# Patient Record
Sex: Female | Born: 1970 | Race: White | Hispanic: No | Marital: Married | Smoking: Never smoker
Health system: Southern US, Community
[De-identification: ages and names within clinical notes are randomized; demographics above are authoritative.]

## PROBLEM LIST (undated history)

## (undated) DIAGNOSIS — J189 Pneumonia, unspecified organism: Secondary | ICD-10-CM

## (undated) DIAGNOSIS — Z9889 Other specified postprocedural states: Secondary | ICD-10-CM

## (undated) DIAGNOSIS — H539 Unspecified visual disturbance: Secondary | ICD-10-CM

## (undated) DIAGNOSIS — R112 Nausea with vomiting, unspecified: Secondary | ICD-10-CM

## (undated) HISTORY — DX: Unspecified visual disturbance: H53.9

## (undated) HISTORY — PX: HIP ARTHROSCOPY: SUR88

## (undated) HISTORY — PX: BREAST CYST ASPIRATION: SHX578

## (undated) HISTORY — PX: APPENDECTOMY: SHX54

---

## 2016-02-04 ENCOUNTER — Telehealth: Payer: Self-pay | Admitting: Neurology

## 2016-02-04 ENCOUNTER — Encounter: Payer: Self-pay | Admitting: Neurology

## 2016-02-04 ENCOUNTER — Ambulatory Visit (INDEPENDENT_AMBULATORY_CARE_PROVIDER_SITE_OTHER): Payer: Federal, State, Local not specified - PPO | Admitting: Neurology

## 2016-02-04 DIAGNOSIS — R4189 Other symptoms and signs involving cognitive functions and awareness: Secondary | ICD-10-CM | POA: Diagnosis not present

## 2016-02-04 DIAGNOSIS — R5383 Other fatigue: Secondary | ICD-10-CM | POA: Insufficient documentation

## 2016-02-04 DIAGNOSIS — R200 Anesthesia of skin: Secondary | ICD-10-CM | POA: Insufficient documentation

## 2016-02-04 NOTE — Telephone Encounter (Signed)
[facility]

## 2016-02-04 NOTE — Telephone Encounter (Signed)
Carrie/  [facility] called needs order faxed to them . Fax [phone], phone [phone]

## 2016-02-04 NOTE — Progress Notes (Signed)
[facility]  PATIENT: Sheena Jarvis DOB: 03-02-1971  REFERRING DOCTOR OR PCP:  Dr. Hebert Soho (Fax [phone]) SOURCE: Patient, husband, notes from Dr. Ty Hilts.   MRI images will be personally reviewed after they are performed later today.  _________________________________   HISTORICAL  CHIEF COMPLAINT:  Chief Complaint  Patient presents with  . Cognitive Disturbance    Sts. 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  Sts. she has right sided numbness, difficulty with balance. Husband sts. he has noticed she tends to bob her head. Looking back, she can pinpoint one other episode, in Jan 2017.  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  Sts. they were hiking in the heat at the time.  Weakness, fatigue resolved after a couple of days./fim    HISTORY OF PRESENT ILLNESS:  I had the pleasure seeing you patient, Sheena Jarvis, at [facility] for neurologic consultation regarding her cognitive disturbances and other neurologic concerns.  In January, when in [place], she had a few days of right leg weakness and fatigue.   She fell she completely recovered and was fine until more recently.   She had shingles over the summer.    She has reported a lot of fatigue since August.   She did a lot of travelling in [place], [place] and [place] and [place] in 2 trips the past few months.   After her second trip, she had much worse fatigue and was found to have anemia.   Most of the travel was in cities not in rural areas.   Most of her eating was in restaurants though she did have some street food in [place] and [place] in [place].   She had some abdominal discomfort and diarrhea for a day in [place].   She had no fevers.   She returned home and continued to have more fatigue than usual.  Since 01/15/16, she has felt worse and has had  reduced balance and right sided symptoms.    She recalls that morning she couldn't focus and felt confused having trouble operating the toaster.  The mental fog has persisted.  Then last week, she began to have numbness in her right side and her fatigue was even worse, prompting her to stay in bed. Currently,  numbness is in the right thigh, right shoulder region and right face.   She feels slightly weaker but in a nonspecific manner.   She denies any change in bladder.  Gait seems slower and she feels she is more guarded.   No actual falls.     She has had a resting tremor in her head.     Also, her vision worsened in August and she needed other people to read things off in the distance.     She has taken some supplements for anemia.    She has been seeing a Functional Medicine doctor lately and was placed on supplements but no chelation or other therapies.    She has seen her eye doctor who made minimal correction changes but felt her eyes were otherwise unchanged.    She has high normal intraocular pressures but is not on glaucoma drops.     She had a deer tick in August and had a small rash at the bite but no rash elsewhere.    Lyme titer testing was negative in July.  She has not had a brain MRI yet.   I reviewed labs form her PCP.  EBV showed history of infection in the past, Lyme was negative. HSV was negative..   She is being evaluated for gallbladder issues.    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      REVIEW OF SYSTEMS: Constitutional: No fevers, chills, sweats, or change in appetite Eyes: No visual changes, double vision, eye pain.   She has borderline glaucoma Ear, nose and throat: No hearing loss, ear pain, nasal congestion, sore throat Cardiovascular: No chest pain, palpitations.   She had SVT in the past.   Respiratory: No shortness of breath at rest or with exertion.   No wheezes GastrointestinaI: No nausea, vomiting, diarrhea,  abdominal pain, fecal incontinence Genitourinary: No dysuria, urinary retention or frequency.  No nocturia. Musculoskeletal: No neck pain, back pain Integumentary: No rash, pruritus, skin lesions Neurological: as above Psychiatric: No depression at this time.  No anxiety Endocrine: No palpitations, diaphoresis, change in appetite, change in weigh or increased thirst Hematologic/Lymphatic: No anemia, purpura, petechiae. Allergic/Immunologic: No itchy/runny eyes, nasal congestion, recent allergic reactions, rashes  ALLERGIES: Not on File  HOME MEDICATIONS:  Current Outpatient Prescriptions:  .  Ascorbic Acid (VITAMIN C) POWD, Take 2,569 mg by mouth., Disp: , Rfl:  .  DIGESTIVE ENZYMES PO, Take by mouth., Disp: , Rfl:  .  FERROUS BISGLYCINATE CHELATE PO, Take by mouth., Disp: , Rfl:  .  MAGNESIUM MALATE PO, Take by mouth., Disp: , Rfl:  .  medium chain triglycerides oil (MCT OIL) OIL, Take by mouth., Disp: , Rfl:  .  Milk Thistle 175 MG CAPS, Take by mouth., Disp: , Rfl:  .  Multiple Vitamin (MULTI-VITAMINS) TABS, Take by mouth., Disp: , Rfl:  .  OVER THE COUNTER MEDICATION, Take by mouth., Disp: , Rfl:  .  Resveratrol 250 MG CAPS, Take by mouth., Disp: , Rfl:  .  TURMERIC PO, Take by mouth., Disp: , Rfl:   PAST MEDICAL HISTORY: No past medical history on file.  PAST SURGICAL HISTORY: No past surgical history on file.  FAMILY HISTORY: No family history on file.  SOCIAL HISTORY:  Social History   Social History  . Marital status: Married    Spouse name: N/A  . Number of children: N/A  . Years of education: N/A   Occupational History  . Not on file.   Social History Main Topics  . Smoking status: Not on file  . Smokeless tobacco: Not on file  . Alcohol use Not on file  . Drug use: Unknown  . Sexual activity: Not on file   Other Topics Concern  . Not on file   Social History Narrative  . No narrative on file     PHYSICAL EXAM  Vitals:   02/04/16 1400    BP: 110/64  Pulse: 64  Resp: 14  Weight: 121 lb 8 oz (55.1 kg)  Height: 5\' 6"  (1.676 m)    Body mass index is 19.61 kg/m.   General: The patient is well-developed and well-nourished and in no acute distress  Eyes:  Funduscopic exam shows normal optic discs and retinal vessels.  Neck: The neck is supple, no carotid bruits are noted.  The neck is nontender.  Cardiovascular: The heart has a regular rate and rhythm with a normal S1 and S2. There were no murmurs, gallops or rubs. Lungs are clear to auscultation.  Skin: Extremities are  without significant edema.  Musculoskeletal:  Back is nontender  Neurologic Exam  Mental status: The patient is alert and oriented x 3 at the time of the examination. The patient has apparent normal recent and remote memory, with an apparently normal attention span and concentration ability.   Speech is normal.  Cranial nerves: Extraocular movements are full. Pupils are equal, round, and reactive to light and accomodation.  Visual fields are full.  Facial symmetry is present. There is good facial sensation to soft touch bilaterally.Facial strength is normal.  Trapezius and sternocleidomastoid strength is normal. No dysarthria is noted.  The tongue is midline, and the patient has symmetric elevation of the soft palate. No obvious hearing deficits are noted.  Motor:  Head bob tremor but not in hands.   Muscle bulk is normal.   Tone is normal. Strength is  5 / 5 in all 4 extremities.   Sensory: Sensory testing is intact to vibration sensation in all 4 extremities.   She has decreased touch sensation in the anterolateral right thigh and also in the right shoulder region.  Coordination: Cerebellar testing reveals good finger-nose-finger and heel-to-shin bilaterally.  Gait and station: Station is normal.   Gait is normal. Tandem gait is mildly wide. Romberg is negative.   Reflexes: Deep tendon reflexes are 3+ and symmetric bilaterally.   Plantar responses  are flexor.    DIAGNOSTIC DATA (LABS, IMAGING, TESTING) - I reviewed patient records, labs, notes, testing and imaging myself where available.      ASSESSMENT AND PLAN  Numbness - Plan: Lyme disease, western blot, Heavy Metals Profile, Urine, CBC with Differential/Platelet, MR Brain W Wo Contrast  Cognitive changes - Plan: Lyme disease, western blot, Heavy Metals Profile, Urine, CBC with Differential/Platelet, MR Brain W Wo Contrast  Other fatigue - Plan: Lyme disease, western blot, Heavy Metals Profile, Urine, CBC with Differential/Platelet   In summary, Sheena Jarvis is a 45 year old woman who had the recent onset of right-sided numbness who has also noted an increased "mental fog" over the last 2 weeks. Given her very strong family history of MS, and her symptoms, we need to check an MRI of the brain with and without contrast to make sure that there is not evidence of demyelination. Additionally, with her traveling this will help to rule out some ID causes of neurologic symptoms.    Additionally because of the cognitive changes, fatigue and anemia we will check 24-hour urine for heavy metals. She had a tic bite several months ago and I will check a Lyme titer western blot.     We will try to set up the MRI to be done in the next day or 2 and further evaluation (such as lumbar puncture) may be necessary based on results. Her tremor is mild and probably does not have to be treated at this point. However, if the evaluation is negative and she wishes to try a medication we can initiate low-dose beta blocker.     Follow-up will be better based on the results of the studies.  Thank you for asking me to see Sheena Jarvis for a neurologic consultation. Please let me know if I can be of further assistance with her or other patients in the future.   Bettymae Yott A. Epimenio Foot, MD, PhD 02/04/2016, 2:08 PM Certified in Neurology, Clinical Neurophysiology, Sleep Medicine, Pain Medicine and  Neuroimaging  [facility] [address] [place], [place] [address] [phone]

## 2016-02-05 ENCOUNTER — Telehealth: Payer: Self-pay | Admitting: *Deleted

## 2016-02-05 ENCOUNTER — Telehealth: Payer: Self-pay | Admitting: Neurology

## 2016-02-05 MED ORDER — AZITHROMYCIN 250 MG PO TABS: ORAL_TABLET | ORAL | 0 refills | Status: DC

## 2016-02-05 NOTE — Telephone Encounter (Signed)
I have spoken with Sheena Jarvis this morning, and per RAS, advised that MRI brain done yesterday shows no signs of MS.  The only noted abnormality is some sinusitis.  She verbalized understanding of same.  Per RAS, I offered a Z-pack to treat sinusitis if she is symptomatic.  [facility] [place]  Rx. has been escribed/fim

## 2016-02-05 NOTE — Progress Notes (Signed)
NOTES FROM TODAY'S OV HAVE BEEN FAXED TO DR. BIANCA ROSSO FAX# [phone], WITH FAX CONFIRMATION RECEIVED/fim

## 2016-02-05 NOTE — Telephone Encounter (Signed)
I have spoken with Terri at [facility].  She sts. pt's MRI was normal; sts. images should be available in PACS for RAS to view/fim

## 2016-02-05 NOTE — Telephone Encounter (Signed)
Pt's husband called said he is wanting to know if the urine sample could be dropped [place]. Faith was skyped and relayed [facility]. He also had questions about pt's MRI and Faith was able to take the call

## 2016-02-05 NOTE — Telephone Encounter (Signed)
I have spoken with pt's husband and advised 24 hr. urine should be dropped off in our office/fim

## 2016-02-06 LAB — LYME DISEASE, WESTERN BLOT
IGG P30 AB.: ABSENT
IGG P45 AB.: ABSENT
IGG P58 AB.: ABSENT
IGG P66 AB.: ABSENT
IGG P93 AB.: ABSENT
IGM P23 AB.: ABSENT
IgG P18 Ab.: ABSENT
IgG P23 Ab.: ABSENT
IgG P28 Ab.: ABSENT
IgG P39 Ab.: ABSENT
IgG P41 Ab.: ABSENT
IgM P39 Ab.: ABSENT
IgM P41 Ab.: ABSENT
Lyme IgG Wb: NEGATIVE
Lyme IgM Wb: NEGATIVE

## 2016-02-06 LAB — CBC WITH DIFFERENTIAL/PLATELET
BASOS: 0 %
Basophils Absolute: 0 10*3/uL (ref 0.0–0.2)
EOS (ABSOLUTE): 0.4 10*3/uL (ref 0.0–0.4)
EOS: 5 %
HEMATOCRIT: 38.9 % (ref 34.0–46.6)
HEMOGLOBIN: 11.9 g/dL (ref 11.1–15.9)
IMMATURE GRANULOCYTES: 0 %
Immature Grans (Abs): 0 10*3/uL (ref 0.0–0.1)
Lymphocytes Absolute: 1.3 10*3/uL (ref 0.7–3.1)
Lymphs: 18 %
MCH: 25.5 pg — ABNORMAL LOW (ref 26.6–33.0)
MCHC: 30.6 g/dL — AB (ref 31.5–35.7)
MCV: 84 fL (ref 79–97)
MONOCYTES: 5 %
MONOS ABS: 0.4 10*3/uL (ref 0.1–0.9)
NEUTROS PCT: 72 %
Neutrophils Absolute: 5.5 10*3/uL (ref 1.4–7.0)
Platelets: 204 10*3/uL (ref 150–379)
RBC: 4.66 x10E6/uL (ref 3.77–5.28)
RDW: 22.2 % — ABNORMAL HIGH (ref 12.3–15.4)
WBC: 7.6 10*3/uL (ref 3.4–10.8)

## 2016-02-07 ENCOUNTER — Encounter: Payer: Self-pay | Admitting: Neurology

## 2016-02-08 ENCOUNTER — Other Ambulatory Visit: Payer: Self-pay | Admitting: Neurology

## 2016-02-08 ENCOUNTER — Telehealth: Payer: Self-pay | Admitting: *Deleted

## 2016-02-08 LAB — HEAVY METALS PROFILE, URINE
ARSENIC UR: NOT DETECTED ug/L (ref 0–50)
ARSENIC(INORGANIC), U: NOT DETECTED ug/L (ref 0–19)
Creatinine(Crt),U: 0.56 g/L (ref 0.30–3.00)
LEAD RANDOM URINE: NOT DETECTED ug/L (ref 0–49)
Mercury, Ur: NOT DETECTED ug/L (ref 0–19)

## 2016-02-08 MED ORDER — METOPROLOL SUCCINATE ER 25 MG PO TB24: 25.0000 mg | ORAL_TABLET | Freq: Every day | ORAL | 3 refills | Status: DC

## 2016-02-08 NOTE — Telephone Encounter (Signed)
Results emailed to pt. via mychart/fim

## 2016-02-08 NOTE — Telephone Encounter (Signed)
-----   Message from Asa Lenteichard A Sater, MD sent at 02/08/2016  8:24 AM EDT ----- We got her results. The Lyme titer was negative. The heavy metal profile was negative.      We can mail her or fax to her the results so she can share with her other doctors.  If she wants to treat the tremor, we can try metoprolol 25 mg daily

## 2018-01-14 ENCOUNTER — Other Ambulatory Visit: Payer: Self-pay | Admitting: Obstetrics

## 2018-01-14 DIAGNOSIS — N644 Mastodynia: Secondary | ICD-10-CM

## 2018-01-25 ENCOUNTER — Other Ambulatory Visit: Payer: Self-pay

## 2018-02-02 ENCOUNTER — Ambulatory Visit
Admission: RE | Admit: 2018-02-02 | Discharge: 2018-02-02 | Disposition: A | Discharge status: Home / Self Care | Payer: Federal, State, Local not specified - PPO | Source: Ambulatory Visit | Attending: Obstetrics | Admitting: Obstetrics

## 2018-02-02 DIAGNOSIS — N644 Mastodynia: Secondary | ICD-10-CM

## 2018-05-10 ENCOUNTER — Encounter (HOSPITAL_COMMUNITY): Payer: Self-pay | Admitting: Emergency Medicine

## 2018-05-10 ENCOUNTER — Ambulatory Visit (HOSPITAL_COMMUNITY)
Admission: EM | Admit: 2018-05-10 | Discharge: 2018-05-10 | Disposition: A | Discharge status: Home / Self Care | Payer: Federal, State, Local not specified - PPO | Attending: Internal Medicine | Admitting: Internal Medicine

## 2018-05-10 DIAGNOSIS — J019 Acute sinusitis, unspecified: Secondary | ICD-10-CM

## 2018-05-10 MED ORDER — DOXYCYCLINE HYCLATE 100 MG PO CAPS: 100.0000 mg | ORAL_CAPSULE | Freq: Two times a day (BID) | ORAL | 0 refills | Status: AC

## 2018-05-10 MED ORDER — BENZONATATE 200 MG PO CAPS: 200.0000 mg | ORAL_CAPSULE | Freq: Three times a day (TID) | ORAL | 0 refills | Status: AC | PRN

## 2018-05-10 MED ORDER — PREDNISONE 50 MG PO TABS: 50.0000 mg | ORAL_TABLET | Freq: Every day | ORAL | 0 refills | Status: AC

## 2018-05-10 NOTE — ED Provider Notes (Signed)
[facility]    CSN: [id number] Arrival date & time: 05/10/18  1306     History   Chief Complaint Chief Complaint  Patient presents with  . URI    appt 1315    HPI Sheena Ladell HeadsRachelle Brocious is a 48 y.o. female no contributing past medical history presenting today for evaluation of URI symptoms.  Patient states that she has had nasal congestion and cough.  Symptoms have been going on for approximately 1 month.  She has been using over-the-counter remedies without relief which have included Nettie pot, hot tea, Mucinex, NSAIDs, Zyrtec.  She has developed laryngitis in has lost her voice.  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  She denies any fevers.  Her son is sick with pneumonia at this time.  HPI  Past Medical History:  Diagnosis Date  . Vision abnormalities     Patient Active Problem List   Diagnosis Date Noted  . Numbness 02/04/2016  . Cognitive changes 02/04/2016  . Other fatigue 02/04/2016    Past Surgical History:  Procedure Laterality Date  . APPENDECTOMY    . BREAST CYST ASPIRATION Left   . HIP ARTHROSCOPY      OB History   No obstetric history on file.      Home Medications    Prior to Admission medications   Medication Sig Start Date End Date Taking? Authorizing Provider  Ascorbic Acid (VITAMIN C) POWD Take 2,569 mg by mouth.    [provider]  azithromycin (ZITHROMAX Z-PAK) 250 MG tablet Take 2 tablets on day 1, then 1 tablet daily until gone. Patient not taking: Reported on 05/10/2018 02/05/16   Sater, Pearletha Furlichard A, MD  benzonatate (TESSALON) 200 MG capsule Take 1 capsule (200 mg total) by mouth 3 (three) times daily as needed for up to 7 days for cough. 05/10/18 05/17/18  ,  C, PA-C  DIGESTIVE ENZYMES PO Take by mouth.    [provider]  doxycycline (VIBRAMYCIN) 100 MG capsule Take 1 capsule (100 mg total) by mouth 2 (two) times daily for 10 days. 05/10/18 05/20/18   ,  C, PA-C  FERROUS BISGLYCINATE CHELATE PO Take by mouth.    [provider]  MAGNESIUM MALATE PO Take by mouth.    [provider]  medium chain triglycerides oil (MCT OIL) OIL Take by mouth.    [provider]  metoprolol succinate (TOPROL XL) 25 MG 24 hr tablet Take 1 tablet (25 mg total) by mouth daily. 02/08/16   Sater, Pearletha Furlichard A, MD  Milk Thistle 175 MG CAPS Take by mouth.    [provider]  Multiple Vitamin (MULTI-VITAMINS) TABS Take by mouth.    [provider]  OVER THE COUNTER MEDICATION Take by mouth.    [provider]  predniSONE (DELTASONE) 50 MG tablet Take 1 tablet (50 mg total) by mouth daily for 5 days. 05/10/18 05/15/18  ,  C, PA-C  Resveratrol 250 MG CAPS Take by mouth.    [provider]  TURMERIC PO Take by mouth.    [provider]    Family History Family History  Problem Relation Age of Onset  . Healthy Mother   . Heart disease Father   . Multiple sclerosis Brother   . Multiple sclerosis Maternal Aunt   . Breast cancer Maternal Grandmother     Social History Social History   Tobacco Use  . Smoking status: Never  Smoker  . Smokeless tobacco: Never Used  Substance Use Topics  . Alcohol use: Yes    Alcohol/week: 3.0 - 4.0 standard drinks    Types: 3 - 4 Glasses of wine per week  . Drug use: No     Allergies   Penicillins; Latex; and Sulfa antibiotics   Review of Systems Review of Systems  Constitutional: Positive for fatigue. Negative for activity change, appetite change, chills and fever.  HENT: Positive for congestion, rhinorrhea, sinus pressure and voice change. Negative for ear pain, sore throat and trouble swallowing.   Eyes: Negative for discharge and redness.  Respiratory: Positive for cough and chest tightness. Negative for shortness of breath.   Cardiovascular: Negative for chest pain.  Gastrointestinal: Negative for abdominal pain, diarrhea,  nausea and vomiting.  Musculoskeletal: Negative for myalgias.  Skin: Negative for rash.  Neurological: Negative for dizziness, light-headedness and headaches.     Physical Exam Triage Vital Signs ED Triage Vitals  Enc Vitals Group     BP 05/10/18 1331 117/72     Pulse Rate 05/10/18 1331 74     Resp 05/10/18 1331 18     Temp 05/10/18 1331 98.4 F (36.9 C)     Temp Source 05/10/18 1331 Oral     SpO2 05/10/18 1331 100 %     Weight --      Height --      Head Circumference --      Peak Flow --      Pain Score 05/10/18 1402 5     Pain Loc --      Pain Edu? --      Excl. in GC? --    No data found.  Updated Vital Signs BP 117/72 (BP Location: Right Arm)   Pulse 74   Temp 98.4 F (36.9 C) (Oral)   Resp 18   SpO2 100%   Visual Acuity Right Eye Distance:   Left Eye Distance:   Bilateral Distance:    Right Eye Near:   Left Eye Near:    Bilateral Near:     Physical Exam Vitals signs and nursing note reviewed.  Constitutional:      General: She is not in acute distress.    Appearance: She is well-developed.  HENT:     Head: Normocephalic and atraumatic.     Ears:     Comments: Bilateral ears without tenderness to palpation of external auricle, tragus and mastoid, EAC's without erythema or swelling, TM's with good bony landmarks and cone of light. Non erythematous.    Nose:     Comments: Nasal mucosa erythematous    Mouth/Throat:     Comments: Oral mucosa pink and moist, no tonsillar enlargement or exudate. Posterior pharynx patent and nonerythematous, no uvula deviation or swelling.  Hoarse voice Eyes:     Conjunctiva/sclera: Conjunctivae normal.  Neck:     Musculoskeletal: Neck supple.  Cardiovascular:     Rate and Rhythm: Normal rate and regular rhythm.     Heart sounds: No murmur.  Pulmonary:     Effort: Pulmonary effort is normal. No respiratory distress.     Breath sounds: Normal breath sounds.     Comments: Breathing comfortably at rest, CTABL, no  wheezing, rales or other adventitious sounds auscultated Abdominal:     Palpations: Abdomen is soft.     Tenderness: There is no abdominal tenderness.  Skin:    General: Skin is warm and dry.  Neurological:     Mental Status: She is  alert.      UC Treatments / Results  Labs (all labs ordered are listed, but only abnormal results are displayed) Labs Reviewed - No data to display  EKG None  Radiology No results found.  Procedures Procedures (including critical care time)  Medications Ordered in UC Medications - No data to display  Initial Impression / Assessment and Plan / UC Course  I have reviewed the triage vital signs and the nursing notes.  Pertinent labs & imaging results that were available during my care of the patient were reviewed by me and considered in my medical decision making (see chart for details).     URI symptoms x1 month, will treat for sinusitis.  Penicillin allergy, will provide doxycycline.  Continue Mucinex, Zyrtec.  Add in [place] as needed for cough.  5-day course of prednisone to help with inflammation in sinuses.  Push fluids, rest.Discussed strict return precautions. Patient verbalized understanding and is agreeable with plan.  Final Clinical Impressions(s) / UC Diagnoses   Final diagnoses:  Acute sinusitis with symptoms > 10 days     Discharge Instructions     Please begin doxycycline twice daily for the next 10 days Please use Tessalon as needed every 8 hours for cough Prednisone daily for the next 5 days.with inflammation in sinuses contributing to symptoms You may continue other over-the-counter medicines to further help with your congestion and headache   ED Prescriptions    Medication Sig Dispense Auth. Provider   doxycycline (VIBRAMYCIN) 100 MG capsule Take 1 capsule (100 mg total) by mouth 2 (two) times daily for 10 days. 20 capsule ,  C, PA-C   predniSONE (DELTASONE) 50 MG tablet Take 1 tablet (50 mg total) by  mouth daily for 5 days. 5 tablet ,  C, PA-C   benzonatate (TESSALON) 200 MG capsule Take 1 capsule (200 mg total) by mouth 3 (three) times daily as needed for up to 7 days for cough. 28 capsule ,  C, PA-C     Controlled Substance Prescriptions [place] Controlled Substance Registry consulted? Not Applicable   Lew Dawes, [place] 05/10/18 1523

## 2018-05-10 NOTE — Discharge Instructions (Signed)
Please begin doxycycline twice daily for the next 10 days Please use Tessalon as needed every 8 hours for cough Prednisone daily for the next 5 days.with inflammation in sinuses contributing to symptoms You may continue other over-the-counter medicines to further help with your congestion and headache

## 2018-05-10 NOTE — ED Triage Notes (Signed)
Pt here for URI sx x 1 month

## 2018-05-12 ENCOUNTER — Encounter (HOSPITAL_COMMUNITY): Payer: Self-pay

## 2018-05-12 ENCOUNTER — Ambulatory Visit (HOSPITAL_COMMUNITY)
Admission: EM | Admit: 2018-05-12 | Discharge: 2018-05-12 | Disposition: A | Discharge status: Home / Self Care | Payer: Federal, State, Local not specified - PPO | Attending: Family Medicine | Admitting: Family Medicine

## 2018-05-12 DIAGNOSIS — R05 Cough: Secondary | ICD-10-CM | POA: Insufficient documentation

## 2018-05-12 DIAGNOSIS — J0140 Acute pansinusitis, unspecified: Secondary | ICD-10-CM | POA: Diagnosis not present

## 2018-05-12 DIAGNOSIS — R059 Cough, unspecified: Secondary | ICD-10-CM

## 2018-05-12 MED ORDER — ALBUTEROL SULFATE HFA 108 (90 BASE) MCG/ACT IN AERS: 1.0000 | INHALATION_SPRAY | Freq: Four times a day (QID) | RESPIRATORY_TRACT | 0 refills | Status: DC | PRN

## 2018-05-12 MED ORDER — HYDROCODONE-HOMATROPINE 5-1.5 MG/5ML PO SYRP: 5.0000 mL | ORAL_SOLUTION | Freq: Four times a day (QID) | ORAL | 0 refills | Status: DC | PRN

## 2018-05-12 MED ORDER — FLUTICASONE PROPIONATE 50 MCG/ACT NA SUSP: 2.0000 | Freq: Every day | NASAL | 0 refills | Status: DC

## 2018-05-12 MED ORDER — IPRATROPIUM BROMIDE 0.06 % NA SOLN: 2.0000 | Freq: Four times a day (QID) | NASAL | 0 refills | Status: DC

## 2018-05-12 NOTE — ED Provider Notes (Signed)
[facility]    CSN: [id number] Arrival date & time: 05/12/18  1002     History   Chief Complaint Chief Complaint  Patient presents with  . Cough    HPI Sheena Jarvis is a 48 y.o. female.   48 year old female comes in with increased cough and shortness of breath after being seen 2 days ago for sinusitis.  States she has started doxycycline and prednisone with good relief.  However, for the past few nights, woke up feeling like she cannot breathe.  States she tried to use her son's inhaler, but they realize it is expired.  She has not noticed any wheezing, and does not feel short of breath during the day.  She denies fever, chills, night sweats.  Still has rhinorrhea, nasal congestion.  Has never needed albuterol inhalers.  Never smoker.     Past Medical History:  Diagnosis Date  . Vision abnormalities     Patient Active Problem List   Diagnosis Date Noted  . Numbness 02/04/2016  . Cognitive changes 02/04/2016  . Other fatigue 02/04/2016    Past Surgical History:  Procedure Laterality Date  . APPENDECTOMY    . BREAST CYST ASPIRATION Left   . HIP ARTHROSCOPY      OB History   No obstetric history on file.      Home Medications    Prior to Admission medications   Medication Sig Start Date End Date Taking? Authorizing Provider  albuterol (PROVENTIL HFA;VENTOLIN HFA) 108 (90 Base) MCG/ACT inhaler Inhale 1-2 puffs into the lungs every 6 (six) hours as needed for wheezing or shortness of breath. 05/12/18   Belinda Fisher, PA-C  Ascorbic Acid (VITAMIN C) POWD Take 2,569 mg by mouth.    [provider]  azithromycin (ZITHROMAX Z-PAK) 250 MG tablet Take 2 tablets on day 1, then 1 tablet daily until gone. Patient not taking: Reported on 05/10/2018 02/05/16   Sater, Pearletha Furl, MD  benzonatate (TESSALON) 200 MG capsule Take 1 capsule (200 mg total) by mouth 3 (three) times daily as needed for up to 7 days for cough. 05/10/18 05/17/18  Wieters, Hallie C, PA-C    DIGESTIVE ENZYMES PO Take by mouth.    [provider]  doxycycline (VIBRAMYCIN) 100 MG capsule Take 1 capsule (100 mg total) by mouth 2 (two) times daily for 10 days. 05/10/18 05/20/18  Wieters, Hallie C, PA-C  FERROUS BISGLYCINATE CHELATE PO Take by mouth.    [provider]  fluticasone (FLONASE) 50 MCG/ACT nasal spray Place 2 sprays into both nostrils daily. 05/12/18   Cathie Hoops, Shaquill Iseman V, PA-C  HYDROcodone-homatropine (HYCODAN) 5-1.5 MG/5ML syrup Take 5 mLs by mouth every 6 (six) hours as needed for cough. 05/12/18   Cathie Hoops, Ilyaas Musto V, PA-C  ipratropium (ATROVENT) 0.06 % nasal spray Place 2 sprays into both nostrils 4 (four) times daily. 05/12/18   Tyreke Kaeser V, PA-C  MAGNESIUM MALATE PO Take by mouth.    [provider]  medium chain triglycerides oil (MCT OIL) OIL Take by mouth.    [provider]  Milk Thistle 175 MG CAPS Take by mouth.    [provider]  Multiple Vitamin (MULTI-VITAMINS) TABS Take by mouth.    [provider]  OVER THE COUNTER MEDICATION Take by mouth.    [provider]  predniSONE (DELTASONE) 50 MG tablet Take 1 tablet (50 mg total) by mouth daily for 5 days. 05/10/18 05/15/18  Wieters, Hallie C, PA-C  Resveratrol 250 MG CAPS  Take by mouth.    [provider]  TURMERIC PO Take by mouth.    [provider]    Family History Family History  Problem Relation Age of Onset  . Healthy Mother   . Heart disease Father   . Multiple sclerosis Brother   . Multiple sclerosis Maternal Aunt   . Breast cancer Maternal Grandmother     Social History Social History   Tobacco Use  . Smoking status: Never Smoker  . Smokeless tobacco: Never Used  Substance Use Topics  . Alcohol use: Yes    Alcohol/week: 3.0 - 4.0 standard drinks    Types: 3 - 4 Glasses of wine per week  . Drug use: No     Allergies   Penicillins; Latex; and Sulfa antibiotics   Review of Systems Review of Systems  Reason unable to perform ROS: See  HPI as above.     Physical Exam Triage Vital Signs ED Triage Vitals  Enc Vitals Group     BP 05/12/18 1018 114/78     Pulse Rate 05/12/18 1018 82     Resp 05/12/18 1018 16     Temp 05/12/18 1018 98.2 F (36.8 C)     Temp src --      SpO2 05/12/18 1018 100 %     Weight --      Height --      Head Circumference --      Peak Flow --      Pain Score 05/12/18 1016 0     Pain Loc --      Pain Edu? --      Excl. in GC? --    No data found.  Updated Vital Signs BP 114/78   Pulse 82   Temp 98.2 F (36.8 C)   Resp 16   LMP  (LMP Unknown)   SpO2 100%   Physical Exam Constitutional:      General: She is not in acute distress.    Appearance: She is well-developed. She is not ill-appearing, toxic-appearing or diaphoretic.  HENT:     Head: Normocephalic and atraumatic.     Right Ear: Tympanic membrane, ear canal and external ear normal. Tympanic membrane is not erythematous or bulging.     Left Ear: Tympanic membrane, ear canal and external ear normal. Tympanic membrane is not erythematous or bulging.     Nose:     Right Sinus: Maxillary sinus tenderness and frontal sinus tenderness present.     Left Sinus: Maxillary sinus tenderness and frontal sinus tenderness present.     Mouth/Throat:     Pharynx: Uvula midline.  Eyes:     Conjunctiva/sclera: Conjunctivae normal.     Pupils: Pupils are equal, round, and reactive to light.  Neck:     Musculoskeletal: Normal range of motion and neck supple.  Cardiovascular:     Rate and Rhythm: Normal rate and regular rhythm.     Heart sounds: Normal heart sounds. No murmur. No friction rub. No gallop.   Pulmonary:     Effort: Pulmonary effort is normal. No respiratory distress.     Breath sounds: Normal breath sounds. No stridor. No decreased breath sounds, wheezing, rhonchi or rales.  Lymphadenopathy:     Cervical: No cervical adenopathy.  Skin:    General: Skin is warm and dry.  Neurological:     Mental Status: She is alert and  oriented to person, place, and time.  Psychiatric:  Behavior: Behavior normal.        Judgment: Judgment normal.      UC Treatments / Results  Labs (all labs ordered are listed, but only abnormal results are displayed) Labs Reviewed - No data to display  EKG None  Radiology No results found.  Procedures Procedures (including critical care time)  Medications Ordered in UC Medications - No data to display  Initial Impression / Assessment and Plan / UC Course  I have reviewed the triage vital signs and the nursing notes.  Pertinent labs & imaging results that were available during my care of the patient were reviewed by me and considered in my medical decision making (see chart for details).    Patient's exam reassuring.  Lungs clear to auscultation bilaterally without adventitious lung sounds.  Patient is speaking in full sentences without difficulty.  Afebrile without tachycardia, tachypnea, hypoxia.  Will have patient continue doxycycline and prednisone.  Will provide albuterol inhaler as needed.  Other symptomatic treatment discussed.  Push fluids.  Return precautions given.  Patient expresses understanding and agrees to plan.  Final Clinical Impressions(s) / UC Diagnoses   Final diagnoses:  Acute non-recurrent pansinusitis  Cough    ED Prescriptions    Medication Sig Dispense Auth. Provider   albuterol (PROVENTIL HFA;VENTOLIN HFA) 108 (90 Base) MCG/ACT inhaler Inhale 1-2 puffs into the lungs every 6 (six) hours as needed for wheezing or shortness of breath. 1 Inhaler Rameen Quinney V, PA-C   ipratropium (ATROVENT) 0.06 % nasal spray Place 2 sprays into both nostrils 4 (four) times daily. 15 mL Kyiah Canepa V, PA-C   fluticasone (FLONASE) 50 MCG/ACT nasal spray Place 2 sprays into both nostrils daily. 1 g Roizy Harold V, PA-C   HYDROcodone-homatropine (HYCODAN) 5-1.5 MG/5ML syrup Take 5 mLs by mouth every 6 (six) hours as needed for cough. 120 mL Linward Headland V, PA-C     Controlled  Substance Prescriptions [place] Controlled Substance Registry consulted? Yes, I have consulted the [place] Controlled Substances Registry for this patient, and feel the risk/benefit ratio today is favorable for proceeding with this prescription for a controlled substance.   Belinda Fisher, PA-C 05/12/18 1320

## 2018-05-12 NOTE — ED Triage Notes (Signed)
Pt presents with complaints of sinus infection that she is being treated for x 2 days with antibiotics. Pt reports that she had an episode of coughing so much that she couldn't get her breath and had to use her sons inhaler. Pt is in no distress at this time. Breathing is unlabored.

## 2018-05-12 NOTE — Discharge Instructions (Signed)
Continue doxycycline and prednisone as directed. Albuterol as needed for wheezing/shortness of breath. Hycodan for cough, this may make you drowsy, do not drive or make big decisions until you know your reaction to medication. Start flonase, atrovent nasal spray for nasal congestion/drainage. You can use over the counter nasal saline rinse such as neti pot for nasal congestion. Keep hydrated, your urine should be clear to pale yellow in color. Tylenol/motrin for fever and pain. Monitor for any worsening of symptoms, chest pain, shortness of breath, wheezing, swelling of the throat, follow up for reevaluation.   For sore throat/cough try using a honey-based tea. Use 3 teaspoons of honey with juice squeezed from half lemon. Place shaved pieces of Lenia into 1/2-1 cup of water and warm over stove top. Then mix the ingredients and repeat every 4 hours as needed.

## 2018-07-08 DIAGNOSIS — K080 Exfoliation of teeth due to systemic causes: Secondary | ICD-10-CM | POA: Diagnosis not present

## 2018-11-01 DIAGNOSIS — H04123 Dry eye syndrome of bilateral lacrimal glands: Secondary | ICD-10-CM | POA: Diagnosis not present

## 2018-12-09 ENCOUNTER — Other Ambulatory Visit: Payer: Self-pay | Admitting: Obstetrics

## 2018-12-09 DIAGNOSIS — N6320 Unspecified lump in the left breast, unspecified quadrant: Secondary | ICD-10-CM

## 2018-12-09 DIAGNOSIS — Z681 Body mass index (BMI) 19 or less, adult: Secondary | ICD-10-CM | POA: Diagnosis not present

## 2018-12-20 ENCOUNTER — Ambulatory Visit
Admission: RE | Admit: 2018-12-20 | Discharge: 2018-12-20 | Disposition: A | Discharge status: Home / Self Care | Payer: Federal, State, Local not specified - PPO | Source: Ambulatory Visit | Attending: Obstetrics | Admitting: Obstetrics

## 2018-12-20 ENCOUNTER — Other Ambulatory Visit: Payer: Self-pay | Admitting: Obstetrics

## 2018-12-20 ENCOUNTER — Other Ambulatory Visit: Payer: Self-pay

## 2018-12-20 DIAGNOSIS — N6320 Unspecified lump in the left breast, unspecified quadrant: Secondary | ICD-10-CM

## 2018-12-20 DIAGNOSIS — R922 Inconclusive mammogram: Secondary | ICD-10-CM | POA: Diagnosis not present

## 2018-12-21 ENCOUNTER — Ambulatory Visit
Admission: RE | Admit: 2018-12-21 | Discharge: 2018-12-21 | Disposition: A | Discharge status: Home / Self Care | Payer: Federal, State, Local not specified - PPO | Source: Ambulatory Visit | Attending: Obstetrics | Admitting: Obstetrics

## 2018-12-21 DIAGNOSIS — N6332 Unspecified lump in axillary tail of the left breast: Secondary | ICD-10-CM | POA: Diagnosis not present

## 2018-12-21 DIAGNOSIS — N6320 Unspecified lump in the left breast, unspecified quadrant: Secondary | ICD-10-CM

## 2018-12-21 HISTORY — PX: BREAST BIOPSY: SHX20

## 2018-12-27 ENCOUNTER — Other Ambulatory Visit: Payer: Self-pay | Admitting: General Surgery

## 2018-12-27 DIAGNOSIS — N6320 Unspecified lump in the left breast, unspecified quadrant: Secondary | ICD-10-CM

## 2018-12-27 DIAGNOSIS — Z803 Family history of malignant neoplasm of breast: Secondary | ICD-10-CM | POA: Diagnosis not present

## 2018-12-27 DIAGNOSIS — R922 Inconclusive mammogram: Secondary | ICD-10-CM | POA: Diagnosis not present

## 2018-12-29 ENCOUNTER — Ambulatory Visit
Admission: RE | Admit: 2018-12-29 | Discharge: 2018-12-29 | Disposition: A | Discharge status: Home / Self Care | Payer: Federal, State, Local not specified - PPO | Source: Ambulatory Visit | Attending: General Surgery | Admitting: General Surgery

## 2018-12-29 ENCOUNTER — Telehealth: Payer: Self-pay | Admitting: General Surgery

## 2018-12-29 ENCOUNTER — Other Ambulatory Visit: Payer: Self-pay

## 2018-12-29 DIAGNOSIS — N644 Mastodynia: Secondary | ICD-10-CM | POA: Diagnosis not present

## 2018-12-29 DIAGNOSIS — N6320 Unspecified lump in the left breast, unspecified quadrant: Secondary | ICD-10-CM

## 2018-12-29 DIAGNOSIS — Z803 Family history of malignant neoplasm of breast: Secondary | ICD-10-CM | POA: Diagnosis not present

## 2018-12-29 MED ORDER — GADOBUTROL 1 MMOL/ML IV SOLN
5.0000 mL | Freq: Once | INTRAVENOUS | Status: AC | PRN
  Administered 2018-12-29: 5 mL via INTRAVENOUS

## 2018-12-29 NOTE — Telephone Encounter (Signed)
Left message about additional biopsy on the left, and I asked her to give me a call back.

## 2018-12-30 ENCOUNTER — Other Ambulatory Visit: Payer: Self-pay | Admitting: General Surgery

## 2018-12-30 DIAGNOSIS — R9389 Abnormal findings on diagnostic imaging of other specified body structures: Secondary | ICD-10-CM

## 2018-12-31 ENCOUNTER — Other Ambulatory Visit: Payer: Federal, State, Local not specified - PPO

## 2019-01-12 ENCOUNTER — Other Ambulatory Visit: Payer: Self-pay

## 2019-01-12 ENCOUNTER — Ambulatory Visit
Admission: RE | Admit: 2019-01-12 | Discharge: 2019-01-12 | Disposition: A | Discharge status: Home / Self Care | Payer: Federal, State, Local not specified - PPO | Source: Ambulatory Visit | Attending: General Surgery | Admitting: General Surgery

## 2019-01-12 DIAGNOSIS — N6322 Unspecified lump in the left breast, upper inner quadrant: Secondary | ICD-10-CM | POA: Diagnosis not present

## 2019-01-12 DIAGNOSIS — R928 Other abnormal and inconclusive findings on diagnostic imaging of breast: Secondary | ICD-10-CM | POA: Diagnosis not present

## 2019-01-12 DIAGNOSIS — R9389 Abnormal findings on diagnostic imaging of other specified body structures: Secondary | ICD-10-CM

## 2019-01-12 DIAGNOSIS — N6012 Diffuse cystic mastopathy of left breast: Secondary | ICD-10-CM | POA: Diagnosis not present

## 2019-01-12 HISTORY — PX: BREAST BIOPSY: SHX20

## 2019-01-12 MED ORDER — GADOBUTROL 1 MMOL/ML IV SOLN
5.0000 mL | Freq: Once | INTRAVENOUS | Status: AC | PRN
  Administered 2019-01-12: 5 mL via INTRAVENOUS

## 2019-01-14 ENCOUNTER — Other Ambulatory Visit: Payer: Self-pay | Admitting: General Surgery

## 2019-01-14 DIAGNOSIS — Z713 Dietary counseling and surveillance: Secondary | ICD-10-CM | POA: Diagnosis not present

## 2019-01-18 DIAGNOSIS — Z681 Body mass index (BMI) 19 or less, adult: Secondary | ICD-10-CM | POA: Diagnosis not present

## 2019-01-18 DIAGNOSIS — Z01419 Encounter for gynecological examination (general) (routine) without abnormal findings: Secondary | ICD-10-CM | POA: Diagnosis not present

## 2019-01-18 DIAGNOSIS — N926 Irregular menstruation, unspecified: Secondary | ICD-10-CM | POA: Diagnosis not present

## 2019-01-18 DIAGNOSIS — Z124 Encounter for screening for malignant neoplasm of cervix: Secondary | ICD-10-CM | POA: Diagnosis not present

## 2019-01-18 DIAGNOSIS — D509 Iron deficiency anemia, unspecified: Secondary | ICD-10-CM | POA: Diagnosis not present

## 2019-02-03 ENCOUNTER — Encounter (HOSPITAL_BASED_OUTPATIENT_CLINIC_OR_DEPARTMENT_OTHER): Payer: Self-pay | Admitting: *Deleted

## 2019-02-07 ENCOUNTER — Other Ambulatory Visit (HOSPITAL_COMMUNITY)
Admission: RE | Admit: 2019-02-07 | Discharge: 2019-02-07 | Disposition: A | Discharge status: Home / Self Care | Payer: Federal, State, Local not specified - PPO | Source: Ambulatory Visit | Attending: General Surgery | Admitting: General Surgery

## 2019-02-07 DIAGNOSIS — Z01818 Encounter for other preprocedural examination: Secondary | ICD-10-CM | POA: Insufficient documentation

## 2019-02-07 DIAGNOSIS — Z20828 Contact with and (suspected) exposure to other viral communicable diseases: Secondary | ICD-10-CM | POA: Diagnosis not present

## 2019-02-07 NOTE — H&P (Signed)
Sheena Jarvis Location: [facility] Patient #: [id number] DOB: Mar 27, 1971 Married / Language: English / Race: White Female   History of Present Illness The patient is a 48 year old female who presents with a complaint of Breast problems. Patient is a 48 year old female who is referred for consultation by Dr. Lyman Bishop for a left breast mass. Patient has felt prominent area of breast tissue in the axillary tail of her left breast for around 20 years. Mammograms have always been normal other than showing very dense breast tissue, so she did not think much of it. However over the last 6 months she has noted significant increase in size of this area. It has become much more mass-like and extends under the pec. She also has noted that it is become much more tender. She has gone from a C /D cup to a double D cup on that side. Her bra is poorly fitting at this point. She does not drink any caffeine. She eats minimal chocolate and does not drink black tea. She is not taking hormone replacement. She does have multiple members on her maternal grandmother side had breast cancer including her maternal grandmother. She is not sure how old they were but knows that they were not extremely young.   dx left mammogram/us 12/20/2018 CLINICAL DATA: 48 year old female presenting for evaluation of a palpable lump in the left axilla. She has had this lump for a couple of years, but states that the palpable area is larger, now standing into her axilla, and the breast itself has gone from a C cup to a DD, and is affecting her quality of life. She has family history of breast cancer in a maternal grandmother.  EXAM: DIGITAL DIAGNOSTIC LEFT MAMMOGRAM WITH CAD AND TOMO  ULTRASOUND LEFT BREAST  COMPARISON: Previous exam(s).  ACR Breast Density Category d: The breast tissue is extremely dense, which lowers the sensitivity of mammography.  FINDINGS: A BB has been placed at the palpable site  of concern over the left axilla. There is dense fibroglandular tissue deep to the palpable marker, as well as a few small benign-appearing lymph nodes, however no discrete masses are identified.  Mammographic images were processed with CAD.  On physical exam, there is a firm rubbery mobile palpable mass in the left axillary tail extending into the left axilla.  Targeted ultrasound is performed, showing dense fibroglandular tissue at the palpable site in the left axilla. No masses or suspicious areas of shadowing are identified. Normal lymph nodes are identified in the left axilla.  IMPRESSION: 1. There are no suspicious mammographic or targeted sonographic abnormalities at the palpable site of concern in the left axilla. However, the area is easily palpable, and given that the patient is confident this is enlarging, further evaluation is suggested.  RECOMMENDATION: 1. The patient is interested in surgical consultation given the discomfort an increase in size of the left breast. Therefore, ultrasound-guided biopsy is recommended for the palpable site in the left axillary tail/axilla prior to scheduling surgical consultation. The procedure has been scheduled for 12/21/2018 at 2:45 p.m.  I have discussed the findings and recommendations with the patient. Results were also provided in writing at the conclusion of the visit. If applicable, a reminder letter will be sent to the patient regarding the next appointment.  BI-RADS CATEGORY 4: Suspicious.  pathology 12/22/2018 Diagnosis Breast, left, needle core biopsy, axillary tail - FIBROVASCULAR AND ADIPOSE TISSUE WITH HEMORRHAGE.   Past Surgical History Appendectomy  Gallbladder Surgery - Laparoscopic  Hip Surgery  Right.  Diagnostic Studies History Colonoscopy  never Mammogram  within last year Pap Smear  1-5 years ago  Allergies Penicillins  Sulfa Drugs  Latex  Allergies Reconciled   Medication  History No Current Medications Medications Reconciled  Social History Alcohol use  Occasional alcohol use. No caffeine use  No drug use  Tobacco use  Never smoker.  Family History Arthritis  Father, Mother. Bleeding disorder  Family Members In General. Diabetes Mellitus  Father. Heart Disease  Father, Mother. Heart disease in female family member before age 47  Heart disease in female family member before age 47  Hypertension  Father, Mother. Melanoma  Father. Prostate Cancer  Father.  Pregnancy / Birth History Age at menarche  17 years. Gravida  3 Irregular periods  Length (months) of breastfeeding  12-24 Maternal age  47-35 Para  3  Other Problems Lump In Breast     Review of Systems All other systems negative  Vitals  Weight: 122.8 lb Height: 67in Body Surface Area: 1.64 m Body Mass Index: 19.23 kg/m  Temp.: 26F (Temporal)  Pulse: 99 (Regular)  BP: 108/62(Sitting, Left Arm, Standard)       Physical Exam General Mental Status-Alert. General Appearance-Consistent with stated age. Hydration-Well hydrated. Voice-Normal. Note: thin, healthy.   Head and Neck Head-normocephalic, atraumatic with no lesions or palpable masses. Trachea-midline. Thyroid Gland Characteristics - normal size and consistency.  Eye Eyeball - Bilateral-Extraocular movements intact. Sclera/Conjunctiva - Bilateral-No scleral icterus.  Chest and Lung Exam Chest and lung exam reveals -quiet, even and easy respiratory effort with no use of accessory muscles and on auscultation, normal breath sounds, no adventitious sounds and normal vocal resonance. Inspection Chest Wall - Normal. Back - normal.  Breast Note: dense bilaterally. left larger than right. minimal ptosis. bruising UOQ left breast in site of biopsy. oblong mass palpable 3.5x1.5 cm just lateral to pec border going into armpit. No nipple retraction or nipple  discharge. No LAD.   Cardiovascular Cardiovascular examination reveals -normal heart sounds, regular rate and rhythm with no murmurs and normal pedal pulses bilaterally.  Abdomen Inspection Inspection of the abdomen reveals - No Hernias. Palpation/Percussion Palpation and Percussion of the abdomen reveal - Soft, Non Tender, No Rebound tenderness, No Rigidity (guarding) and No hepatosplenomegaly. Auscultation Auscultation of the abdomen reveals - Bowel sounds normal.  Neurologic Neurologic evaluation reveals -alert and oriented x 3 with no impairment of recent or remote memory. Mental Status-Normal.  Musculoskeletal Global Assessment -Note: no gross deformities.  Normal Exam - Left-Upper Extremity Strength Normal and Lower Extremity Strength Normal. Normal Exam - Right-Upper Extremity Strength Normal and Lower Extremity Strength Normal.  Lymphatic Head & Neck  General Head & Neck Lymphatics: Bilateral - Description - Normal. Axillary  General Axillary Region: Bilateral - Description - Normal. Tenderness - Non Tender. Femoral & Inguinal  Generalized Femoral & Inguinal Lymphatics: Bilateral - Description - No Generalized lymphadenopathy.    Assessment & Plan  LEFT BREAST MASS (N63.20) Impression: This mass could certainly be a denser island of left breast tissue, but it is enlarging and painful. Will get MR to evaluate it better and to look at both breasts. She has density D and multiple family members with breast cancer.  If MRI shows other issues, will get additional biopsies. If nothing else is seen, will plan excision of this mass. I would plan to use axillary incision to minimize scarring. Should cancer be found, we will then treat as indicated.  The surgical procedure was  described to the patient. I discussed the incision type and location and that we would need radiology involved on with a wire or seed marker and/or sentinel node.  The risks and benefits  of the procedure were described to the patient and she wishes to proceed.  We discussed the risks bleeding, infection, damage to other structures, need for further procedures/surgeries. We discussed the risk of seroma. The patient was advised if the area in the breast in cancer, we may need to go back to surgery for additional tissue to obtain negative margins or for a lymph node biopsy. The patient was advised that these are the most common complications, but that others can occur as well. They were advised against taking aspirin or other anti-inflammatory agents/blood thinners the week before surgery.  Current Plans MR BREAST BILAT W CON (06269) (Evaluate left breast mass, dense breasts, left breast size change, family history of multiple individuals with breast cancer) Pt Education - [facility] Breast Biopsy HCI: discussed with patient and provided information.  DENSE BREASTS (R92.2) Impression: see above   FAMILY HISTORY OF BREAST CANCER (Z80.3) Impression: see above.

## 2019-02-08 LAB — NOVEL CORONAVIRUS, NAA (HOSP ORDER, SEND-OUT TO REF LAB; TAT 18-24 HRS): SARS-CoV-2, NAA: NOT DETECTED

## 2019-02-10 ENCOUNTER — Ambulatory Visit (HOSPITAL_BASED_OUTPATIENT_CLINIC_OR_DEPARTMENT_OTHER): Payer: Federal, State, Local not specified - PPO | Admitting: Anesthesiology

## 2019-02-10 ENCOUNTER — Other Ambulatory Visit: Payer: Self-pay

## 2019-02-10 ENCOUNTER — Encounter (HOSPITAL_BASED_OUTPATIENT_CLINIC_OR_DEPARTMENT_OTHER): Payer: Self-pay | Admitting: Emergency Medicine

## 2019-02-10 ENCOUNTER — Ambulatory Visit (HOSPITAL_BASED_OUTPATIENT_CLINIC_OR_DEPARTMENT_OTHER)
Admission: RE | Admit: 2019-02-10 | Discharge: 2019-02-10 | Disposition: A | Discharge status: Home / Self Care | Payer: Federal, State, Local not specified - PPO | Attending: General Surgery | Admitting: General Surgery

## 2019-02-10 ENCOUNTER — Encounter (HOSPITAL_BASED_OUTPATIENT_CLINIC_OR_DEPARTMENT_OTHER)
Admission: RE | Disposition: A | Discharge status: Home / Self Care | Payer: Self-pay | Source: Home / Self Care | Attending: General Surgery

## 2019-02-10 DIAGNOSIS — N6012 Diffuse cystic mastopathy of left breast: Secondary | ICD-10-CM | POA: Diagnosis not present

## 2019-02-10 DIAGNOSIS — N6489 Other specified disorders of breast: Secondary | ICD-10-CM | POA: Insufficient documentation

## 2019-02-10 DIAGNOSIS — N6320 Unspecified lump in the left breast, unspecified quadrant: Secondary | ICD-10-CM | POA: Diagnosis not present

## 2019-02-10 DIAGNOSIS — Z882 Allergy status to sulfonamides status: Secondary | ICD-10-CM | POA: Diagnosis not present

## 2019-02-10 DIAGNOSIS — Z880 Allergy status to penicillin: Secondary | ICD-10-CM | POA: Insufficient documentation

## 2019-02-10 DIAGNOSIS — D242 Benign neoplasm of left breast: Secondary | ICD-10-CM | POA: Diagnosis not present

## 2019-02-10 DIAGNOSIS — Z803 Family history of malignant neoplasm of breast: Secondary | ICD-10-CM | POA: Diagnosis not present

## 2019-02-10 DIAGNOSIS — N63 Unspecified lump in unspecified breast: Secondary | ICD-10-CM | POA: Diagnosis not present

## 2019-02-10 DIAGNOSIS — N62 Hypertrophy of breast: Secondary | ICD-10-CM | POA: Diagnosis not present

## 2019-02-10 HISTORY — PX: BREAST EXCISIONAL BIOPSY: SUR124

## 2019-02-10 HISTORY — DX: Pneumonia, unspecified organism: J18.9

## 2019-02-10 HISTORY — PX: MASS EXCISION: SHX2000

## 2019-02-10 SURGERY — EXCISION MASS
Anesthesia: General | Site: Breast | Laterality: Left

## 2019-02-10 MED ORDER — MIDAZOLAM HCL 2 MG/2ML IJ SOLN
1.0000 mg | INTRAMUSCULAR | Status: DC | PRN
  Administered 2019-02-10: 2 mg via INTRAVENOUS

## 2019-02-10 MED ORDER — FENTANYL CITRATE (PF) 100 MCG/2ML IJ SOLN
50.0000 ug | INTRAMUSCULAR | Status: AC | PRN
  Administered 2019-02-10 (×3): 50 ug via INTRAVENOUS

## 2019-02-10 MED ORDER — OXYCODONE HCL 5 MG PO TABS: 5.0000 mg | ORAL_TABLET | Freq: Once | ORAL | Status: DC | PRN

## 2019-02-10 MED ORDER — CIPROFLOXACIN IN D5W 400 MG/200ML IV SOLN
400.0000 mg | INTRAVENOUS | Status: AC
  Administered 2019-02-10 (×2): 400 mg via INTRAVENOUS

## 2019-02-10 MED ORDER — GABAPENTIN 300 MG PO CAPS
300.0000 mg | ORAL_CAPSULE | ORAL | Status: AC
  Administered 2019-02-10: 300 mg via ORAL

## 2019-02-10 MED ORDER — OXYCODONE HCL 5 MG PO TABS: 5.0000 mg | ORAL_TABLET | Freq: Four times a day (QID) | ORAL | 0 refills | Status: DC | PRN

## 2019-02-10 MED ORDER — GABAPENTIN 300 MG PO CAPS
ORAL_CAPSULE | ORAL | Status: AC
  Filled 2019-02-10: qty 1

## 2019-02-10 MED ORDER — ONDANSETRON HCL 4 MG/2ML IJ SOLN
INTRAMUSCULAR | Status: DC | PRN
  Administered 2019-02-10: 4 mg via INTRAVENOUS

## 2019-02-10 MED ORDER — LIDOCAINE-EPINEPHRINE (PF) 1 %-1:200000 IJ SOLN
INTRAMUSCULAR | Status: DC | PRN
  Administered 2019-02-10: 20 mL

## 2019-02-10 MED ORDER — FENTANYL CITRATE (PF) 100 MCG/2ML IJ SOLN
INTRAMUSCULAR | Status: AC
  Filled 2019-02-10: qty 2

## 2019-02-10 MED ORDER — OXYCODONE HCL 5 MG/5ML PO SOLN: 5.0000 mg | Freq: Once | ORAL | Status: DC | PRN

## 2019-02-10 MED ORDER — ACETAMINOPHEN 500 MG PO TABS
1000.0000 mg | ORAL_TABLET | ORAL | Status: AC
  Administered 2019-02-10: 1000 mg via ORAL

## 2019-02-10 MED ORDER — HYDROMORPHONE HCL 1 MG/ML IJ SOLN: 0.2500 mg | INTRAMUSCULAR | Status: DC | PRN

## 2019-02-10 MED ORDER — MIDAZOLAM HCL 2 MG/2ML IJ SOLN
INTRAMUSCULAR | Status: AC
  Filled 2019-02-10: qty 2

## 2019-02-10 MED ORDER — CHLORHEXIDINE GLUCONATE CLOTH 2 % EX PADS: 6.0000 | MEDICATED_PAD | Freq: Once | CUTANEOUS | Status: DC

## 2019-02-10 MED ORDER — EPHEDRINE SULFATE 50 MG/ML IJ SOLN
INTRAMUSCULAR | Status: DC | PRN
  Administered 2019-02-10: 10 mg via INTRAVENOUS

## 2019-02-10 MED ORDER — BUPIVACAINE-EPINEPHRINE (PF) 0.25% -1:200000 IJ SOLN
INTRAMUSCULAR | Status: DC | PRN
  Administered 2019-02-10: 20 mL

## 2019-02-10 MED ORDER — ACETAMINOPHEN 500 MG PO TABS
ORAL_TABLET | ORAL | Status: AC
  Filled 2019-02-10: qty 2

## 2019-02-10 MED ORDER — PROPOFOL 10 MG/ML IV BOLUS
INTRAVENOUS | Status: DC | PRN
  Administered 2019-02-10: 150 mg via INTRAVENOUS

## 2019-02-10 MED ORDER — PROMETHAZINE HCL 25 MG/ML IJ SOLN: 6.2500 mg | INTRAMUSCULAR | Status: DC | PRN

## 2019-02-10 MED ORDER — MEPERIDINE HCL 25 MG/ML IJ SOLN: 6.2500 mg | INTRAMUSCULAR | Status: DC | PRN

## 2019-02-10 MED ORDER — LACTATED RINGERS IV SOLN
INTRAVENOUS | Status: DC
  Administered 2019-02-10 (×2): via INTRAVENOUS

## 2019-02-10 MED ORDER — CIPROFLOXACIN IN D5W 400 MG/200ML IV SOLN
INTRAVENOUS | Status: AC
  Filled 2019-02-10: qty 200

## 2019-02-10 MED ORDER — DEXAMETHASONE SODIUM PHOSPHATE 4 MG/ML IJ SOLN
INTRAMUSCULAR | Status: DC | PRN
  Administered 2019-02-10: 10 mg via INTRAVENOUS

## 2019-02-10 MED ORDER — LIDOCAINE HCL (CARDIAC) PF 100 MG/5ML IV SOSY
PREFILLED_SYRINGE | INTRAVENOUS | Status: DC | PRN
  Administered 2019-02-10: 50 mg via INTRAVENOUS

## 2019-02-10 SURGICAL SUPPLY — 51 items
BLADE HEX COATED 2.75 (ELECTRODE) ×3 IMPLANT
BLADE SURG 10 STRL SS (BLADE) ×3 IMPLANT
BLADE SURG 15 STRL LF DISP TIS (BLADE) ×1 IMPLANT
BLADE SURG 15 STRL SS (BLADE) ×2
CANISTER SUCT 1200ML W/VALVE (MISCELLANEOUS) IMPLANT
CHLORAPREP W/TINT 26 (MISCELLANEOUS) ×3 IMPLANT
CLIP VESOCCLUDE MED 6/CT (CLIP) ×4 IMPLANT
CLOSURE WOUND 1/2 X4 (GAUZE/BANDAGES/DRESSINGS) ×1
COVER BACK TABLE REUSABLE LG (DRAPES) IMPLANT
COVER MAYO STAND REUSABLE (DRAPES) IMPLANT
COVER WAND RF STERILE (DRAPES) IMPLANT
DECANTER SPIKE VIAL GLASS SM (MISCELLANEOUS) IMPLANT
DERMABOND ADVANCED (GAUZE/BANDAGES/DRESSINGS) ×2
DERMABOND ADVANCED .7 DNX12 (GAUZE/BANDAGES/DRESSINGS) ×1 IMPLANT
DRAPE LAPAROSCOPIC ABDOMINAL (DRAPES) IMPLANT
DRAPE LAPAROTOMY 100X72 PEDS (DRAPES) IMPLANT
DRAPE UTILITY XL STRL (DRAPES) ×3 IMPLANT
DRSG PAD ABDOMINAL 8X10 ST (GAUZE/BANDAGES/DRESSINGS) IMPLANT
ELECT REM PT RETURN 9FT ADLT (ELECTROSURGICAL) ×3
ELECTRODE REM PT RTRN 9FT ADLT (ELECTROSURGICAL) ×1 IMPLANT
GAUZE SPONGE 4X4 12PLY STRL LF (GAUZE/BANDAGES/DRESSINGS) ×3 IMPLANT
GLOVE BIO SURGEON STRL SZ 6 (GLOVE) ×3 IMPLANT
GLOVE BIOGEL PI IND STRL 6.5 (GLOVE) ×1 IMPLANT
GLOVE BIOGEL PI INDICATOR 6.5 (GLOVE) ×4
GLOVE SURG SS PI 6.0 STRL IVOR (GLOVE) ×4 IMPLANT
GOWN STRL REUS W/ TWL LRG LVL3 (GOWN DISPOSABLE) ×1 IMPLANT
GOWN STRL REUS W/TWL 2XL LVL3 (GOWN DISPOSABLE) ×3 IMPLANT
GOWN STRL REUS W/TWL LRG LVL3 (GOWN DISPOSABLE) ×2
NDL HYPO 25X1 1.5 SAFETY (NEEDLE) ×1 IMPLANT
NEEDLE HYPO 25X1 1.5 SAFETY (NEEDLE) ×3 IMPLANT
NS IRRIG 1000ML POUR BTL (IV SOLUTION) IMPLANT
PACK BASIN DAY SURGERY FS (CUSTOM PROCEDURE TRAY) ×3 IMPLANT
PACK UNIVERSAL I (CUSTOM PROCEDURE TRAY) IMPLANT
PENCIL BUTTON HOLSTER BLD 10FT (ELECTRODE) ×3 IMPLANT
SLEEVE SCD COMPRESS KNEE MED (MISCELLANEOUS) ×2 IMPLANT
SPONGE LAP 18X18 RF (DISPOSABLE) ×5 IMPLANT
STAPLER VISISTAT 35W (STAPLE) IMPLANT
STRIP CLOSURE SKIN 1/2X4 (GAUZE/BANDAGES/DRESSINGS) ×2 IMPLANT
SUT MNCRL AB 4-0 PS2 18 (SUTURE) ×3 IMPLANT
SUT SILK 3 0 TIES 17X18 (SUTURE)
SUT SILK 3-0 18XBRD TIE BLK (SUTURE) IMPLANT
SUT VIC AB 2-0 SH 27 (SUTURE)
SUT VIC AB 2-0 SH 27XBRD (SUTURE) IMPLANT
SUT VIC AB 3-0 SH 27 (SUTURE) ×2
SUT VIC AB 3-0 SH 27X BRD (SUTURE) ×1 IMPLANT
SYR BULB 3OZ (MISCELLANEOUS) ×3 IMPLANT
SYR CONTROL 10ML LL (SYRINGE) ×3 IMPLANT
TOWEL GREEN STERILE FF (TOWEL DISPOSABLE) ×3 IMPLANT
TUBE CONNECTING 20'X1/4 (TUBING) ×1
TUBE CONNECTING 20X1/4 (TUBING) ×1 IMPLANT
YANKAUER SUCT BULB TIP NO VENT (SUCTIONS) ×2 IMPLANT

## 2019-02-10 NOTE — Anesthesia Preprocedure Evaluation (Signed)
Anesthesia Evaluation  Patient identified by MRN, date of birth, ID band Patient awake    Reviewed: Allergy & Precautions, NPO status , Patient's Chart, lab work & pertinent test results  Airway Mallampati: II  TM Distance: >3 FB Neck ROM: Full    Dental no notable dental hx.    Pulmonary neg pulmonary ROS,    Pulmonary exam normal breath sounds clear to auscultation       Cardiovascular negative cardio ROS Normal cardiovascular exam Rhythm:Regular Rate:Normal     Neuro/Psych negative neurological ROS  negative psych ROS   GI/Hepatic negative GI ROS, Neg liver ROS,   Endo/Other  negative endocrine ROS  Renal/GU negative Renal ROS  negative genitourinary   Musculoskeletal negative musculoskeletal ROS (+)   Abdominal   Peds negative pediatric ROS (+)  Hematology negative hematology ROS (+)   Anesthesia Other Findings Breast Mass  Reproductive/Obstetrics negative OB ROS                             Anesthesia Physical Anesthesia Plan  ASA: II  Anesthesia Plan: General   Post-op Pain Management:    Induction: Intravenous  PONV Risk Score and Plan: 3 and Ondansetron, Dexamethasone, Midazolam and Treatment may vary due to age or medical condition  Airway Management Planned: LMA  Additional Equipment:   Intra-op Plan:   Post-operative Plan: Extubation in OR  Informed Consent: I have reviewed the patients History and Physical, chart, labs and discussed the procedure including the risks, benefits and alternatives for the proposed anesthesia with the patient or authorized representative who has indicated his/her understanding and acceptance.     Dental advisory given  Plan Discussed with: CRNA  Anesthesia Plan Comments:         Anesthesia Quick Evaluation

## 2019-02-10 NOTE — Discharge Instructions (Addendum)
[facility] [employer] [facility] Office Phone Number [phone]  BREAST BIOPSY/ PARTIAL MASTECTOMY: POST OP INSTRUCTIONS  Always review your discharge instruction sheet given to you by the facility where your surgery was performed.  IF YOU HAVE DISABILITY OR FAMILY LEAVE FORMS, YOU MUST BRING THEM TO THE OFFICE FOR PROCESSING.  DO NOT GIVE THEM TO YOUR DOCTOR.  1. A prescription for pain medication may be given to you upon discharge.  Take your pain medication as prescribed, if needed.  If narcotic pain medicine is not needed, then you may take acetaminophen (Tylenol) or ibuprofen (Advil) as needed. 2. Take your usually prescribed medications unless otherwise directed 3. If you need a refill on your pain medication, please contact your pharmacy.  They will contact our office to request authorization.  Prescriptions will not be filled after 5pm or on week-ends. 4. You should eat very light the first 24 hours after surgery, such as soup, crackers, pudding, etc.  Resume your normal diet the day after surgery. 5. Most patients will experience some swelling and bruising in the breast.  Ice packs and a good support bra will help.  Swelling and bruising can take several days to resolve.  6. It is common to experience some constipation if taking pain medication after surgery.  Increasing fluid intake and taking a stool softener will usually help or prevent this problem from occurring.  A mild laxative (Milk of Magnesia or Miralax) should be taken according to package directions if there are no bowel movements after 48 hours. 7. Unless discharge instructions indicate otherwise, you may remove your bandages 48 hours after surgery, and you may shower at that time.  You may have steri-strips (small skin tapes) in place directly over the incision.  These strips should be left on the skin for 7-10 days.   Any sutures or staples will be removed at the office during your follow-up visit. 8. ACTIVITIES:  You may resume  regular daily activities (gradually increasing) beginning the next day.  Wearing a good support bra or sports bra (or the breast binder) minimizes pain and swelling.  You may have sexual intercourse when it is comfortable. a. You may drive when you no longer are taking prescription pain medication, you can comfortably wear a seatbelt, and you can safely maneuver your car and apply brakes. b. RETURN TO WORK:  __________1 week_______________ 9. You should see your doctor in the office for a follow-up appointment approximately two weeks after your surgery.  Your doctors nurse will typically make your follow-up appointment when she calls you with your pathology report.  Expect your pathology report 2-3 business days after your surgery.  You may call to check if you do not hear from [place] after three days.   WHEN TO CALL YOUR DOCTOR: 1. Fever over 101.0 2. Nausea and/or vomiting. 3. Extreme swelling or bruising. 4. Continued bleeding from incision. 5. Increased pain, redness, or drainage from the incision.  The clinic staff is available to answer your questions during regular business hours.  Please dont hesitate to call and ask to speak to one of the nurses for clinical concerns.  If you have a medical emergency, go to the nearest emergency room or call 911.  A surgeon from [facility] is always on call at the hospital.  For further questions, please visit [email or web address]     May take next dose of Tylenol at 1:45 PM if needed.    Post Anesthesia Home Care Instructions  Activity: Get plenty of rest  for the remainder of the day. A responsible individual must stay with you for 24 hours following the procedure.  For the next 24 hours, DO NOT: -Drive a car -Paediatric nurse -Drink alcoholic beverages -Take any medication unless instructed by your physician -Make any legal decisions or sign important papers.  Meals: Start with liquid foods such as gelatin or soup.  Progress to regular foods as tolerated. Avoid greasy, spicy, heavy foods. If nausea and/or vomiting occur, drink only clear liquids until the nausea and/or vomiting subsides. Call your physician if vomiting continues.  Special Instructions/Symptoms: Your throat may feel dry or sore from the anesthesia or the breathing tube placed in your throat during surgery. If this causes discomfort, gargle with warm salt water. The discomfort should disappear within 24 hours.  If you had a scopolamine patch placed behind your ear for the management of post- operative nausea and/or vomiting:  1. The medication in the patch is effective for 72 hours, after which it should be removed.  Wrap patch in a tissue and discard in the trash. Wash hands thoroughly with soap and water. 2. You may remove the patch earlier than 72 hours if you experience unpleasant side effects which may include dry mouth, dizziness or visual disturbances. 3. Avoid touching the patch. Wash your hands with soap and water after contact with the patch.

## 2019-02-10 NOTE — Interval H&P Note (Signed)
History and Physical Interval Note:  02/10/2019 8:32 AM  Sheena Jarvis  has presented today for surgery, with the diagnosis of LEFT BREAST MASS.  The various methods of treatment have been discussed with the patient and family. After consideration of risks, benefits and other options for treatment, the patient has consented to  Procedure(s): EXCISION OF LEFT BREAST MASS (Left) as a surgical intervention.  The patient's history has been reviewed, patient examined, no change in status, stable for surgery.  I have reviewed the patient's chart and labs.  Questions were answered to the patient's satisfaction.     Stark Klein

## 2019-02-10 NOTE — Op Note (Signed)
Excision of left breast mass  Indications: This patient presents with history of left breast mass, clinically axillary island of breast tissue  Pre-operative Diagnosis: left breast mass  Post-operative Diagnosis: left breast mass  Surgeon: Stark Klein   Anesthesia: General LMA anesthesia and Local anesthesia 1% plain lidocaine, 0.25.% bupivacaine, with epinephrine  ASA Class: 2  Procedure Details  The patient was seen in the Holding Room. The risks, benefits, complications, treatment options, and expected outcomes were discussed with the patient. The possibilities of reaction to medication, pulmonary aspiration, bleeding, infection, the need for additional procedures, failure to diagnose a condition, and creating a complication requiring transfusion or operation were discussed with the patient. The patient concurred with the proposed plan, giving informed consent.  The site of surgery properly noted/marked. The patient was taken to Operating Room # 8, identified, and the procedure verified as excision of left breast mass.   A Time Out was held and the above information confirmed.  After induction of anesthesia, the left breast, arm and chest were prepped and draped in standard fashion. An oblique incision was made in the axilla.  Weitlaner retractor was used to assist with visualization.   Dissection was carried down around the mass.  The area was very vascular and adherent to the latissimus and the pectoralis.  Clips were used for the vascular pedicles entering the mass.   Hemostasis was achieved with cautery.  Local   The wound was irrigated and closed with a 3-0 Vicryl interrupted deep dermal stitch and a 4-0 Monocryl subcuticular closure in layers.    Sterile dressings were applied. At the end of the operation, all sponge, instrument, and needle counts were correct.  Findings: grossly clear surgical margins  Estimated Blood Loss:  less than 50 mL            Specimens: left breast  tissue         Complications:  None; patient tolerated the procedure well.         Disposition: PACU - hemodynamically stable.         Condition: stable

## 2019-02-10 NOTE — Anesthesia Procedure Notes (Signed)
Procedure Name: LMA Insertion Date/Time: 02/10/2019 8:55 AM Performed by: Willa Frater, CRNA Pre-anesthesia Checklist: Patient identified, Emergency Drugs available, Suction available and Patient being monitored Patient Re-evaluated:Patient Re-evaluated prior to induction Oxygen Delivery Method: Circle system utilized Preoxygenation: Pre-oxygenation with 100% oxygen Induction Type: IV induction Ventilation: Mask ventilation without difficulty LMA: LMA inserted LMA Size: 3.0 Number of attempts: 1 Airway Equipment and Method: Bite block Placement Confirmation: positive ETCO2 Tube secured with: Tape Dental Injury: Teeth and Oropharynx as per pre-operative assessment

## 2019-02-10 NOTE — Transfer of Care (Signed)
Immediate Anesthesia Transfer of Care Note  Patient: Sheena Jarvis  Procedure(s) Performed: EXCISION OF LEFT BREAST MASS (Left Breast)  Patient Location: PACU  Anesthesia Type:General  Level of Consciousness: awake, alert  and oriented  Airway & Oxygen Therapy: Patient Spontanous Breathing and Patient connected to face mask oxygen  Post-op Assessment: Report given to RN and Post -op Vital signs reviewed and stable  Post vital signs: Reviewed and stable  Last Vitals:  Vitals Value Taken Time  BP 117/70 02/10/19 1015  Temp    Pulse 91 02/10/19 1015  Resp 8 02/10/19 1015  SpO2 100 % 02/10/19 1015  Vitals shown include unvalidated device data.  Last Pain:  Vitals:   02/10/19 0741  TempSrc: Oral  PainSc: 0-No pain         Complications: No apparent anesthesia complications

## 2019-02-11 ENCOUNTER — Encounter (HOSPITAL_BASED_OUTPATIENT_CLINIC_OR_DEPARTMENT_OTHER): Payer: Self-pay | Admitting: General Surgery

## 2019-02-11 LAB — SURGICAL PATHOLOGY

## 2019-02-11 NOTE — Anesthesia Postprocedure Evaluation (Signed)
Anesthesia Post Note  Patient: Sheena Jarvis  Procedure(s) Performed: EXCISION OF LEFT BREAST MASS (Left Breast)     Patient location during evaluation: PACU Anesthesia Type: General Level of consciousness: awake and alert Pain management: pain level controlled Vital Signs Assessment: post-procedure vital signs reviewed and stable Respiratory status: spontaneous breathing, nonlabored ventilation and respiratory function stable Cardiovascular status: blood pressure returned to baseline and stable Postop Assessment: no apparent nausea or vomiting Anesthetic complications: no    Last Vitals:  Vitals:   02/10/19 1045 02/10/19 1115  BP: 109/73 106/67  Pulse: 87 76  Resp: 15 16  Temp:  36.8 C  SpO2: 100% 100%    Last Pain:  Vitals:   02/10/19 1102  TempSrc:   PainSc: 0-No pain                 Lynda Rainwater

## 2019-02-21 DIAGNOSIS — Z681 Body mass index (BMI) 19 or less, adult: Secondary | ICD-10-CM | POA: Diagnosis not present

## 2019-02-21 DIAGNOSIS — N939 Abnormal uterine and vaginal bleeding, unspecified: Secondary | ICD-10-CM | POA: Diagnosis not present

## 2019-03-13 ENCOUNTER — Encounter: Payer: Self-pay | Admitting: General Surgery

## 2019-03-14 ENCOUNTER — Encounter: Payer: Self-pay | Admitting: Physical Therapy

## 2019-03-14 ENCOUNTER — Other Ambulatory Visit: Payer: Self-pay

## 2019-03-14 ENCOUNTER — Ambulatory Visit: Payer: Federal, State, Local not specified - PPO | Attending: General Surgery | Admitting: Physical Therapy

## 2019-03-14 DIAGNOSIS — M25512 Pain in left shoulder: Secondary | ICD-10-CM | POA: Diagnosis not present

## 2019-03-14 DIAGNOSIS — M6281 Muscle weakness (generalized): Secondary | ICD-10-CM | POA: Insufficient documentation

## 2019-03-14 DIAGNOSIS — Z483 Aftercare following surgery for neoplasm: Secondary | ICD-10-CM | POA: Diagnosis not present

## 2019-03-14 DIAGNOSIS — M25612 Stiffness of left shoulder, not elsewhere classified: Secondary | ICD-10-CM | POA: Diagnosis not present

## 2019-03-14 NOTE — Patient Instructions (Signed)
First of all, check with your insurance company to see if provider is in [place] (for wigs and compression sleeves / gloves/gauntlets )  [place], [place] [address] [phone]  Will file some insurances --- call for appointment   Second to [facility] (for mastectomy prosthetics and garments) [place], [place] [address] [phone] Will file some insurances --- call for appointment  [facility]  [address]  [place], [place] [address] [phone] Lower extremity garments  [facility] [place], [place]  [address] [place] ( Medicaid certified lymphedema fitter) [phone] [email or web address]  [place] [place], [place] [address] [phone]  Other Resources: National Lymphedema Network:  www.lymphnet.org www.Klosetraining.com for patient articles and self manual lymph drainage information www.lymphedemablog.com has informative articles.  DishTag.es.com www.lymphedemaproducts.com www.brightlifedirect.com [email or web address]     Prariewear prima and hugger  Size n Underarm band size 34

## 2019-03-14 NOTE — Therapy (Signed)
[facility] [address] [place], [place], [address] Phone: [phone]   Fax:  [phone]  Physical Therapy Evaluation  Patient Details  Name: Sheena Jarvis MRN: [id number] Date of Birth: 1970-10-12 Referring Provider (PT): Dr.Byerly    Encounter Date: 03/14/2019  PT End of Session - 03/14/19 1242    Visit Number  1    Number of Visits  9    PT Start Time  0800    PT Stop Time  0850    PT Time Calculation (min)  50 min    Activity Tolerance  Patient tolerated treatment well    Behavior During Therapy  [facility] for tasks assessed/performed       Past Medical History:  Diagnosis Date  . Pneumonia   . Vision abnormalities     Past Surgical History:  Procedure Laterality Date  . APPENDECTOMY    . BREAST CYST ASPIRATION Left   . HIP ARTHROSCOPY    . MASS EXCISION Left 02/10/2019   Procedure: EXCISION OF LEFT BREAST MASS;  Surgeon: Almond Lint, MD;  Location: [facility];  Service: General;  Laterality: Left;    There were no vitals filed for this visit.   Subjective Assessment - 03/14/19 0811    Subjective  Pt is having in her left breast and decreasead function of her arm, Jarvis in her seroma    Pertinent History  Pt has removed of benign breast mass on 02/10/2019 with complication of seroma and clogged drain. She is fearful it may be getting infected and has just started an antibiotic, but doesn't know if she will be able to tolerate it. She has been wearing her post op binder with added compression. Her drain site has just stopped draining.  She can feel sharp Jarvis from her drain site to her nipple when the seroma starts to fill up She is concerned pt tends to scar down    Patient Stated Goals  To get her arm back to normal    Currently in Jarvis?  Yes    Jarvis Score  3    when the seroma is filling   Jarvis Location  Breast   Jarvis in shoulder        [facility] PT Assessment - 03/14/19 0001      Assessment    Medical Diagnosis  left breast mass excision    PASH: pseudoangiomatous stromal hyperplasia    Referring Provider (PT)  Dr.Byerly     Onset Date/Surgical Date  02/10/19    Hand Dominance  Left      Precautions   Precautions  None      Restrictions   Weight Bearing Restrictions  No      Balance Screen   Has the patient fallen in the past 6 months  No    Has the patient had a decrease in activity level because of a fear of falling?   No    Is the patient reluctant to leave their home because of a fear of falling?   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 She has healing incisions in left axilla with contracted are above it and healing drain site that she says has had some drainage       Sensation   Light Touch  Not tested    Additional Comments  Pt reports she has a lot of numbess in left upper quadrant area       Coordination   Gross Motor Movements are Fluid and Coordinated  No   limited by Jarvis and restriction in left upper quadrant      Posture/Postural Control   Posture/Postural Control  No significant limitations      ROM / Strength   AROM / PROM / Strength  AROM;Strength      AROM   Overall AROM Comments  right arm is WNL     Right Shoulder Flexion  130 Degrees    Right Shoulder ABduction  90 Degrees   feel Jarvis down arm with aboduction    Right Shoulder External Rotation   95 Degrees      Strength   Overall Strength  Deficits    Overall Strength Comments  limited by Jarvis and pulling in left shoulder to 3-/5 , mild winging of left scapula with shoulder elevation       Palpation   Palpation comment  firmness in left axilla, pec area and breast  pt has guitar string cord in left axilla and can feel pulling down to thumb         LYMPHEDEMA/ONCOLOGY QUESTIONNAIRE - 03/14/19 0835      Right Upper Extremity Lymphedema   10 cm Proximal to Olecranon Process  24 cm    Olecranon Process  22 cm    15 cm Proximal to Ulnar Styloid Process  22 cm    Across Hand at [facility]  19 cm    At [place] of 2nd Digit  5.8 cm      Left Upper Extremity Lymphedema   10 cm Proximal to Olecranon Process  23 cm    Olecranon Process  22 cm    15 cm Proximal to Ulnar Styloid Process  21 cm    Just Proximal to Ulnar Styloid Process  15 cm    Across Hand at [facility]  18.5 cm    At [place] of 2nd Digit  5.8 cm          Quick Dash - 03/14/19 0001    Open a tight or new jar  Unable    Do heavy household chores (wash walls, wash floors)  Unable    Carry a shopping bag or briefcase  Unable    Wash your back  Unable    Use a knife to cut food  Unable    Recreational activities in which you take some force or impact through your arm, shoulder, or hand (golf, hammering, tennis)  Unable    During the past week, to what extent has your arm, shoulder or hand problem interfered with your normal social activities with family, friends, neighbors, or groups?  Quite a bit    During the past week, to what extent has your arm, shoulder or hand problem limited  your work or other regular daily activities  Extremely    Arm, shoulder, or hand Jarvis.  Severe    Tingling (pins and needles) in your arm, shoulder, or hand  Mild    Difficulty Sleeping  Severe difficulty    DASH Score  86.36 %        Objective measurements completed on examination: See above findings.      [facility]  PT Treatment/Exercise - 03/14/19 0001      Manual Therapy   Manual Therapy  Edema management    Edema Management  provided pt information about how to get a prarie hugger prima and vida, meausred chest at under arms at 34 inches.  sent script to dr, Barry Dienes, gave pt soft tg soft to left arm with deep fold at top and made a chip pack for pt to wear at axilla              PT Education - 03/14/19 1242    Education Details  how to get a compression bra    Methods  Handout    Comprehension  Verbalized understanding          PT Long Term Goals - 03/14/19 1253      PT LONG TERM GOAL #1   Title  Pt will be independent in sel MLD and use of compression to manage fullness in left upper quadrant    Time  4    Period  Weeks    Status  New      PT LONG TERM GOAL #2   Title  Pt will have 150 degrees of left shoulder flexion and abduction so that she can return to normal activities    Time  4    Period  Weeks    Status  New             Plan - 03/14/19 1243    Clinical Impression Statement  Pt is about 4 week post excision of large benign breast lesion that has been complicated by seroma.  She presents with decrease shoulder ROM and and Jarvis with firmness and swelling in axilla,chest and breast.    Personal Factors and Comorbidities  Comorbidity 2    Comorbidities  breast surgery with seroma    Examination-Activity Limitations  Reach Overhead    Stability/Clinical Decision Making  Stable/Uncomplicated    Clinical Decision Making  Low    Rehab Potential  Good    PT Frequency  3x / week   3 times a week for 2 weeks then 2 times a week for 2 weeks   PT Duration  4 weeks    PT Treatment/Interventions  ADLs/Self Care Home Management;Therapeutic exercise;Orthotic Fit/Training;Patient/family education;Manual techniques;Manual lymph drainage;Passive range of motion;Scar mobilization;Taping;Functional mobility training;DME Instruction;Therapeutic activities    PT Next Visit Plan   perform and instruct in MLD ( hold off on shoudler ROM above 90 degrees until seroma more stable)  check tg soft and compression. See if pt is going to second to nature for prarie bra or give her script,manual work to cording  Gradually increase ROM    Consulted and Agree with Plan of Care  Patient       Patient will benefit from skilled therapeutic intervention in order to improve the following deficits and impairments:  Jarvis, Impaired UE functional use, Impaired flexibility, Increased fascial restricitons, Decreased strength, Decreased knowledge of use of DME, Decreased range of motion, Decreased scar mobility, Increased edema, Impaired perceived functional ability, Decreased skin integrity  Visit Diagnosis: Aftercare following surgery for neoplasm - Plan: PT plan of care cert/re-cert  Stiffness of left shoulder, not elsewhere classified - Plan: PT plan of care cert/re-cert  Muscle weakness (generalized) - Plan: PT plan of care cert/re-cert  Acute Jarvis of left shoulder - Plan: PT plan of care cert/re-cert     Problem List Patient Active Problem List   Diagnosis Date Noted  . Numbness 02/04/2016  . Cognitive changes 02/04/2016  . Other fatigue 02/04/2016   Bayard Huggereresa K. Manson PasseyBrown, PT  Donnetta HailBrown, Ursala Cressy Krall 03/14/2019, 12:57 PM  [facility] [address] [place], [place], [address] Phone: [phone]   Fax:  [phone]  Name: Sheena Jarvis MRN: [id number] Date of Birth: 01/20/1971

## 2019-03-16 ENCOUNTER — Encounter: Payer: Self-pay | Admitting: Physical Therapy

## 2019-03-16 ENCOUNTER — Other Ambulatory Visit: Payer: Self-pay

## 2019-03-16 ENCOUNTER — Ambulatory Visit: Payer: Federal, State, Local not specified - PPO | Admitting: Physical Therapy

## 2019-03-16 DIAGNOSIS — M6281 Muscle weakness (generalized): Secondary | ICD-10-CM | POA: Diagnosis not present

## 2019-03-16 DIAGNOSIS — Z483 Aftercare following surgery for neoplasm: Secondary | ICD-10-CM | POA: Diagnosis not present

## 2019-03-16 DIAGNOSIS — M25512 Pain in left shoulder: Secondary | ICD-10-CM | POA: Diagnosis not present

## 2019-03-16 DIAGNOSIS — M25612 Stiffness of left shoulder, not elsewhere classified: Secondary | ICD-10-CM | POA: Diagnosis not present

## 2019-03-16 NOTE — Therapy (Signed)
[facility] [address] [place], [place], [address] Phone: [phone]   Fax:  [phone]  Physical Therapy Treatment  Patient Details  Name: Sheena Jarvis MRN: [id number] Date of Birth: 18-Oct-1970 Referring Provider (PT): Dr.Byerly    Encounter Date: 03/16/2019  PT End of Session - 03/16/19 1000    Visit Number  2    Number of Visits  9    Date for PT Re-Evaluation  04/13/19    PT Start Time  0800    PT Stop Time  0855    PT Time Calculation (min)  55 min    Activity Tolerance  Patient tolerated treatment well    Behavior During Therapy  [facility] for tasks assessed/performed       Past Medical History:  Diagnosis Date  . Pneumonia   . Vision abnormalities     Past Surgical History:  Procedure Laterality Date  . APPENDECTOMY    . BREAST CYST ASPIRATION Left   . HIP ARTHROSCOPY    . MASS EXCISION Left 02/10/2019   Procedure: EXCISION OF LEFT BREAST MASS;  Surgeon: Almond Lint, MD;  Location: [facility];  Service: General;  Laterality: Left;    There were no vitals filed for this visit.  Subjective Assessment - 03/16/19 0808    Subjective  Pt stated she changed her antibiotic. She will be getting her Prarie Bra likely from [facility] [employer].  She has been wearing her compression and feels that it is helping.  She will be using her arm alot today at work    Pertinent History  Pt has removed of benign breast mass on 02/10/2019 with complication of seroma and clogged drain. She is fearful it may be getting infected and has just started an antibiotic, but doesn't know if she will be able to tolerate it. She has been wearing her post op binder with added compression. Her drain site has just stopped draining.  She can feel sharp pain from her drain site to her nipple when the seroma starts to fill up She is concerned pt tends to scar down    Patient Stated Goals  To get her arm back to normal    Currently in  Pain?  Yes    Pain Score  2     Pain Location  Axilla    Pain Orientation  Left    Pain Descriptors / Indicators  Shooting    Pain Type  Acute pain    Pain Radiating Towards  breast    Pain Frequency  Intermittent    Aggravating Factors   reaching and using her arm,    Pain Relieving Factors  compression helps    Effect of Pain on Daily Activities  limits work activiites                       [facility] Adult PT Treatment/Exercise - 03/16/19 0001      Manual Therapy   Manual Therapy  Edema management;Myofascial release;Manual Lymphatic Drainage (MLD);Passive ROM;Taping    Edema Management  gave pt link from [employer] website for self care videos of self MLD and also lymphedema risk reduction education.     Myofascial Release  gently to scarred area at lateral chest     Manual Lymphatic Drainage (MLD)  instructed and performed in supine, short neck, superficial and deep abdominals, left inguinal nodes. left axillo-inguinal anastamosis, right axillar nodes, anterior interaxillary anastamosis, left breast and chest, left axilla,  then to sidelying for posterior  interaxillary anastamosis and more work on axilla and lateral chest     Passive ROM  to left arm in sidelying with manual assist for upward rotation of scapula     Kinesiotex  Edema      Kinesiotix   Edema  skin cote ofer left lateral upper breast at track of seroma, then small dotted peach foam to area taped down with 2 pieces of kinesiotape. gave pt copies of how to [place]ascade for breast lymphedema from our book and she may try that at home as she is skilled in application of kinesiotape                   PT Long Term Goals - 03/14/19 1253      PT [place] #1   Title  Pt will be independent in sel MLD and use of compression to manage fullness in left upper quadrant    Time  4    Period  Weeks    Status  New      PT LONG TERM GOAL #2   Title  Pt will have 150 degrees of left shoulder flexion and  abduction so that she can return to normal activities    Time  4    Period  Weeks    Status  New            Plan - 03/16/19 1001    Clinical Impression Statement  Pt is having some intial results with compression.  She was educated in MLD today and can follow through with that at home. Tried applying direct compression with small dotted foam to seroma track with Ktape today to see if that will help.  Gave pt options for ktape for breast.  Hopeful she will get prarie compression bra soon. Script faxed to [facility] [employer] and gave pt a hard copy    PT Treatment/Interventions  ADLs/Self Care Home Management;Therapeutic exercise;Orthotic Fit/Training;Patient/family education;Manual techniques;Manual lymph drainage;Passive range of motion;Scar mobilization;Taping;Functional mobility training;DME Instruction;Therapeutic activities    PT Next Visit Plan  perform and review in MLD, manual work with release and soft tissue work to left shoulder as tolerated. ? how did foam and ktape do?  check tg soft and compression. See if pt is going to [facility] for prarie bra ,manual work to cording  Gradually increase ROM    Recommended Other Services  compression bra script faxed to [facility] [employer] and gave pt a hard copy    Consulted and Agree with Plan of Care  Patient       Patient will benefit from skilled therapeutic intervention in order to improve the following deficits and impairments:  Pain, Impaired UE functional use, Impaired flexibility, Increased fascial restricitons, Decreased strength, Decreased knowledge of use of DME, Decreased range of motion, Decreased scar mobility, Increased edema, Impaired perceived functional ability, Decreased skin integrity  Visit Diagnosis: Aftercare following surgery for neoplasm  Stiffness of left shoulder, not elsewhere classified  Muscle weakness (generalized)  Acute pain of left shoulder     Problem List Patient Active Problem List   Diagnosis  Date Noted  . Numbness 02/04/2016  . Cognitive changes 02/04/2016  . Other fatigue 02/04/2016   Donato Heinz. Owens Shark PT  Norwood Levo 03/16/2019, 10:06 AM  [place], [place], [address] Phone: [phone]   Fax:  [phone]  Name: Sheena Jarvis MRN: [id number] Date of Birth:  02/19/1971   

## 2019-03-16 NOTE — Patient Instructions (Signed)
[email or web address] Courses Online Strength After Breast Cancer Look at the right of the page for Lymphedema Education Session  

## 2019-03-17 ENCOUNTER — Ambulatory Visit: Payer: Federal, State, Local not specified - PPO

## 2019-03-17 DIAGNOSIS — M25612 Stiffness of left shoulder, not elsewhere classified: Secondary | ICD-10-CM | POA: Diagnosis not present

## 2019-03-17 DIAGNOSIS — M6281 Muscle weakness (generalized): Secondary | ICD-10-CM | POA: Diagnosis not present

## 2019-03-17 DIAGNOSIS — M25512 Pain in left shoulder: Secondary | ICD-10-CM | POA: Diagnosis not present

## 2019-03-17 DIAGNOSIS — Z483 Aftercare following surgery for neoplasm: Secondary | ICD-10-CM | POA: Diagnosis not present

## 2019-03-17 NOTE — Therapy (Signed)
[facility] [place], [place], [address] Phone: [phone]   Fax:  [phone]  Physical Therapy Treatment  Patient Details  Name: Sheena Jarvis MRN: [id number] Date of Birth: 04/06/1971 Referring Provider (PT): Dr.Byerly    Encounter Date: 03/17/2019  PT End of Session - 03/17/19 1446    Visit Number  3    Number of Visits  9    Date for PT Re-Evaluation  04/13/19    PT Start Time  6962    PT Stop Time  1400    PT Time Calculation (min)  55 min    Activity Tolerance  Patient tolerated treatment well    Behavior During Therapy  [facility] for tasks assessed/performed       Past Medical History:  Diagnosis Date  . Pneumonia   . Vision abnormalities     Past Surgical History:  Procedure Laterality Date  . APPENDECTOMY    . BREAST CYST ASPIRATION Left   . HIP ARTHROSCOPY    . MASS EXCISION Left 02/10/2019   Procedure: EXCISION OF LEFT BREAST MASS;  Surgeon: Stark Klein, MD;  Location: [place];  Service: General;  Laterality: Left;    There were no vitals filed for this visit.  Subjective Assessment - 03/17/19 1445    Subjective  Pt states she was able to put some weight through her arm in quadruped today without pain. She has been wearing her chip pack with good results and less pain/edema in the L axilla.    Pertinent History  Pt has removed of benign breast mass on 95/06/8411 with complication of seroma and clogged drain. She is fearful it may be getting infected and has just started an antibiotic, but doesn't know if she will be able to tolerate it. She has been wearing her post op binder with added compression. Her drain site has just stopped draining.  She can feel sharp pain from her drain site to her nipple when the seroma starts to fill up She is concerned pt tends to scar down    Patient Stated Goals  To get her arm back to normal    Currently in Pain?  Yes    Pain Score  2     Pain  Location  Axilla    Pain Orientation  Left                       [facility] Adult PT Treatment/Exercise - 03/17/19 0001      Manual Therapy   Manual Therapy  Manual Lymphatic Drainage (MLD);Soft tissue mobilization    Manual therapy comments  pt was provided with new TG Soft     Soft tissue mobilization  IASTM over the lateral chest wall and axilla over scar tissue and cording. STM/TpR to the teres minor, infraspinatus due to reports of impingment in the L shoulder with shoulder ROM to 90 degrees flexion/abduction.     Manual Lymphatic Drainage (MLD)  Supine: short neck, Bil axillary nodes, Bil inguinal nodes, anterior inter-axillary anastomosis, L axillo-inguinal anastomosis, L superier breast, anterior inter-axillary anastomosis, inferior breast, L axillo-inguinal anastomosis, medial to lateral brachium, lateral brachum, all surfaces of the antebrachium, dorsum of the hand, re-worked all surfaces followed by superficial abdominals with education throughout on skin stretch and direction.     Passive ROM  To L arm into flexion/abduction to end range with increased pain-free ROM following STM.  PT Education - 03/17/19 1440    Education Details  Pt educated throughout MLD on skin stretch for lymphatic flow    Person(s) Educated  Patient    Methods  Explanation    Comprehension  Verbalized understanding          PT Long Term Goals - 03/14/19 1253      PT LONG TERM GOAL #1   Title  Pt will be independent in sel MLD and use of compression to manage fullness in left upper quadrant    Time  4    Period  Weeks    Status  New      PT LONG TERM GOAL #2   Title  Pt will have 150 degrees of left shoulder flexion and abduction so that she can return to normal activities    Time  4    Period  Weeks    Status  New            Plan - 03/17/19 1311    Clinical Impression Statement  Pt continues to improve with compression and states that she feels a little  better than she did last session. Tightness noted in the L lateral wall and axilla with cording noted; improved following IASTM. Tightness noted in the L infraspinatus, teres minor with triggerpoints; decreased following light tripper point release. Pt was able to gain increased pain-free P/ROM following STM. MLD was performed following STM/IASTM in order to decrease risk for lymphedema and to facilitate lymphatic flow. Pt will benefit from continued POC.    Personal Factors and Comorbidities  Comorbidity 2    Comorbidities  breast surgery with seroma    Examination-Activity Limitations  Reach Overhead    PT Frequency  3x / week    PT Duration  4 weeks    PT Treatment/Interventions  ADLs/Self Care Home Management;Therapeutic exercise;Orthotic Fit/Training;Patient/family education;Manual techniques;Manual lymph drainage;Passive range of motion;Scar mobilization;Taping;Functional mobility training;DME Instruction;Therapeutic activities    PT Next Visit Plan  perform and review in MLD, manual work with release and soft tissue work to left shoulder as tolerated lats and pec major/minor, manual work to cording  Gradually increase ROM       Patient will benefit from skilled therapeutic intervention in order to improve the following deficits and impairments:  Pain, Impaired UE functional use, Impaired flexibility, Increased fascial restricitons, Decreased strength, Decreased knowledge of use of DME, Decreased range of motion, Decreased scar mobility, Increased edema, Impaired perceived functional ability, Decreased skin integrity  Visit Diagnosis: Aftercare following surgery for neoplasm  Stiffness of left shoulder, not elsewhere classified  Muscle weakness (generalized)  Acute pain of left shoulder     Problem List Patient Active Problem List   Diagnosis Date Noted  . Numbness 02/04/2016  . Cognitive changes 02/04/2016  . Other fatigue 02/04/2016    Claudia Desanctis , PT 03/17/2019, 2:47  PM  [facility] [address] [place], [place], [address] Phone: [phone]   Fax:  [phone]  Name: KATHLEN SAKURAI MRN: [id number] Date of Birth: 12-28-1970

## 2019-03-18 ENCOUNTER — Encounter

## 2019-03-18 DIAGNOSIS — Z9012 Acquired absence of left breast and nipple: Secondary | ICD-10-CM | POA: Diagnosis not present

## 2019-03-21 ENCOUNTER — Ambulatory Visit: Payer: Federal, State, Local not specified - PPO | Admitting: Rehabilitation

## 2019-03-21 ENCOUNTER — Other Ambulatory Visit: Payer: Self-pay

## 2019-03-21 ENCOUNTER — Encounter: Payer: Self-pay | Admitting: Rehabilitation

## 2019-03-21 DIAGNOSIS — M25612 Stiffness of left shoulder, not elsewhere classified: Secondary | ICD-10-CM | POA: Diagnosis not present

## 2019-03-21 DIAGNOSIS — M6281 Muscle weakness (generalized): Secondary | ICD-10-CM

## 2019-03-21 DIAGNOSIS — Z483 Aftercare following surgery for neoplasm: Secondary | ICD-10-CM | POA: Diagnosis not present

## 2019-03-21 DIAGNOSIS — M25512 Pain in left shoulder: Secondary | ICD-10-CM | POA: Diagnosis not present

## 2019-03-21 NOTE — Therapy (Signed)
[place], [place], [address] Phone: [phone]   Fax:  [phone]  Physical Therapy Treatment  Patient Details  Name: Sheena Jarvis MRN: [id number] Date of Birth: 1970-07-06 Referring Provider (PT): Dr.Byerly    Encounter Date: 03/21/2019  PT End of Session - 03/21/19 1604    Visit Number  4    Number of Visits  9    Date for PT Re-Evaluation  04/13/19    PT Start Time  6160    PT Stop Time  1355    PT Time Calculation (min)  53 min    Activity Tolerance  Patient tolerated treatment well    Behavior During Therapy  [facility] for tasks assessed/performed       Past Medical History:  Diagnosis Date  . Pneumonia   . Vision abnormalities     Past Surgical History:  Procedure Laterality Date  . APPENDECTOMY    . BREAST CYST ASPIRATION Left   . HIP ARTHROSCOPY    . MASS EXCISION Left 02/10/2019   Procedure: EXCISION OF LEFT BREAST MASS;  Surgeon: Stark Klein, MD;  Location: [place];  Service: General;  Laterality: Left;    There were no vitals filed for this visit.  Subjective Assessment - 03/21/19 1501    Subjective  I just feel stuck in the abduction    Pertinent History  Pt has removed of benign breast mass on 73/11/1060 with complication of seroma and clogged drain. She is fearful it may be getting infected and has just started an antibiotic, but doesn't know if she will be able to tolerate it. She has been wearing her post op binder with added compression. Her drain site has just stopped draining.  She can feel sharp pain from her drain site to her nipple when the seroma starts to fill up She is concerned pt tends to scar down    Patient Stated Goals  To get her arm back to normal    Currently in Pain?  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      Soft tissue mobilization  to the latissimus, teres minor, UT, and pectoralis    Myofascial Release  to the left axillary scar tissue and with lateral border of the breast blocked with PROM into flexion, ER, and D2 positions stopping with any burning pain    Manual Lymphatic Drainage (MLD)  stationary circles towards the left axilla in sideling from the lateral chest                  PT Long Term Goals - 03/14/19 1253      PT LONG TERM GOAL #1   Title  Pt will be independent in sel MLD and use of compression to manage fullness in left upper quadrant    Time  4    Period  Weeks    Status  New      PT LONG TERM GOAL #2   Title  Pt will have 150 degrees of left shoulder flexion and abduction so that she can return to normal activities    Time  4    Period  Weeks    Status  New            Plan - 03/21/19 1604    Clinical Impression Statement  Pts compression bra seeming a bit loose today so she will be seeing about getting a size small.  Stil with tightness in the left lateral breast into the scar tissue area in the axilla.  Abduction AROM improving from 103 to 132 post treatment with axillary release and cording focus.       Patient will benefit from skilled therapeutic intervention in order to improve the following deficits and impairments:     Visit Diagnosis: Aftercare following surgery for neoplasm  Stiffness of left shoulder, not elsewhere classified  Muscle weakness (generalized)  Acute pain of left shoulder     Problem List Patient Active Problem List   Diagnosis Date Noted  . Numbness 02/04/2016  . Cognitive changes 02/04/2016  . Other fatigue 02/04/2016    [name] 03/21/2019, 4:06 PM  [facility] [address] [place], [place], [address] Phone: [phone]   Fax:  [phone]  Name: Sheena Jarvis MRN: [id number] Date of Birth: [birth year]

## 2019-03-23 ENCOUNTER — Ambulatory Visit: Payer: Federal, State, Local not specified - PPO | Admitting: Physical Therapy

## 2019-03-23 ENCOUNTER — Encounter: Payer: Self-pay | Admitting: Physical Therapy

## 2019-03-23 ENCOUNTER — Other Ambulatory Visit: Payer: Self-pay

## 2019-03-23 DIAGNOSIS — Z483 Aftercare following surgery for neoplasm: Secondary | ICD-10-CM | POA: Diagnosis not present

## 2019-03-23 DIAGNOSIS — M6281 Muscle weakness (generalized): Secondary | ICD-10-CM | POA: Diagnosis not present

## 2019-03-23 DIAGNOSIS — M25512 Pain in left shoulder: Secondary | ICD-10-CM | POA: Diagnosis not present

## 2019-03-23 DIAGNOSIS — M25612 Stiffness of left shoulder, not elsewhere classified: Secondary | ICD-10-CM

## 2019-03-23 NOTE — Therapy (Signed)
[place], [place], [address] Phone: [phone]   Fax:  [phone]  Physical Therapy Treatment  Patient Details  Name: Sheena Jarvis MRN: [id number] Date of Birth: [birth year] Referring Provider (PT): Dr.Byerly    Encounter Date: 03/23/2019  PT End of Session - 03/23/19 1246    Visit Number  5    Number of Visits  9    Date for PT Re-Evaluation  04/13/19    PT Start Time  0800    PT Stop Time  0845    PT Time Calculation (min)  45 min    Activity Tolerance  Patient tolerated treatment well    Behavior During Therapy  [facility] for tasks assessed/performed       Past Medical History:  Diagnosis Date  . Pneumonia   . Vision abnormalities     Past Surgical History:  Procedure Laterality Date  . APPENDECTOMY    . BREAST CYST ASPIRATION Left   . HIP ARTHROSCOPY    . MASS EXCISION Left 02/10/2019   Procedure: EXCISION OF LEFT BREAST MASS;  Surgeon: Stark Klein, MD;  Location: [place];  Service: General;  Laterality: Left;    There were no vitals filed for this visit.  Subjective Assessment - 03/23/19 1236    Subjective  Pt states she did chest opening exercise over a folded yoga blanket last night for about 3-4 minutes and had sever pain and increased coding down into her arm that took several minutes to survive    Pertinent History  Pt has removed of benign breast mass on 29/09/1882 with complication of seroma and clogged drain. She is fearful it may be getting infected and has just started an antibiotic, but doesn't know if she will be able to tolerate it. She has been wearing her post op binder with added compression. Her drain site has just stopped draining.  She can feel sharp pain from her drain site to her nipple when the seroma starts to fill up She is concerned pt tends to scar down    Patient Stated Goals  To get her arm back to normal    Currently in Pain?  No/denies   not  at current time                      [facility] Adult PT Treatment/Exercise - 03/23/19 0001      Manual Therapy   Manual therapy comments  small tg soft doubled over at upper arm. talked with pt about wearing an athletic compression shirt vs. class 1 sleeve to arm to support lymphatic system and help cording.     Edema Management  added lined compression to put inside bra and jovipak to get more compression to firm area at pec     Myofascial Release  to left axilla and incisions    Manual Lymphatic Drainage (MLD)  in supine, short neck, superficial and deep abdominals, left inguinal nodes. left axillo-inguinal anastamosis, right axillar nodes, anterior interaxillary anastamosis, left breast and chest, left axilla, then to sidelying for posterior  interaxillary anastamosis and more work on axilla and lateral chest     Kinesiotex  Edema      Kinesiotix   Edema  skin cote and I band of ktape with cuts inti strethed over contracted incisions at left axilla.                   PT Long Term Goals -  03/14/19 1253      PT LONG TERM GOAL #1   Title  Pt will be independent in sel MLD and use of compression to manage fullness in left upper quadrant    Time  4    Period  Weeks    Status  New      PT LONG TERM GOAL #2   Title  Pt will have 150 degrees of left shoulder flexion and abduction so that she can return to normal activities    Time  4    Period  Weeks    Status  New            Plan - 03/23/19 1246    Clinical Impression Statement  Pt had exacerbation of congestion in left arm and chest after stretching last night Encouraged her to wear ther tg soft sleeve to support her lymph system and added kinesiotape and more compression to pec area.    Comorbidities  breast surgery with seroma    Examination-Activity Limitations  Reach Overhead    PT Next Visit Plan  perform and review in MLD, manual work with release and soft tissue work to left shoulder as tolerated  lats and pec major/minor, manual work to cording  Gradually increase ROM       Patient will benefit from skilled therapeutic intervention in order to improve the following deficits and impairments:  Pain, Impaired UE functional use, Impaired flexibility, Increased fascial restricitons, Decreased strength, Decreased knowledge of use of DME, Decreased range of motion, Decreased scar mobility, Increased edema, Impaired perceived functional ability, Decreased skin integrity  Visit Diagnosis: Aftercare following surgery for neoplasm  Stiffness of left shoulder, not elsewhere classified  Muscle weakness (generalized)  Acute pain of left shoulder     Problem List Patient Active Problem List   Diagnosis Date Noted  . Numbness 02/04/2016  . Cognitive changes 02/04/2016  . Other fatigue 02/04/2016   Bayard Hugger. Manson Passey PT  Donnetta Hail 03/23/2019, 12:51 PM  [facility] [address] [place], [place], [address] Phone: [phone]   Fax:  [phone]  Name: Sheena Jarvis MRN: [id number] Date of Birth: 08/05/1970

## 2019-03-25 ENCOUNTER — Encounter: Payer: Self-pay | Admitting: Rehabilitation

## 2019-03-25 ENCOUNTER — Other Ambulatory Visit: Payer: Self-pay

## 2019-03-25 ENCOUNTER — Ambulatory Visit: Payer: Federal, State, Local not specified - PPO | Admitting: Rehabilitation

## 2019-03-25 DIAGNOSIS — Z483 Aftercare following surgery for neoplasm: Secondary | ICD-10-CM

## 2019-03-25 DIAGNOSIS — M6281 Muscle weakness (generalized): Secondary | ICD-10-CM

## 2019-03-25 DIAGNOSIS — M25612 Stiffness of left shoulder, not elsewhere classified: Secondary | ICD-10-CM

## 2019-03-25 DIAGNOSIS — M25512 Pain in left shoulder: Secondary | ICD-10-CM

## 2019-03-25 NOTE — Therapy (Signed)
[place], [place], [address] Phone: [phone]   Fax:  [phone]  Physical Therapy Treatment  Patient Details  Name: Sheena Jarvis MRN: [id number] Date of Birth: 01/27/1971 Referring Provider (PT): Dr.Byerly    Encounter Date: 03/25/2019  PT End of Session - 03/25/19 1010    Visit Number  6    Number of Visits  9    Date for PT Re-Evaluation  04/13/19    PT Start Time  0902    PT Stop Time  0958    PT Time Calculation (min)  56 min    Activity Tolerance  Patient tolerated treatment well    Behavior During Therapy  [facility] for tasks assessed/performed       Past Medical History:  Diagnosis Date  . Pneumonia   . Vision abnormalities     Past Surgical History:  Procedure Laterality Date  . APPENDECTOMY    . BREAST CYST ASPIRATION Left   . HIP ARTHROSCOPY    . MASS EXCISION Left 02/10/2019   Procedure: EXCISION OF LEFT BREAST MASS;  Surgeon: Stark Klein, MD;  Location: [place];  Service: General;  Laterality: Left;    There were no vitals filed for this visit.  Subjective Assessment - 03/25/19 1002    Subjective  The biggest thing is the median nerve pain and cording.  This new bra seems to really help the swelling that does not seem so bad    Pertinent History  Pt has removed of benign breast mass on 16/01/4502 with complication of seroma and clogged drain. She is fearful it may be getting infected and has just started an antibiotic, but doesn't know if she will be able to tolerate it. She has been wearing her post op binder with added compression. Her drain site has just stopped draining.  She can feel sharp pain from her drain site to her nipple when the seroma starts to fill up She is concerned pt tends to scar down    Currently in Pain?  No/denies                       [facility] Adult PT Treatment/Exercise - 03/25/19 0001      Manual Therapy   Manual  therapy comments  attempted sidegliding C6-C7 3x20" to see if this decreases neural tension no decrease but pt did feel it in the arm wit mobiilzation    Soft tissue mobilization  to the latissimus, teres minor, UT, and pectoralis    Myofascial Release  to left axilla and incisions. cording release along the arm starting in the axilla and moving towards the elbow following 3 cords.  Arm positioned in 90deg horizontal abduction.  Breast blocking with overhead flexion AROM and cording release by the opposite hand by PT    Manual Lymphatic Drainage (MLD)  sidleying stationary circles towards the left hip and posterior interaxillary work.      Kinesiotex  Facilitate Muscle      Kinesiotix   Edema  skin cote then 1 I strip following mid rhonboid and 1 I strip over left shoulder into scapula for depression.  both applied with bil ER and depression                  PT Long Term Goals - 03/14/19 1253      PT LONG TERM GOAL #1   Title  Pt will be independent in sel MLD and  use of compression to manage fullness in left upper quadrant    Time  4    Period  Weeks    Status  New      PT LONG TERM GOAL #2   Title  Pt will have 150 degrees of left shoulder flexion and abduction so that she can return to normal activities    Time  4    Period  Weeks    Status  New            Plan - 03/25/19 1011    Clinical Impression Statement  Pt feeling much better today.  Improvements in breast swelling with better compression bra, less breast pain, and improved axillary adhesions from when last seen by this PT.  Focused again on cording and axillary adhesions. Did have some neural tension reproductions with sideglides of C6-7 so these were added today and we discussed self mobilization with sustained PA pressure or sideglide pressure.    PT Frequency  3x / week    PT Duration  4 weeks    PT Treatment/Interventions  ADLs/Self Care Home Management;Therapeutic exercise;Orthotic  Fit/Training;Patient/family education;Manual techniques;Manual lymph drainage;Passive range of motion;Scar mobilization;Taping;Functional mobility training;DME Instruction;Therapeutic activities    PT Next Visit Plan  how was scapular tape? try some scapular manual isometrics, perform and review in MLD, manual work with release and soft tissue work to left shoulder as tolerated lats and pec major/minor, manual work to cording  Gradually increase ROM    Recommended Other Services  has bras, sent Dr. Donell Beers a note for compression sleeve and pt will try to stop over there today to get arm compression    Consulted and Agree with Plan of Care  Patient       Patient will benefit from skilled therapeutic intervention in order to improve the following deficits and impairments:     Visit Diagnosis: Aftercare following surgery for neoplasm  Stiffness of left shoulder, not elsewhere classified  Muscle weakness (generalized)  Acute pain of left shoulder     Problem List Patient Active Problem List   Diagnosis Date Noted  . Numbness 02/04/2016  . Cognitive changes 02/04/2016  . Other fatigue 02/04/2016    Sheena Jarvis 03/25/2019, 10:14 AM  [facility] [address] [place], [place], [address] Phone: [phone]   Fax:  [phone]  Name: Sheena Jarvis MRN: [id number] Date of Birth: [birth year]

## 2019-03-28 ENCOUNTER — Ambulatory Visit: Payer: Federal, State, Local not specified - PPO

## 2019-03-28 ENCOUNTER — Other Ambulatory Visit: Payer: Self-pay

## 2019-03-28 DIAGNOSIS — M25612 Stiffness of left shoulder, not elsewhere classified: Secondary | ICD-10-CM | POA: Diagnosis not present

## 2019-03-28 DIAGNOSIS — M6281 Muscle weakness (generalized): Secondary | ICD-10-CM | POA: Diagnosis not present

## 2019-03-28 DIAGNOSIS — Z483 Aftercare following surgery for neoplasm: Secondary | ICD-10-CM | POA: Diagnosis not present

## 2019-03-28 DIAGNOSIS — M25512 Pain in left shoulder: Secondary | ICD-10-CM

## 2019-03-28 NOTE — Therapy (Signed)
[facility] [address] [place], [place], [address] Phone: [phone]   Fax:  [phone]  Physical Therapy Treatment  Patient Details  Name: Sheena Jarvis MRN: [id number] Date of Birth: 22-Feb-1971 Referring Provider (PT): Dr.Byerly    Encounter Date: 03/28/2019  PT End of Session - 03/28/19 0808    Visit Number  7    Number of Visits  9    Date for PT Re-Evaluation  04/13/19    PT Start Time  0804    PT Stop Time  0900    PT Time Calculation (min)  56 min    Activity Tolerance  Patient tolerated treatment well    Behavior During Therapy  [facility] for tasks assessed/performed       Past Medical History:  Diagnosis Date  . Pneumonia   . Vision abnormalities     Past Surgical History:  Procedure Laterality Date  . APPENDECTOMY    . BREAST CYST ASPIRATION Left   . HIP ARTHROSCOPY    . MASS EXCISION Left 02/10/2019   Procedure: EXCISION OF LEFT BREAST MASS;  Surgeon: Almond Lint, MD;  Location: [facility];  Service: General;  Laterality: Left;    There were no vitals filed for this visit.  Subjective Assessment - 03/28/19 0809    Subjective  Pt reports that she was performing abduction and scapular protraction over the weekend and felt a searing pain in her arm that goes to her elbow and into her L breast.    Pertinent History  Pt has removed of benign breast mass on 02/10/2019 with complication of seroma and clogged drain. She is fearful it may be getting infected and has just started an antibiotic, but doesn't know if she will be able to tolerate it. She has been wearing her post op binder with added compression. Her drain site has just stopped draining.  She can feel sharp pain from her drain site to her nipple when the seroma starts to fill up She is concerned pt tends to scar down    Patient Stated Goals  To get her arm back to normal    Currently in Pain?  No/denies                        [facility] Adult PT Treatment/Exercise - 03/28/19 0001      Manual Therapy   Manual Therapy  Myofascial release;Passive ROM;Manual Lymphatic Drainage (MLD)    Myofascial Release  Myofascial releaset from the L lateral breast to the distal L brachium and in the axilla with cross friction and longitudinal with visible decrease in cording with work that improved pt ability to get into increased positions of abdct in supine w/P/ROM she continues with increased difficulty getting into abdct in standing A/ROM    Manual Lymphatic Drainage (MLD)  in Supine following myofascial release: short neck, Bil axillary, Bil inguinal nodes. L axillo-inguinal anastamosis, Anterior interaxillary anastamosis, L superior breast, L anterior interaxillary anastomosis, L inferior breast, L axillo-inguinal anastamosis, L medial/lateral brachium then lateral brachium. Re-worked all surfaces, 5 deep diaphragmatic breathes.     Passive ROM  To L shoulder in supine into incremental abduction, ER and flexion pain-free with end range stretch and myofascial release dueing ER/abduction             PT Education - 03/28/19 1015    Education Details  Pt will continue with current HEP and MLD.    Person(s) Educated  Patient  Methods  Explanation    Comprehension  Verbalized understanding          PT Long Term Goals - 03/14/19 1253      PT LONG TERM GOAL #1   Title  Pt will be independent in sel MLD and use of compression to manage fullness in left upper quadrant    Time  4    Period  Weeks    Status  New      PT LONG TERM GOAL #2   Title  Pt will have 150 degrees of left shoulder flexion and abduction so that she can return to normal activities    Time  4    Period  Weeks    Status  New            Plan - 03/28/19 0808    Clinical Impression Statement  Pt continues with limitations into abduction and states that she had a searing pain with abduction and scapular  protraction over the weekend. Focus on cording/axillray and L chest wall adhesions with good results as demonstrated by putting pt into abduction and ER visible cording inititally visible; non-visible following myofascial release. Continues with visible cord with stretching the myofascial tissue; increased dense tissue in the L lateral breast decreased significantly following myofascial release. MLD performed following myofascial release in order to facilitate lymphatic flow and decrease risk for increased edema. Pt will benefit from continued POC at this time.    Personal Factors and Comorbidities  Comorbidity 2    Comorbidities  breast surgery with seroma    Examination-Activity Limitations  Reach Overhead    PT Frequency  3x / week    PT Duration  4 weeks    PT Treatment/Interventions  ADLs/Self Care Home Management;Therapeutic exercise;Orthotic Fit/Training;Patient/family education;Manual techniques;Manual lymph drainage;Passive range of motion;Scar mobilization;Taping;Functional mobility training;DME Instruction;Therapeutic activities    PT Next Visit Plan  Myofascial release across the anterior chest from axilla to axilla and from L axilla to L ASIS at the pelvis,  scapular isometrics, perform and review in MLD, manual work with release and soft tissue work to left shoulder as tolerated lats and pec major/minor, manual work to cording  Gradually increase ROM       Patient will benefit from skilled therapeutic intervention in order to improve the following deficits and impairments:  Pain, Impaired UE functional use, Impaired flexibility, Increased fascial restricitons, Decreased strength, Decreased knowledge of use of DME, Decreased range of motion, Decreased scar mobility, Increased edema, Impaired perceived functional ability, Decreased skin integrity  Visit Diagnosis: Aftercare following surgery for neoplasm  Stiffness of left shoulder, not elsewhere classified  Muscle weakness  (generalized)  Acute pain of left shoulder     Problem List Patient Active Problem List   Diagnosis Date Noted  . Numbness 02/04/2016  . Cognitive changes 02/04/2016  . Other fatigue 02/04/2016    Ander Purpura , PT 03/28/2019, 10:47 AM  [place], [place], [address] Phone: [phone]   Fax:  [phone]  Name: Sheena Jarvis MRN: [id number] Date of Birth: 1970/10/13

## 2019-04-04 ENCOUNTER — Other Ambulatory Visit: Payer: Self-pay

## 2019-04-04 ENCOUNTER — Encounter: Payer: Self-pay | Admitting: Physical Therapy

## 2019-04-04 ENCOUNTER — Ambulatory Visit: Payer: Federal, State, Local not specified - PPO | Admitting: Physical Therapy

## 2019-04-04 DIAGNOSIS — M25512 Pain in left shoulder: Secondary | ICD-10-CM | POA: Diagnosis not present

## 2019-04-04 DIAGNOSIS — M6281 Muscle weakness (generalized): Secondary | ICD-10-CM | POA: Diagnosis not present

## 2019-04-04 DIAGNOSIS — Z483 Aftercare following surgery for neoplasm: Secondary | ICD-10-CM | POA: Diagnosis not present

## 2019-04-04 DIAGNOSIS — M25612 Stiffness of left shoulder, not elsewhere classified: Secondary | ICD-10-CM

## 2019-04-04 NOTE — Therapy (Signed)
[facility] [address] [place], [place], [address] Phone: [phone]   Fax:  [phone]  Physical Therapy Treatment  Patient Details  Name: Sheena Jarvis MRN: [id number] Date of Birth: 1970/08/20 Referring Provider (PT): Dr.Byerly    Encounter Date: 04/04/2019  PT End of Session - 04/04/19 0957    Visit Number  8    Number of Visits  13    Date for PT Re-Evaluation  04/13/19    PT Start Time  0800    PT Stop Time  0858    PT Time Calculation (min)  58 min    Activity Tolerance  Patient tolerated treatment well    Behavior During Therapy  [facility] for tasks assessed/performed       Past Medical History:  Diagnosis Date  . Pneumonia   . Vision abnormalities     Past Surgical History:  Procedure Laterality Date  . APPENDECTOMY    . BREAST CYST ASPIRATION Left   . HIP ARTHROSCOPY    . MASS EXCISION Left 02/10/2019   Procedure: EXCISION OF LEFT BREAST MASS;  Surgeon: Almond Lint, MD;  Location: [facility];  Service: General;  Laterality: Left;    There were no vitals filed for this visit.  Subjective Assessment - 04/04/19 0955    Subjective  Pt continues to do exercise and manual work at home. She noticed increased cording and pain after doing closed chain activities. She ordered her compression sleeve and continues to wear her compression bra and jovi pak    Pertinent History  Pt has removed of benign breast mass on 02/10/2019 with complication of seroma and clogged drain. She is fearful it may be getting infected and has just started an antibiotic, but doesn't know if she will be able to tolerate it. She has been wearing her post op binder with added compression. Her drain site has just stopped draining.  She can feel sharp pain from her drain site to her nipple when the seroma starts to fill up She is concerned pt tends to scar down    Patient Stated Goals  To get her arm back to normal    Currently in  Pain?  No/denies   pain with stretching into abduction and protraction                      [facility] Adult PT Treatment/Exercise - 04/04/19 0001      Self-Care   Self-Care  Other Self-Care Comments    Other Self-Care Comments   large dotted foam on white foam with band around axilla for increased       Exercises   Exercises  Other Exercises    Other Exercises   suggested that pt do low rows with tband for symmetrical scapluar stabalization and add small one pound weight to open chain exercises as she has congestion after closed chain activies    pt very knowledgable of exercise      Manual Therapy   Manual Therapy  Edema management;Myofascial release;Manual Lymphatic Drainage (MLD)    Manual therapy comments  added more foam and compression to tightness in axilla     Myofascial Release  with crossed hands and adjusting tension to response of tissues myofascial release to congestion in left breast and axilla with extra work around 2 smaller scars     Manual Lymphatic Drainage (MLD)  briefly to chest and left axillo interaxillary anastamosis and also to posterior interaxillary anastamosis in  right sidelying.              PT Education - 04/04/19 0957    Education Details  add small weights to open chain exercise    Person(s) Educated  Patient    Methods  Explanation;Demonstration    Comprehension  Verbalized understanding          PT Long Term Goals - 03/14/19 1253      PT LONG TERM GOAL #1   Title  Pt will be independent in sel MLD and use of compression to manage fullness in left upper quadrant    Time  4    Period  Weeks    Status  New      PT LONG TERM GOAL #2   Title  Pt will have 150 degrees of left shoulder flexion and abduction so that she can return to normal activities    Time  4    Period  Weeks    Status  New            Plan - 04/04/19 0957    Clinical Impression Statement  Pt is making gains in ROM , but she still has palpable  congestion in her breast with scar contraction at and above incisions with cording from this area that travels to her arm. She is having decreased strenght with scapular protraction and difficuly stablazing scapula with arm elevation.  She is benefiing from myofascial release.  Upgraded compression options today with large dotted foam and axillary compression strap    Comorbidities  breast surgery with seroma    PT Frequency  3x / week    PT Duration  4 weeks    PT Treatment/Interventions  ADLs/Self Care Home Management;Therapeutic exercise;Orthotic Fit/Training;Patient/family education;Manual techniques;Manual lymph drainage;Passive range of motion;Scar mobilization;Taping;Functional mobility training;DME Instruction;Therapeutic activities    PT Next Visit Plan  Myofascial release across the anterior chest from axilla to axilla and from L axilla to L ASIS at the pelvis,  scapular isometrics, perform and review in MLD, manual work with release and soft tissue work to left shoulder as tolerated lats and pec major/minor, manual work to cording  Gradually increase ROM       Patient will benefit from skilled therapeutic intervention in order to improve the following deficits and impairments:  Pain, Impaired UE functional use, Impaired flexibility, Increased fascial restricitons, Decreased strength, Decreased knowledge of use of DME, Decreased range of motion, Decreased scar mobility, Increased edema, Impaired perceived functional ability, Decreased skin integrity  Visit Diagnosis: Aftercare following surgery for neoplasm  Muscle weakness (generalized)  Stiffness of left shoulder, not elsewhere classified  Acute pain of left shoulder     Problem List Patient Active Problem List   Diagnosis Date Noted  . Numbness 02/04/2016  . Cognitive changes 02/04/2016  . Other fatigue 02/04/2016   Donato Heinz. Owens Shark PT  Norwood Levo 04/04/2019, 10:02 AM  [place], [place], [address] Phone: [phone]   Fax:  [phone]  Name: LOVELL ROE MRN: [id number] Date of Birth: Apr 05, 1971

## 2019-04-06 ENCOUNTER — Ambulatory Visit: Payer: Federal, State, Local not specified - PPO | Attending: General Surgery | Admitting: Physical Therapy

## 2019-04-06 ENCOUNTER — Other Ambulatory Visit: Payer: Self-pay

## 2019-04-06 ENCOUNTER — Encounter: Payer: Self-pay | Admitting: Physical Therapy

## 2019-04-06 DIAGNOSIS — M25512 Pain in left shoulder: Secondary | ICD-10-CM | POA: Insufficient documentation

## 2019-04-06 DIAGNOSIS — Z483 Aftercare following surgery for neoplasm: Secondary | ICD-10-CM | POA: Diagnosis not present

## 2019-04-06 DIAGNOSIS — M25612 Stiffness of left shoulder, not elsewhere classified: Secondary | ICD-10-CM | POA: Diagnosis not present

## 2019-04-06 DIAGNOSIS — M6281 Muscle weakness (generalized): Secondary | ICD-10-CM

## 2019-04-06 NOTE — Therapy (Signed)
[facility] [address] [place], [place], [address] Phone: [phone]   Fax:  [phone]  Physical Therapy Treatment  Patient Details  Name: Sheena Jarvis MRN: [id number] Date of Birth: August 29, 1970 Referring Provider (PT): Dr.Byerly    Encounter Date: 04/06/2019  PT End of Session - 04/06/19 1010    Visit Number  9    Number of Visits  13    Date for PT Re-Evaluation  04/13/19    PT Start Time  0800    PT Stop Time  0855    PT Time Calculation (min)  55 min       Past Medical History:  Diagnosis Date  . Pneumonia   . Vision abnormalities     Past Surgical History:  Procedure Laterality Date  . APPENDECTOMY    . BREAST CYST ASPIRATION Left   . HIP ARTHROSCOPY    . MASS EXCISION Left 02/10/2019   Procedure: EXCISION OF LEFT BREAST MASS;  Surgeon: Almond Lint, MD;  Location: [facility];  Service: General;  Laterality: Left;    There were no vitals filed for this visit.  Subjective Assessment - 04/06/19 0813    Subjective  Pt had some bad news about her grandmother who is now on Hospice, so she is emotionally upset today.  She feels that her arm is getting better    Pertinent History  Pt has removed of benign breast mass on 02/10/2019 with complication of seroma and clogged drain. She is fearful it may be getting infected and has just started an antibiotic, but doesn't know if she will be able to tolerate it. She has been wearing her post op binder with added compression. Her drain site has just stopped draining.  She can feel sharp pain from her drain site to her nipple when the seroma starts to fill up She is concerned pt tends to scar down    Patient Stated Goals  To get her arm back to normal    Currently in Pain?  No/denies   at rest, pain with abduction and protraction                      [facility] Adult PT Treatment/Exercise - 04/06/19 0001      Manual Therapy   Manual Therapy   Edema management;Myofascial release    Manual therapy comments  added more white  foam and compression to tightness in axilla for more focused compression to congested areas     Myofascial Release  with crossed hands and adjusting tension to response of tissues myofascial release to congestion in left breast and axilla with extra work around 2 smaller scars  worked in supine and sidelying to apply tension to tissues in various directions.  Today was able to get directly around smaller scars to apply tissue bending                   PT Long Term Goals - 03/14/19 1253      PT LONG TERM GOAL #1   Title  Pt will be independent in sel MLD and use of compression to manage fullness in left upper quadrant    Time  4    Period  Weeks    Status  New      PT LONG TERM GOAL #2   Title  Pt will have 150 degrees of left shoulder flexion and abduction so that she can return to normal activities  Time  4    Period  Weeks    Status  New            Plan - 04/06/19 1010    Clinical Impression Statement  Pt is making small steady gains with tissue remodeling with increased fascial mobility with myofascial release techniques and compression.  Thick  cord is visible and palpable proximally with guitar string characteristics in axilla and into upper arm. Pt very compliant with compression and exercise and soft tissue work on her own.    Comorbidities  breast surgery with seroma    Examination-Activity Limitations  Reach Overhead    Stability/Clinical Decision Making  Stable/Uncomplicated    Clinical Decision Making  Low    PT Frequency  3x / week    PT Duration  4 weeks    PT Treatment/Interventions  ADLs/Self Care Home Management;Therapeutic exercise;Orthotic Fit/Training;Patient/family education;Manual techniques;Manual lymph drainage;Passive range of motion;Scar mobilization;Taping;Functional mobility training;DME Instruction;Therapeutic activities    PT Next Visit Plan  Cointinue  Myofascial release across the anterior chest from axilla to axilla and from L axilla to L ASIS at the pelvis,  scapular isometrics, perform and review in MLD, manual work with release and soft tissue work to left shoulder as tolerated lats and pec major/minor, manual work to cording  Gradually increase ROM       Patient will benefit from skilled therapeutic intervention in order to improve the following deficits and impairments:  Pain, Impaired UE functional use, Impaired flexibility, Increased fascial restricitons, Decreased strength, Decreased knowledge of use of DME, Decreased range of motion, Decreased scar mobility, Increased edema, Impaired perceived functional ability, Decreased skin integrity  Visit Diagnosis: Aftercare following surgery for neoplasm  Muscle weakness (generalized)  Stiffness of left shoulder, not elsewhere classified  Acute pain of left shoulder     Problem List Patient Active Problem List   Diagnosis Date Noted  . Numbness 02/04/2016  . Cognitive changes 02/04/2016  . Other fatigue 02/04/2016   Donato Heinz. Owens Shark PT  Norwood Levo 04/06/2019, 10:21 AM  [place], [place], [address] Phone: [phone]   Fax:  [phone]  Name: Sheena Jarvis MRN: [id number] Date of Birth: [birth year]

## 2019-04-07 ENCOUNTER — Ambulatory Visit: Payer: Federal, State, Local not specified - PPO | Admitting: Physical Therapy

## 2019-04-07 ENCOUNTER — Encounter: Payer: Self-pay | Admitting: Physical Therapy

## 2019-04-07 DIAGNOSIS — Z483 Aftercare following surgery for neoplasm: Secondary | ICD-10-CM

## 2019-04-07 DIAGNOSIS — M6281 Muscle weakness (generalized): Secondary | ICD-10-CM

## 2019-04-07 DIAGNOSIS — M25612 Stiffness of left shoulder, not elsewhere classified: Secondary | ICD-10-CM

## 2019-04-07 DIAGNOSIS — M25512 Pain in left shoulder: Secondary | ICD-10-CM

## 2019-04-07 NOTE — Therapy (Signed)
[place], [place], [address] Phone: [phone]   Fax:  [phone]  Physical Therapy Treatment  Patient Details  Name: Sheena Jarvis MRN: [id number] Date of Birth: 10/14/1970 Referring Provider (PT): Dr.Byerly   Progress Note Reporting Period 03/14/2019  to 04/07/2019  See note below for Objective Data and Assessment of Progress/Goals.      Encounter Date: 04/07/2019  PT End of Session - 04/07/19 1619    Visit Number  10    Number of Visits  13    Date for PT Re-Evaluation  04/13/19    PT Start Time  1508    PT Stop Time  1558    PT Time Calculation (min)  50 min    Activity Tolerance  Patient tolerated treatment well    Behavior During Therapy  [facility] for tasks assessed/performed       Past Medical History:  Diagnosis Date  . Pneumonia   . Vision abnormalities     Past Surgical History:  Procedure Laterality Date  . APPENDECTOMY    . BREAST CYST ASPIRATION Left   . HIP ARTHROSCOPY    . MASS EXCISION Left 02/10/2019   Procedure: EXCISION OF LEFT BREAST MASS;  Surgeon: Stark Klein, MD;  Location: [place];  Service: General;  Laterality: Left;    There were no vitals filed for this visit.  Subjective Assessment - 04/07/19 1612    Subjective  Pt has been at her desk most of the day and feels that she gets tighter because of this,  She does feel that she is making progress    Pertinent History  Pt has removed of benign breast mass on 39/11/6732 with complication of seroma and clogged drain. She is fearful it may be getting infected and has just started an antibiotic, but doesn't know if she will be able to tolerate it. She has been wearing her post op binder with added compression. Her drain site has just stopped draining.  She can feel sharp pain from her drain site to her nipple when the seroma starts to fill up She is concerned pt tends to scar down    Currently in Pain?   No/denies                       [facility] Adult PT Treatment/Exercise - 04/07/19 0001      Manual Therapy   Manual Therapy  Edema management;Myofascial release;Scapular mobilization    Myofascial Release  with crossed hands and adjusting tension to response of tissues myofascial release to congestion in left breast and axilla with extra work around 2 smaller scars  worked in supine and sidelying to apply tension to tissues in various directions.  Today was able to get directly around smaller scars to apply tissue bending  pt had good release especially in sidelying.  Tried to pick up scars and was able to pick  up larger scar about 75% but 2 smaller scars are still tethered     Scapular Mobilization  scapulare mobilization in all directions to assist with myofascial release                   PT Long Term Goals - 04/07/19 1622      PT LONG TERM GOAL #1   Title  Pt will be independent in sel MLD and use of compression to manage fullness in left upper quadrant    Status  Achieved  PT LONG TERM GOAL #2   Title  Pt will have 150 degrees of left shoulder flexion and abduction so that she can return to normal activities    Time  4    Period  Weeks    Status  On-going            Plan - 04/07/19 1620    Clinical Impression Statement  Pt has been wearing large dotted foam patch and is seems to be making improvment in decreasing the thickness in her lateral breast and axilla, but the pad does not reach far enough into her axilla to apply compression to the smaller scars.  Overall though there is improvment with less thickness of cords and more ROM since yesterday, but it is still present.    PT Next Visit Plan  Cointinue Myofascial release across the anterior chest from axilla to axilla and from L axilla to L ASIS at the pelvis,  scapular isometrics, perform and review in MLD, manual work with release and soft tissue work to left shoulder as tolerated lats and pec  major/minor, manual work to cording  Gradually increase ROM       Patient will benefit from skilled therapeutic intervention in order to improve the following deficits and impairments:  Pain, Impaired UE functional use, Impaired flexibility, Increased fascial restricitons, Decreased strength, Decreased knowledge of use of DME, Decreased range of motion, Decreased scar mobility, Increased edema, Impaired perceived functional ability, Decreased skin integrity  Visit Diagnosis: Aftercare following surgery for neoplasm  Muscle weakness (generalized)  Stiffness of left shoulder, not elsewhere classified  Acute pain of left shoulder     Problem List Patient Active Problem List   Diagnosis Date Noted  . Numbness 02/04/2016  . Cognitive changes 02/04/2016  . Other fatigue 02/04/2016   Bayard Hugger. Manson Passey PT  Donnetta Hail 04/07/2019, 4:23 PM  [facility] [address] [place], [place], [address] Phone: [phone]   Fax:  [phone]  Name: Sheena Jarvis MRN: [id number] Date of Birth: 1970-05-31

## 2019-04-11 ENCOUNTER — Encounter: Payer: Self-pay | Admitting: Physical Therapy

## 2019-04-11 ENCOUNTER — Ambulatory Visit: Payer: Federal, State, Local not specified - PPO | Admitting: Physical Therapy

## 2019-04-11 ENCOUNTER — Other Ambulatory Visit: Payer: Self-pay

## 2019-04-11 DIAGNOSIS — Z483 Aftercare following surgery for neoplasm: Secondary | ICD-10-CM

## 2019-04-11 DIAGNOSIS — M25512 Pain in left shoulder: Secondary | ICD-10-CM | POA: Diagnosis not present

## 2019-04-11 DIAGNOSIS — M6281 Muscle weakness (generalized): Secondary | ICD-10-CM

## 2019-04-11 DIAGNOSIS — M25612 Stiffness of left shoulder, not elsewhere classified: Secondary | ICD-10-CM | POA: Diagnosis not present

## 2019-04-11 NOTE — Therapy (Signed)
[place], [place], [address] Phone: [phone]   Fax:  [phone]  Physical Therapy Treatment  Patient Details  Name: Sheena Jarvis MRN: [id number] Date of Birth: 1971/02/28 Referring Provider (PT): Dr.Byerly    Encounter Date: 04/11/2019  PT End of Session - 04/11/19 1306    Visit Number  11    Number of Visits  13    Date for PT Re-Evaluation  04/13/19    PT Start Time  0800    PT Stop Time  0856    PT Time Calculation (min)  56 min    Activity Tolerance  Patient tolerated treatment well       Past Medical History:  Diagnosis Date  . Pneumonia   . Vision abnormalities     Past Surgical History:  Procedure Laterality Date  . APPENDECTOMY    . BREAST CYST ASPIRATION Left   . HIP ARTHROSCOPY    . MASS EXCISION Left 02/10/2019   Procedure: EXCISION OF LEFT BREAST MASS;  Surgeon: Stark Klein, MD;  Location: [place];  Service: General;  Laterality: Left;    There were no vitals filed for this visit.  Subjective Assessment - 04/11/19 0804    Subjective  Pts grandmother passed away on Sep 10, 2022. Her compression is in the laundry this morning so she does not have it on, but she feels that the compression helps.  She does not have pain, just fullness this am.    Pertinent History  Pt has removed of benign breast mass on 16/0/1093 with complication of seroma and clogged drain. She is fearful it may be getting infected and has just started an antibiotic, but doesn't know if she will be able to tolerate it. She has been wearing her post op binder with added compression. Her drain site has just stopped draining.  She can feel sharp pain from her drain site to her nipple when the seroma starts to fill up She is concerned pt tends to scar down    Currently in Pain?  No/denies                       [facility] Adult PT Treatment/Exercise - 04/11/19 0001      Manual Therapy    Manual Therapy  Soft tissue mobilization;Myofascial release;Manual Lymphatic Drainage (MLD)    Manual therapy comments  thickiness noted in left breast that pt feels is along duct area.  Since she has responded to thick dotted foam above this area added small dotted foam to this area in addition to Jovi pak.Gave piece of thin foam also if pt needs more compression to compensate for the contour of her body     Soft tissue mobilization  able to "pinch and pick up"  thick cording tissue that allowed more aggressve tissue bending and soft tissue mobilization to this area today    Myofascial Release  with crossed hands and adjusting tension to response of tissues myofascial release to congestion in left breast and axilla with extra work around 2 smaller scars  worked in supine and sidelying to apply tension to tissues in various directions.  Today was able to get directly around smaller scars to apply tissue bending  pt had good release especially in sidelying.  Tried to pick up scars and was able to pick  up larger scar about 75% but 2 smaller scars are still tethered     Manual Lymphatic Drainage (MLD)  briefly to chest and left axillo interaxillary anastamosis and also to posterior interaxillary anastamosis in right sidelying.                   PT Long Term Goals - 04/07/19 1622      PT LONG TERM GOAL #1   Title  Pt will be independent in sel MLD and use of compression to manage fullness in left upper quadrant    Status  Achieved      PT LONG TERM GOAL #2   Title  Pt will have 150 degrees of left shoulder flexion and abduction so that she can return to normal activities    Time  4    Period  Weeks    Status  On-going            Plan - 04/11/19 1306    Clinical Impression Statement  Pt continues to make slow steady gains. Area of axilla is softening overall, but cording is still present.  She responds well to compression and it was upgraded today. Also discussed adding modulated  closed chain activities at the wall  to her program that she can do at home    Comorbidities  breast surgery with seroma    Examination-Activity Limitations  Reach Overhead    PT Treatment/Interventions  ADLs/Self Care Home Management;Therapeutic exercise;Orthotic Fit/Training;Patient/family education;Manual techniques;Manual lymph drainage;Passive range of motion;Scar mobilization;Taping;Functional mobility training;DME Instruction;Therapeutic activities    PT Next Visit Plan  remeasure and reassess for goals send recert.Cointinue Myofascial release across the anterior chest from axilla to axilla and from L axilla to L ASIS at the pelvis,  scapular isometrics, perform and review in MLD, manual work with release and soft tissue work to left shoulder as tolerated lats and pec major/minor, manual work to cording  Gradually increase ROM    Consulted and Agree with Plan of Care  Patient       Patient will benefit from skilled therapeutic intervention in order to improve the following deficits and impairments:  Pain, Impaired UE functional use, Impaired flexibility, Increased fascial restricitons, Decreased strength, Decreased knowledge of use of DME, Decreased range of motion, Decreased scar mobility, Increased edema, Impaired perceived functional ability, Decreased skin integrity  Visit Diagnosis: Aftercare following surgery for neoplasm  Muscle weakness (generalized)  Acute pain of left shoulder  Stiffness of left shoulder, not elsewhere classified     Problem List Patient Active Problem List   Diagnosis Date Noted  . Numbness 02/04/2016  . Cognitive changes 02/04/2016  . Other fatigue 02/04/2016   Bayard Hugger. Manson Passey PT  Donnetta Hail 04/11/2019, 1:13 PM  [facility] [address] [place], [place], [address] Phone: [phone]   Fax:  [phone]  Name: Sheena Jarvis MRN: [id number] Date of Birth: 1971-01-02

## 2019-04-13 ENCOUNTER — Ambulatory Visit: Payer: Federal, State, Local not specified - PPO | Admitting: Physical Therapy

## 2019-04-13 ENCOUNTER — Other Ambulatory Visit: Payer: Self-pay

## 2019-04-13 DIAGNOSIS — M6281 Muscle weakness (generalized): Secondary | ICD-10-CM | POA: Diagnosis not present

## 2019-04-13 DIAGNOSIS — M25612 Stiffness of left shoulder, not elsewhere classified: Secondary | ICD-10-CM | POA: Diagnosis not present

## 2019-04-13 DIAGNOSIS — M25512 Pain in left shoulder: Secondary | ICD-10-CM

## 2019-04-13 DIAGNOSIS — Z483 Aftercare following surgery for neoplasm: Secondary | ICD-10-CM

## 2019-04-13 NOTE — Therapy (Signed)
[facility] [address] [place], [place], [address] Phone: [phone]   Fax:  [phone]  Physical Therapy Treatment  Patient Details  Name: SHONDRA CAPPS MRN: [id number] Date of Birth: [birth year] Referring Provider (PT): Dr.Byerly    Encounter Date: 04/13/2019  PT End of Session - 04/13/19 0927    Visit Number  12    Number of Visits  37   expect pt to not keep all thes visits   Date for PT Re-Evaluation  06/15/19    PT Start Time  0802    PT Stop Time  0900    PT Time Calculation (min)  58 min    Activity Tolerance  Patient tolerated treatment well    Behavior During Therapy  [facility] for tasks assessed/performed       Past Medical History:  Diagnosis Date  . Pneumonia   . Vision abnormalities     Past Surgical History:  Procedure Laterality Date  . APPENDECTOMY    . BREAST CYST ASPIRATION Left   . HIP ARTHROSCOPY    . MASS EXCISION Left 02/10/2019   Procedure: EXCISION OF LEFT BREAST MASS;  Surgeon: Almond Lint, MD;  Location: [facility];  Service: General;  Laterality: Left;    There were no vitals filed for this visit.      [facility] PT Assessment - 04/13/19 0001      AROM   Overall AROM Comments  pt has visible forward humeral head     Right Shoulder Flexion  140 Degrees    Right Shoulder ABduction  125 Degrees    Left Shoulder Flexion  140 Degrees    Left Shoulder ABduction  125 Degrees   cording obvious, feels pulling into arm    Left Shoulder Internal Rotation  40 Degrees   measured in supine    Left Shoulder External Rotation  100 Degrees   measured in supine      Strength   Overall Strength Comments    She also has scapular dyskinesia with impaired scapular-humeral rythm impairing active movement She has 5/5 strength to isometric testing to all groups so does not appear to have tendinopathy at this time                       [facility] Adult PT Treatment/Exercise -  04/13/19 0001      Manual Therapy   Manual Therapy  Joint mobilization;Soft tissue mobilization;Myofascial release;Manual Lymphatic Drainage (MLD)    Manual therapy comments  palpable thick cord proximally with palpable and visible guitar string cording into upper arm.     Joint Mobilization  to S-C, A-C and G-H joint . Pt had good excursion with empty end feel  Traction also applied .     Soft tissue mobilization  able to "pinch and pick up"  thick cording tissue that allowed more aggressve tissue bending and soft tissue mobilization to this area today prolonged pressure at subscapularis muscle     Myofascial Release  with crossed hands and adjusting tension to response of tissues myofascial release to congestion in left breast and axilla with extra work around 2 smaller scars  worked in supine and sidelying to apply tension to tissues in various directions.  Today was able to get directly around smaller scars to apply tissue bending  pt had good release especially in sidelying.  Tried to pick up scars and was able to pick  up larger scar about 75% but 2  smaller scars are still tethered     Passive ROM  To L shoulder in supine into incremental abduction, ER and flexion pain-free with end range stretch and myofascial release during ER/abduction                   PT Long Term Goals - 04/13/19 [id number]      PT LONG TERM GOAL #1   Title  Pt will be independent in sel MLD and use of compression to manage fullness in left upper quadrant    Time  4    Period  Weeks    Status  Achieved      PT LONG TERM GOAL #2   Title  Pt will have 150 degrees of left shoulder flexion and abduction so that she can return to normal activities    Baseline  on eval, 130 degrees flexion and 90 degrees of abduction . 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 with no increase in pain or cording    Time  8    Period  Weeks    Status  New      PT LONG TERM GOAL #4   Title  Pt will have decrease in visual and palpable axillary cording by 50%  per photo and verbal report    Time  8    Period  Weeks    Status  New            Plan - 04/13/19 [id number]    Clinical Impression Statement  Pt has been making steady gains with manual therapy and exercise at home, but she persists with palpable congestion in breast along surgical site and duct with visible, palpble cording that is thick proximally near scar and thins to guitar string type into axilla and upper arm. She no longer complains of pain down into her wrist and hand. She has been wearing compression bra daily with supplementaion of extra foam to congeted areas as well as compression sleeve on her arm.  She has been doing mobility exercises with scapular retraining and finds that with closed chain activities ( leaning on her arm during desk work) her cording is exacerbated.  She continues to have decreased ROM and function in her arm. She has developed forward migration of her humeral head and is at risk of developing anterior shoulder impingement pain.  She always feels looser and better after PT, but her symptoms return within hours especailly if she does closed chain activities.  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 Have sent a recert today for 8 weeks as her recoverey seems to be slow and will need skilled manual PT in addition to the exercise she does at home.  Also included dry needling in the plan as this treatment may benefit her cording and scar tissue.    Personal Factors and Comorbidities  Comorbidity 2    Comorbidities  breast surgery with seroma. previous history of increased scarring after other surgeries    Examination-Activity Limitations  Reach Overhead    PT Frequency  3x / week   decreasing to 2x  per week   PT Duration  8 weeks    PT Treatment/Interventions  ADLs/Self Care Home Management;Therapeutic exercise;Orthotic Fit/Training;Patient/family education;Manual techniques;Manual lymph drainage;Passive range of motion;Scar mobilization;Taping;Functional mobility training;DME Instruction;Therapeutic activities;Neuromuscular re-education;Dry needling    PT Next Visit Plan  Continue MLD, joint mobilizaion and myofascial release  with release and soft tissue work to left shoulder as tolerated.  Manual work to cording  Increase ROM and progress strengthening  Consider dry needling.    Consulted and Agree with Plan of Care  Patient       Patient will benefit from skilled therapeutic intervention in order to improve the following deficits and impairments:  Pain, Impaired UE functional use, Impaired flexibility, Increased fascial restricitons, Decreased strength, Decreased knowledge of use of DME, Decreased range of motion, Decreased scar mobility, Increased edema, Impaired perceived functional ability, Decreased skin integrity, Postural dysfunction  Visit Diagnosis: Aftercare following surgery for neoplasm  Muscle weakness (generalized)  Acute pain of left shoulder  Stiffness of left shoulder, not elsewhere classified     Problem List Patient Active Problem List   Diagnosis Date Noted  . Numbness 02/04/2016  . Cognitive changes 02/04/2016  . Other fatigue 02/04/2016   Donato Heinz. Owens Shark PT  Norwood Levo 04/13/2019, 9:44 AM  [place], [place], [address] Phone: [phone]   Fax:  [phone]  Name: KIANTE PETROVICH MRN: [id number] Date of Birth: [birth year]

## 2019-04-14 ENCOUNTER — Encounter: Payer: Self-pay | Admitting: Physical Therapy

## 2019-04-14 ENCOUNTER — Ambulatory Visit: Payer: Federal, State, Local not specified - PPO | Admitting: Physical Therapy

## 2019-04-14 DIAGNOSIS — M25512 Pain in left shoulder: Secondary | ICD-10-CM

## 2019-04-14 DIAGNOSIS — M25612 Stiffness of left shoulder, not elsewhere classified: Secondary | ICD-10-CM

## 2019-04-14 DIAGNOSIS — Z483 Aftercare following surgery for neoplasm: Secondary | ICD-10-CM | POA: Diagnosis not present

## 2019-04-14 DIAGNOSIS — M6281 Muscle weakness (generalized): Secondary | ICD-10-CM

## 2019-04-14 NOTE — Therapy (Signed)
[place], [place], [address] Phone: [phone]   Fax:  [phone]  Physical Therapy Treatment  Patient Details  Name: Sheena Jarvis MRN: [id number] Date of Birth: 1970/11/30 Referring Provider (PT): Dr.Byerly    Encounter Date: 04/14/2019  PT End of Session - 04/14/19 1632    Visit Number  13    Number of Visits  37    Date for PT Re-Evaluation  06/15/19    PT Start Time  1300    PT Stop Time  1359    PT Time Calculation (min)  59 min    Activity Tolerance  Patient tolerated treatment well    Behavior During Therapy  [facility] for tasks assessed/performed       Past Medical History:  Diagnosis Date  . Pneumonia   . Vision abnormalities     Past Surgical History:  Procedure Laterality Date  . APPENDECTOMY    . BREAST CYST ASPIRATION Left   . HIP ARTHROSCOPY    . MASS EXCISION Left 02/10/2019   Procedure: EXCISION OF LEFT BREAST MASS;  Surgeon: Stark Klein, MD;  Location: [facility];  Service: General;  Laterality: Left;    There were no vitals filed for this visit.  Subjective Assessment - 04/14/19 1306    Subjective  Pt comes in today wearing her compression bra with large dotted foam that does not get all the way up in to the axilla around the incisions.    Pertinent History  Pt has removed of benign breast mass on 15/05/7614 with complication of seroma and clogged drain. She is fearful it may be getting infected and has just started an antibiotic, but doesn't know if she will be able to tolerate it. She has been wearing her post op binder with added compression. Her drain site has just stopped draining.  She can feel sharp pain from her drain site to her nipple when the seroma starts to fill up She is concerned pt tends to scar down    Currently in Pain?  No/denies   not at rest                      [facility] Adult PT Treatment/Exercise - 04/14/19 0001      Exercises   Exercises  Shoulder      Shoulder Exercises: Sidelying   Other Sidelying Exercises  small circles with hand pointed to ceiling, but pt had decreased scapular control and coordinated movement       Shoulder Exercises: Standing   External Rotation  Strengthening;Right;Left;10 reps;Theraband    Theraband Level (Shoulder External Rotation)  Level 2 (Red)    External Rotation Limitations  folded towel at sides under arm with cues for pt to stabalize scapulae     Row  Strengthening;Right;Left;5 reps;Theraband    Theraband Level (Shoulder Row)  Level 2 (Red)    Row Limitations  pt states she still feels that left scapula is lifting off back     Other Standing Exercises  attempted to do walkouts with red theraband but pt was not able to conrol stabalization of scapula     Other Standing Exercises  attempted closed chain pushing purple ball into wall with small circles, but pt had moderate scapular winging evident on the left       Modalities   Modalities  Moist Heat      Moist Heat Therapy   Moist Heat Location  --  used moist heat intermittently during session at cording      Manual Therapy   Manual Therapy  Joint mobilization;Soft tissue mobilization;Myofascial release;Manual Lymphatic Drainage (MLD)    Manual therapy comments  palpable thick cord proximally with palpable and visible guitar string cording into upper arm.     Joint Mobilization  to S-C, A-C and G-H joint . Pt had good excursion with empty end feel  Traction also applied .     Soft tissue mobilization  able to "pinch and pick up"  thick cording tissue that allowed more aggressve tissue bending and soft tissue mobilization to this area today prolonged pressure at subscapularis muscle     Myofascial Release  with crossed hands and adjusting tension to response of tissues myofascial release to congestion in left breast and axilla with extra work around 2 smaller scars  worked in supine and sidelying to apply tension to  tissues in various directions.  Today was able to get directly around smaller scars to apply tissue bending  pt had good release especially in sidelying.  Tried to pick up scars and was able to pick  up larger scar about 75% but 2 smaller scars are still tethered     Passive ROM  To L shoulder in supine into incremental abduction, ER and flexion pain-free with end range stretch and myofascial release during ER/abduction                   PT Long Term Goals - 04/13/19 [id number]      PT LONG TERM GOAL #1   Title  Pt will be independent in sel MLD and use of compression to manage fullness in left upper quadrant    Time  4    Period  Weeks    Status  Achieved      PT LONG TERM GOAL #2   Title  Pt will have 150 degrees of left shoulder flexion and abduction so that she can return to normal activities    Baseline  on eval, 130 degrees flexion and 90 degrees of abduction . 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  Used moist heat today as it may help if there is phlebatic component to cording and pt reports she does use it at home. She gets release with myofascial relase along latera trunk, but cording persists sometimes with sensations of pulling to elbow. Today, she demonstrated dificulty with scapular stabalization and control of scaplua with shoulder movement so add external rotation and low rows to activate traps.  Pt report  she has been doing exercises  at home but may need to focus  on these here as well to monitor scapular mobility    Comorbidities  breast surgery with seroma. previous history of increased scarring after other surgeries    Stability/Clinical Decision Making  Stable/Uncomplicated    PT Frequency  3x / week    PT Duration  8 weeks    PT Treatment/Interventions  ADLs/Self Care Home Management;Therapeutic exercise;Orthotic Fit/Training;Patient/family education;Manual techniques;Manual lymph drainage;Passive range of motion;Scar mobilization;Taping;Functional mobility training;DME Instruction;Therapeutic activities;Neuromuscular re-education;Dry needling    PT Next Visit Plan  Continue MLD, joint mobilizaion and myofascial release  with release and soft tissue work to left shoulder as tolerated. use moist heat to cording as needed monitoring effectiveness. Manual work to cording  Increase ROM and progress strengthening starting with isometrics monitoring scapular control   Consider dry needling.    Consulted and Agree with Plan of Care  Patient       Patient will benefit from skilled therapeutic intervention in order to improve the following deficits and impairments:  Pain, Impaired UE functional use, Impaired flexibility, Increased fascial restricitons, Decreased strength, Decreased knowledge of use of DME, Decreased range of motion, Decreased scar mobility, Increased edema, Impaired perceived functional ability, Decreased skin integrity, Postural dysfunction  Visit Diagnosis: Aftercare following surgery for neoplasm  Muscle weakness (generalized)  Acute pain of left shoulder  Stiffness of left shoulder, not elsewhere classified     Problem List Patient Active Problem List   Diagnosis Date Noted  . Numbness 02/04/2016  . Cognitive changes 02/04/2016  . Other fatigue 02/04/2016   Bayard Hugger. Manson Passey PT  Donnetta Hail 04/14/2019, 4:40 PM  [facility] [place], [place], [address] Phone: [phone]   Fax:  [phone]  Name: Sheena Jarvis MRN: [id number] Date of Birth: 07/06/1970

## 2019-04-18 ENCOUNTER — Other Ambulatory Visit: Payer: Self-pay

## 2019-04-18 ENCOUNTER — Ambulatory Visit: Payer: Federal, State, Local not specified - PPO

## 2019-04-18 DIAGNOSIS — M25612 Stiffness of left shoulder, not elsewhere classified: Secondary | ICD-10-CM

## 2019-04-18 DIAGNOSIS — M6281 Muscle weakness (generalized): Secondary | ICD-10-CM

## 2019-04-18 DIAGNOSIS — Z483 Aftercare following surgery for neoplasm: Secondary | ICD-10-CM | POA: Diagnosis not present

## 2019-04-18 DIAGNOSIS — M25512 Pain in left shoulder: Secondary | ICD-10-CM | POA: Diagnosis not present

## 2019-04-18 NOTE — Therapy (Signed)
[place], [place], [address] Phone: [phone]   Fax:  [phone]  Physical Therapy Treatment  Patient Details  Name: Sheena Jarvis MRN: [id number] Date of Birth: [birth year] Referring Provider (PT): Dr.Byerly    Encounter Date: 04/18/2019  PT End of Session - 04/18/19 1105    Visit Number  14    Number of Visits  37    Date for PT Re-Evaluation  06/15/19    PT Start Time  1105    PT Stop Time  1200    PT Time Calculation (min)  55 min    Activity Tolerance  Patient tolerated treatment well    Behavior During Therapy  [facility] for tasks assessed/performed       Past Medical History:  Diagnosis Date  . Pneumonia   . Vision abnormalities     Past Surgical History:  Procedure Laterality Date  . APPENDECTOMY    . BREAST CYST ASPIRATION Left   . HIP ARTHROSCOPY    . MASS EXCISION Left 02/10/2019   Procedure: EXCISION OF LEFT BREAST MASS;  Surgeon: Stark Klein, MD;  Location: [facility];  Service: General;  Laterality: Left;    There were no vitals filed for this visit.  Subjective Assessment - 04/18/19 1106    Subjective  Pt reports that she tried to not wear her binder over the weekend and noticed that she was swelling back up with  more pain in her L duct to nipple from the axilla. Pt reports that she is performing scapular stabilization activities at home. She is going to add high repetition low load activities for scapular stabilization at home using bands. She states that she has about 15 other activities that she found that seem to not aggravate her cording.    Pertinent History  Pt has removed of benign breast mass on 14/01/7025 with complication of seroma and clogged drain. She is fearful it may be getting infected and has just started an antibiotic, but doesn't know if she will be able to tolerate it. She has been wearing her post op binder with added compression. Her drain  site has just stopped draining.  She can feel sharp pain from her drain site to her nipple when the seroma starts to fill up She is concerned pt tends to scar down    Patient Stated Goals  To get her arm back to normal    Currently in Pain?  No/denies    Pain Score  0-No pain                       [facility] Adult PT Treatment/Exercise - 04/18/19 0001      Manual Therapy   Manual Therapy  Soft tissue mobilization;Myofascial release;Manual Lymphatic Drainage (MLD);Edema management    Edema Management  Pt was encouraged to wear her nubbed foam as tolerated and compression bra. Discussed the importance of performing MLD following any type of dry needling that she might want to try on that side due to pt states that she has a friend that does dry needling and wants to try if on her L side for scarring. She was educated on the risks associated with needles.     Soft tissue mobilization  STM to the fibrotic tissue in the L axilla at the junction of the axilla and trunk near axillary incision and in the L breast on the lateral side where pt lumpectomy was performed with significant  softening of tissue following STM.     Myofascial Release  longitudinal and cross friction myofascial release with crossed hands along the L lateral trunk from axilla to the ASIS, L axilla to the L breast and in the L axilla.     Manual Lymphatic Drainage (MLD)  In supine: short neck, swimming in the terminus, 5 diaphragmatic breathes, Bil axillary and inguinal nodes, L axillo-inguinal anastomosis, medial to lateral L brachium, lateral L brachium, all surfaces of the antebrachium, dorsum of the hand, reworked all surfaces then deep abomdinals following STM/myofascial release in order to decrease risk of fluid build up.              PT Education - 04/18/19 1306    Education Details  Pt will add low load, high repetition scapular stabilization to her exercise routine. Along with the other things she has been  doing at home.    Person(s) Educated  Patient    Methods  Explanation    Comprehension  Verbalized understanding          PT Long Term Goals - 04/13/19 16100923      PT LONG TERM GOAL #1   Title  Pt will be independent in sel MLD and use of compression to manage fullness in left upper quadrant    Time  4    Period  Weeks    Status  Achieved      PT LONG TERM GOAL #2   Title  Pt will have 150 degrees of left shoulder flexion and abduction so that she can return to normal activities    Baseline  on eval, 130 degrees flexion and 90 degrees of abduction . 04/13/2019: 140 degrees flexion and 125 degrees of abduction    Time  8    Period  Weeks    Status  On-going      PT LONG TERM GOAL #3   Title  Pt will report she is able to do her job that involves manual work with both arms with no increase in pain or cording    Time  8    Period  Weeks    Status  New      PT LONG TERM GOAL #4   Title  Pt will have decrease in visual and palpable axillary cording by 50%  per photo and verbal report    Time  8    Period  Weeks    Status  New            Plan - 04/18/19 1105    Clinical Impression Statement  Pt ROM has improved since her initial evaluation. She continues with cording in her L axillary area with fibrotic tissue changes in the lateral to medial L breast ending at the L nipple area and in the L axilla near the junction at the trunk. Tightness noted throughout the myofascial from the medial L brachium down to the L ASIS. Decreased fibrosis and reported tightness following myofascial release and STM to the fibrotic tissue. MLD was performed for the LUE and anterior trunk following myofascial release and STM in order to decrease risk for fluid build up. L shoulder ROM assess today with scapular block and pt presents with good pure Glenohumeral joint movement; discussed working on staiblizing the L scapula in order to decrease impingment including low load/hihg repetitions activities and  avoiding WB at this time due to it seems to increase pain. Pt will benefit from continued POC at this time.  Personal Factors and Comorbidities  Comorbidity 2    Comorbidities  breast surgery with seroma. previous history of increased scarring after other surgeries    Examination-Activity Limitations  Reach Overhead    Rehab Potential  Good    PT Frequency  3x / week    PT Duration  8 weeks    PT Treatment/Interventions  ADLs/Self Care Home Management;Therapeutic exercise;Orthotic Fit/Training;Patient/family education;Manual techniques;Manual lymph drainage;Passive range of motion;Scar mobilization;Taping;Functional mobility training;DME Instruction;Therapeutic activities;Neuromuscular re-education;Dry needling    PT Next Visit Plan  Continue MLD, joint mobilizaion and myofascial release  with release and soft tissue work to left shoulder as tolerated. use moist heat to cording as needed monitoring effectiveness. Manual work to cording  Increase ROM and progress strengthening starting with isometrics monitoring scapular control   Consider dry needling.    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    Consulted and Agree with Plan of Care  Patient       Patient will benefit from skilled therapeutic intervention in order to improve the following deficits and impairments:  Pain, Impaired UE functional use, Impaired flexibility, Increased fascial restricitons, Decreased strength, Decreased knowledge of use of DME, Decreased range of motion, Decreased scar mobility, Increased edema, Impaired perceived functional ability, Decreased skin integrity, Postural dysfunction  Visit Diagnosis: Aftercare following surgery for neoplasm  Muscle weakness (generalized)  Acute pain of left shoulder  Stiffness of left shoulder, not elsewhere classified     Problem List Patient Active Problem List   Diagnosis Date Noted  . Numbness  02/04/2016  . Cognitive changes 02/04/2016  . Other fatigue 02/04/2016    Claudia Desanctis, PT 04/18/2019, 1:24 PM  [facility] [place], [place], [address] Phone: [phone]   Fax:  [phone]  Name: Sheena Jarvis MRN: [id number] Date of Birth: 1970-12-21

## 2019-04-20 ENCOUNTER — Other Ambulatory Visit: Payer: Self-pay

## 2019-04-20 ENCOUNTER — Encounter: Payer: Self-pay | Admitting: Physical Therapy

## 2019-04-20 ENCOUNTER — Ambulatory Visit: Payer: Federal, State, Local not specified - PPO | Admitting: Physical Therapy

## 2019-04-20 DIAGNOSIS — M25612 Stiffness of left shoulder, not elsewhere classified: Secondary | ICD-10-CM

## 2019-04-20 DIAGNOSIS — M6281 Muscle weakness (generalized): Secondary | ICD-10-CM | POA: Diagnosis not present

## 2019-04-20 DIAGNOSIS — M25512 Pain in left shoulder: Secondary | ICD-10-CM | POA: Diagnosis not present

## 2019-04-20 DIAGNOSIS — Z483 Aftercare following surgery for neoplasm: Secondary | ICD-10-CM | POA: Diagnosis not present

## 2019-04-20 NOTE — Therapy (Signed)
[facility] [facility] [address] [place], [place], [address] Phone: [phone]   Fax:  [phone]  Physical Therapy Treatment  Patient Details  Name: Sheena Jarvis MRN: [id number] Date of Birth: 1971/03/10 Referring Provider (PT): Dr.Byerly    Encounter Date: 04/20/2019  PT End of Session - 04/20/19 1211    Visit Number  15    Number of Visits  37    Date for PT Re-Evaluation  06/15/19    PT Start Time  0900    PT Stop Time  0950    PT Time Calculation (min)  50 min    Activity Tolerance  Patient tolerated treatment well    Behavior During Therapy  [facility] for tasks assessed/performed       Past Medical History:  Diagnosis Date  . Pneumonia   . Vision abnormalities     Past Surgical History:  Procedure Laterality Date  . APPENDECTOMY    . BREAST CYST ASPIRATION Left   . HIP ARTHROSCOPY    . MASS EXCISION Left 02/10/2019   Procedure: EXCISION OF LEFT BREAST MASS;  Surgeon: Almond Lint, MD;  Location: [facility];  Service: General;  Laterality: Left;    There were no vitals filed for this visit.  Subjective Assessment - 04/20/19 0903    Subjective  Pt says that she had needling yesterday with some estim around the scar yesterday.  She felt had she had more active range of motion active and felt that the tissue is better. She feels that she will do it again    Pertinent History  Pt has removed of benign breast mass on 02/10/2019 with complication of seroma and clogged drain. She is fearful it may be getting infected and has just started an antibiotic, but doesn't know if she will be able to tolerate it. She has been wearing her post op binder with added compression. Her drain site has just stopped draining.  She can feel sharp pain from her drain site to her nipple when the seroma starts to fill up She is concerned pt tends to scar down    Patient Stated Goals  To get her arm back to normal    Currently in Pain?   No/denies                       [facility] Adult PT Treatment/Exercise - 04/20/19 0001      Manual Therapy   Manual Therapy  Soft tissue mobilization;Myofascial release;Manual Lymphatic Drainage (MLD);Edema management    Edema Management  encouraged pt to swith to small dotted foam in compression bra     Soft tissue mobilization  able to "pinch and pick up"  thick cording tissue that allowed more aggressve tissue bending and soft tissue mobilization to this area today prolonged pressure at subscapularis muscle     Myofascial Release  with crossed hands and adjusting tension to response of tissues myofascial release to congestion in left breast and axilla with extra work around 2 smaller scars  worked in supine and sidelying to apply tension to tissues in various directions.  Today was able to get directly around smaller scars to apply tissue bending  pt had good release especially in sidelying.  Tried to pick up scars and was able to pick  up larger scar about 75% but 2 smaller scars are still tethered     Manual Lymphatic Drainage (MLD)  at beginning and end treatment, shoulder circles, left axillary  nodes.                   PT Long Term Goals - 04/13/19 16100923      PT LONG TERM GOAL #1   Title  Pt will be independent in sel MLD and use of compression to manage fullness in left upper quadrant    Time  4    Period  Weeks    Status  Achieved      PT LONG TERM GOAL #2   Title  Pt will have 150 degrees of left shoulder flexion and abduction so that she can return to normal activities    Baseline  on eval, 130 degrees flexion and 90 degrees of abduction . 04/13/2019: 140 degrees flexion and 125 degrees of abduction    Time  8    Period  Weeks    Status  On-going      PT LONG TERM GOAL #3   Title  Pt will report she is able to do her job that involves manual work with both arms with no increase in pain or cording    Time  8    Period  Weeks    Status  New      PT  LONG TERM GOAL #4   Title  Pt will have decrease in visual and palpable axillary cording by 50%  per photo and verbal report    Time  8    Period  Weeks    Status  New            Plan - 04/20/19 1212    Clinical Impression Statement  Pt is showing good improvment today with softening and decongestion of tissue in between scars and less prominence of tissue cording.  The skin appears softer overall. Pt is pleased with progress and wants to continue with dry needling. Will work to schedule her with dry needling certified therapists at [facility] who will take over her care With this improvmen, she should be ready to decrease to 2 times a week    Personal Factors and Comorbidities  Comorbidity 2    Comorbidities  breast surgery with seroma. previous history of increased scarring after other surgeries    Examination-Activity Limitations  Reach Overhead    Stability/Clinical Decision Making  Stable/Uncomplicated    Rehab Potential  Good    PT Frequency  3x / week   soon ready to decrease to 2x a week   PT Duration  8 weeks    PT Treatment/Interventions  ADLs/Self Care Home Management;Therapeutic exercise;Orthotic Fit/Training;Patient/family education;Manual techniques;Manual lymph drainage;Passive range of motion;Scar mobilization;Taping;Functional mobility training;DME Instruction;Therapeutic activities;Neuromuscular re-education;Dry needling    PT Next Visit Plan  discuss transfer to [facility] and decrease to 2 times a week with pt Continue MLD, joint mobilizaion and myofascial release  with release and soft tissue work to left shoulder as tolerated. use moist heat to cording as needed monitoring effectiveness. 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       Patient will benefit from skilled  therapeutic intervention in order to improve the following deficits and impairments:  Pain, Impaired UE functional use, Impaired flexibility, Increased fascial restricitons, Decreased strength, Decreased knowledge of use of DME, Decreased range of motion, Decreased scar mobility, Increased edema, Impaired perceived functional ability, Decreased  skin integrity, Postural dysfunction  Visit Diagnosis: Aftercare following surgery for neoplasm  Muscle weakness (generalized)  Acute pain of left shoulder  Stiffness of left shoulder, not elsewhere classified     Problem List Patient Active Problem List   Diagnosis Date Noted  . Numbness 02/04/2016  . Cognitive changes 02/04/2016  . Other fatigue 02/04/2016   Donato Heinz. Owens Shark PT  Norwood Levo 04/20/2019, 12:16 PM  [place], [place], [address] Phone: [phone]   Fax:  [phone]  Name: Sheena Jarvis MRN: [id number] Date of Birth: 09-05-1970

## 2019-04-22 ENCOUNTER — Ambulatory Visit: Payer: Federal, State, Local not specified - PPO

## 2019-04-22 ENCOUNTER — Other Ambulatory Visit: Payer: Self-pay

## 2019-04-22 DIAGNOSIS — M6281 Muscle weakness (generalized): Secondary | ICD-10-CM

## 2019-04-22 DIAGNOSIS — M25612 Stiffness of left shoulder, not elsewhere classified: Secondary | ICD-10-CM

## 2019-04-22 DIAGNOSIS — M25512 Pain in left shoulder: Secondary | ICD-10-CM

## 2019-04-22 DIAGNOSIS — Z483 Aftercare following surgery for neoplasm: Secondary | ICD-10-CM | POA: Diagnosis not present

## 2019-04-22 NOTE — Therapy (Signed)
[facility] [address] [place], [place], [address] Phone: [phone]   Fax:  [phone]  Physical Therapy Treatment  Patient Details  Name: Sheena Jarvis MRN: [id number] Date of Birth: 02/01/1971 Referring Provider (PT): Dr.Byerly    Encounter Date: 04/22/2019  PT End of Session - 04/22/19 1231    Visit Number  16    Number of Visits  37    Date for PT Re-Evaluation  06/15/19    PT Start Time  0905    PT Stop Time  1000    PT Time Calculation (min)  55 min    Activity Tolerance  Patient tolerated treatment well    Behavior During Therapy  [facility] for tasks assessed/performed       Past Medical History:  Diagnosis Date  . Pneumonia   . Vision abnormalities     Past Surgical History:  Procedure Laterality Date  . APPENDECTOMY    . BREAST CYST ASPIRATION Left   . HIP ARTHROSCOPY    . MASS EXCISION Left 02/10/2019   Procedure: EXCISION OF LEFT BREAST MASS;  Surgeon: Almond LintByerly, Faera, MD;  Location: [facility];  Service: General;  Laterality: Left;    There were no vitals filed for this visit.  Subjective Assessment - 04/22/19 1052    Subjective  Pt reports that she is still a little sore from needling but feels that she has loosed up a lot. She is going to move her care to the [facility] due to she wants to get more dry needling done and that is not offered at cancer rehab at this time.    Pertinent History  Pt has removed of benign breast mass on 02/10/2019 with complication of seroma and clogged drain. She is fearful it may be getting infected and has just started an antibiotic, but doesn't know if she will be able to tolerate it. She has been wearing her post op binder with added compression. Her drain site has just stopped draining.  She can feel sharp pain from her drain site to her nipple when the seroma starts to fill up She is concerned pt tends to scar down    Patient Stated Goals  To  get her arm back to normal    Currently in Pain?  No/denies    Pain Score  0-No pain                       [facility] Adult PT Treatment/Exercise - 04/22/19 0001      Manual Therapy   Manual Therapy  Soft tissue mobilization;Myofascial release;Manual Lymphatic Drainage (MLD)    Soft tissue mobilization  soft tissue mobilitzation in the L breast due to palpable fibrotic tissue most likely related to biopsy in the medial/superior L breast and lumpectomy in the lateral breast; decresaed moderately following STM.     Myofascial Release  Longtitudinal and cross friction myofascial release from the L breast to the L axilla and over the medial L breast near the sternum due to adhesions noted; decreased minimally following myofascial release.     Manual Lymphatic Drainage (MLD)  After STm and myofascial release in supine: 5 diaphragmatic breathes, short neck, swimming in the terminus, Bil axillary/inguinal nodes, L axillo-inguinal anastomosis, anterior inter-axillary anastomosis, superior breast, anterior inter-axillary anastomosis, inferior breast, L axillo-inguinal anastomosis, reworked all surfaces and deep abdominals.              PT Education - 04/22/19 1230  Education Details  Pt will continue with fibrotic soft tissue mobilization at home going gently in the L breast. She is going to finish her care at [facility] patient [facility] for dry needling.    Person(s) Educated  Patient    Methods  Explanation    Comprehension  Verbalized understanding          PT Long Term Goals - 04/13/19 1610      PT LONG TERM GOAL #1   Title  Pt will be independent in sel MLD and use of compression to manage fullness in left upper quadrant    Time  4    Period  Weeks    Status  Achieved      PT LONG TERM GOAL #2   Title  Pt will have 150 degrees of left shoulder flexion and abduction so that she can return to normal activities    Baseline  on eval, 130 degrees flexion and 90 degrees  of abduction . 04/13/2019: 140 degrees flexion and 125 degrees of abduction    Time  8    Period  Weeks    Status  On-going      PT LONG TERM GOAL #3   Title  Pt will report she is able to do her job that involves manual work with both arms with no increase in pain or cording    Time  8    Period  Weeks    Status  New      PT LONG TERM GOAL #4   Title  Pt will have decrease in visual and palpable axillary cording by 50%  per photo and verbal report    Time  8    Period  Weeks    Status  New            Plan - 04/22/19 0906    Clinical Impression Statement  Palpable fibrotic tissue noted today in the medial superior portion of the L breast; pt states she had a biopsy in this area, and fibrotic tissue continues in the lateral breast. STM was performed with moderate softening of fibrotic tissue with extra time spent in these areas going gentle and increasing firmness; pain-free. Myofascial tightness was also noted at the medial breast near the sternum and cording continues; myofascial release performed longitudinally and cross friction with minimal release. Pt will begin going to out patient physical therapy at the [facility] for dry needling. MLD was performed following STM in order to decrease risk for fluid build up and promote fluid flow in the L breast. Pt will benefit from continued 2x/week POC at this time.    Personal Factors and Comorbidities  Comorbidity 2    Comorbidities  breast surgery with seroma. previous history of increased scarring after other surgeries    Examination-Activity Limitations  Reach Overhead    PT Frequency  3x / week    PT Duration  8 weeks    PT Treatment/Interventions  ADLs/Self Care Home Management;Therapeutic exercise;Orthotic Fit/Training;Patient/family education;Manual techniques;Manual lymph drainage;Passive range of motion;Scar mobilization;Taping;Functional mobility training;DME Instruction;Therapeutic activities;Neuromuscular  re-education;Dry needling    PT Next Visit Plan  Pt will start 2x/week for dry needling at [facility].    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    Consulted and Agree with Plan of Care  Patient       Patient will benefit from skilled therapeutic intervention in order  to improve the following deficits and impairments:  Pain, Impaired UE functional use, Impaired flexibility, Increased fascial restricitons, Decreased strength, Decreased knowledge of use of DME, Decreased range of motion, Decreased scar mobility, Increased edema, Impaired perceived functional ability, Decreased skin integrity, Postural dysfunction  Visit Diagnosis: Aftercare following surgery for neoplasm  Muscle weakness (generalized)  Acute pain of left shoulder  Stiffness of left shoulder, not elsewhere classified     Problem List Patient Active Problem List   Diagnosis Date Noted  . Numbness 02/04/2016  . Cognitive changes 02/04/2016  . Other fatigue 02/04/2016    Sheena Jarvis, PT 04/22/2019, 12:35 PM  [facility] [address] [place], [place], [address] Phone: [phone]   Fax:  [phone]  Name: CHANETTE DEMO MRN: [id number] Date of Birth: 02/15/1971

## 2019-04-26 ENCOUNTER — Encounter: Payer: Self-pay | Admitting: Physical Therapy

## 2019-04-26 ENCOUNTER — Other Ambulatory Visit: Payer: Self-pay

## 2019-04-26 ENCOUNTER — Ambulatory Visit: Payer: Federal, State, Local not specified - PPO | Admitting: Physical Therapy

## 2019-04-26 DIAGNOSIS — Z23 Encounter for immunization: Secondary | ICD-10-CM | POA: Diagnosis not present

## 2019-04-26 DIAGNOSIS — Z483 Aftercare following surgery for neoplasm: Secondary | ICD-10-CM | POA: Diagnosis not present

## 2019-04-26 DIAGNOSIS — M25512 Pain in left shoulder: Secondary | ICD-10-CM | POA: Diagnosis not present

## 2019-04-26 DIAGNOSIS — M25612 Stiffness of left shoulder, not elsewhere classified: Secondary | ICD-10-CM | POA: Diagnosis not present

## 2019-04-26 DIAGNOSIS — M6281 Muscle weakness (generalized): Secondary | ICD-10-CM | POA: Diagnosis not present

## 2019-04-26 NOTE — Therapy (Signed)
[facility] [address], [place], [place], [address] Phone: [phone]   Fax:  [phone]  Physical Therapy Treatment  Patient Details  Name: Sheena Jarvis MRN: [id number] Date of Birth: Mar 07, 1971 Referring Provider (PT): Dr.Byerly    Encounter Date: 04/26/2019  PT End of Session - 04/26/19 0809    Visit Number  17    Number of Visits  37    Date for PT Re-Evaluation  06/15/19    PT Start Time  0800    PT Stop Time  0850    PT Time Calculation (min)  50 min    Activity Tolerance  Patient tolerated treatment well       Past Medical History:  Diagnosis Date  . Pneumonia   . Vision abnormalities     Past Surgical History:  Procedure Laterality Date  . APPENDECTOMY    . BREAST CYST ASPIRATION Left   . HIP ARTHROSCOPY    . MASS EXCISION Left 02/10/2019   Procedure: EXCISION OF LEFT BREAST MASS;  Surgeon: Stark Klein, MD;  Location: [facility];  Service: General;  Laterality: Left;    There were no vitals filed for this visit.  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 She is fearful it may be getting infected and has just started an antibiotic, but doesn't know if she will be able to tolerate it. She has been wearing her post op binder with added compression. Her drain site has just stopped draining.  She can feel sharp pain from her drain site to her nipple when the seroma starts to fill up She is concerned pt tends to scar down    Patient Stated Goals  To get her arm back to normal    Currently in Pain?  No/denies                       [facility] Adult PT Treatment/Exercise - 04/26/19 0001      Manual Therapy   Manual  Therapy  Soft tissue mobilization;Myofascial release    Soft tissue mobilization  soft tissue mobilitzation in the L breast due to palpable fibrotic tissue most likely related to biopsy in the medial/superior L breast and lumpectomy in the lateral breast; decresaed moderately following STM; STM to Lt latissimus and thoracic parapsinals       Trigger Point Dry Needling - 04/26/19 0001    Consent Given?  Yes    Education Handout Provided  Previously provided    Muscles Treated Upper Quadrant  Latissimus dorsi    Other Dry Needling  fiberous cords below incisions , thoracic paraspinals    Latissimus dorsi Response  Palpable increased muscle length;Twitch response elicited                PT Long Term Goals - 04/13/19 0923      PT LONG TERM GOAL #1   Title  Pt will be independent in sel MLD and use of compression to manage fullness in left upper quadrant    Time  4    Period  Weeks    Status  Achieved      PT LONG TERM GOAL #2  Title  Pt will have 150 degrees of left shoulder flexion and abduction so that she can return to normal activities    Baseline  on eval, 130 degrees flexion and 90 degrees of abduction . 04/13/2019: 140 degrees flexion and 125 degrees of abduction    Time  8    Period  Weeks    Status  On-going      PT LONG TERM GOAL #3   Title  Pt will report she is able to do her job that involves manual work with both arms with no increase in pain or cording    Time  8    Period  Weeks    Status  New      PT LONG TERM GOAL #4   Title  Pt will have decrease in visual and palpable axillary cording by 50%  per photo and verbal report    Time  8    Period  Weeks    Status  New            Plan - 04/26/19 0810    Clinical Impression Statement  Pt continues to have very thick fibrotic tissue.  Tissue released moderately with dry needling and manual techniques. Pt had increased shoulder abduction from 105 degrees prior to treatment to 112 after treatment today.   Pt will benefit from aggressive manual and dry needling approach to make some headway in scar tissue that is limited muscle capability and ROM. Pt is scheduled for second visit this week and will benefit from further dry needling and STM    Comorbidities  breast surgery with seroma. 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    Consulted and Agree with Plan of Care  Patient       Patient will benefit from skilled therapeutic intervention in order to improve the following deficits and impairments:  Pain, Impaired UE functional use, Impaired flexibility, Increased fascial restricitons, Decreased strength, Decreased knowledge of use of DME, Decreased range of motion, Decreased scar mobility, Increased edema, Impaired perceived functional ability, Decreased skin integrity, Postural dysfunction  Visit Diagnosis: Aftercare following surgery for neoplasm  Muscle weakness (generalized)  Acute pain of left shoulder  Stiffness of left shoulder, not elsewhere classified     Problem List Patient Active Problem List   Diagnosis Date Noted  . Numbness 02/04/2016  . Cognitive changes 02/04/2016  . Other fatigue 02/04/2016    Junious Silk, PT 04/26/2019, 1:32 PM  [facility] [address], [address] [place], [place], [address] Phone:  [phone]   Fax:  [phone]  Name: TEMPEST FRANKLAND MRN: [id number] Date of Birth: August 10, 1970

## 2019-04-28 ENCOUNTER — Ambulatory Visit: Payer: Federal, State, Local not specified - PPO | Admitting: Physical Therapy

## 2019-04-28 ENCOUNTER — Encounter: Payer: Self-pay | Admitting: Physical Therapy

## 2019-04-28 ENCOUNTER — Other Ambulatory Visit: Payer: Self-pay

## 2019-04-28 DIAGNOSIS — Z483 Aftercare following surgery for neoplasm: Secondary | ICD-10-CM

## 2019-04-28 DIAGNOSIS — M6281 Muscle weakness (generalized): Secondary | ICD-10-CM | POA: Diagnosis not present

## 2019-04-28 DIAGNOSIS — M25612 Stiffness of left shoulder, not elsewhere classified: Secondary | ICD-10-CM

## 2019-04-28 DIAGNOSIS — M25512 Pain in left shoulder: Secondary | ICD-10-CM

## 2019-04-28 NOTE — Therapy (Signed)
[facility] [address], [place], [place], [address] Phone: [phone]   Fax:  [phone]  Physical Therapy Treatment  Patient Details  Name: Sheena Jarvis MRN: [id number] Date of Birth: 09/02/1970 Referring Provider (PT): Dr.Byerly    Encounter Date: 04/28/2019  PT End of Session - 04/28/19 1159    Visit Number  18    Number of Visits  37    Date for PT Re-Evaluation  06/15/19    PT Start Time  1100    PT Stop Time  1150    PT Time Calculation (min)  50 min    Activity Tolerance  Patient tolerated treatment well    Behavior During Therapy  [facility] for tasks assessed/performed       Past Medical History:  Diagnosis Date  . Pneumonia   . Vision abnormalities     Past Surgical History:  Procedure Laterality Date  . APPENDECTOMY    . BREAST CYST ASPIRATION Left   . HIP ARTHROSCOPY    . MASS EXCISION Left 02/10/2019   Procedure: EXCISION OF LEFT BREAST MASS;  Surgeon: Stark Klein, MD;  Location: [facility];  Service: General;  Laterality: Left;    There were no vitals filed for this visit.  Subjective Assessment - 04/28/19 1102    Subjective  Pt states she has duct pain without the compression.  Did dry needling to the cord yesterday and got improved abduction    Patient Stated Goals  To get her arm back to normal    Currently in Pain?  No/denies         [facility] PT Assessment - 04/28/19 0001      Observation/Other Assessments   Observations  width of cord post treatment - blelow incision 2cm; at proximal dimpling (above incision) 2.4cm      AROM   Left Shoulder ABduction  119 Degrees   improved to 129 post treatment                  [facility] Adult PT Treatment/Exercise - 04/28/19 0001      Manual Therapy   Joint Mobilization  to [facility] joint a/p and traction    Soft tissue mobilization  soft tissue mobilitzation in the L breast due to palpable fibrotic tissue most likely  related to biopsy in the medial/superior L breast and lumpectomy in the lateral breast; decresaed moderately following STM; STM to Lt latissimus and thoracic parapsinals, tensile stretching to cord in all direction and with arm in various positions to diminish cord girth and assist in lengthening for greater ROM       Trigger Point Dry Needling - 04/28/19 0001    Consent Given?  Yes    Muscles Treated Upper Quadrant  Pectoralis minor;Deltoid    Pectoralis Minor Response  Twitch response elicited;Palpable increased muscle length    Deltoid Response  Twitch response elicited;Palpable increased muscle length    Latissimus dorsi Response  Palpable increased muscle length;Twitch response elicited                PT Long Term Goals - 04/13/19 0923      PT LONG TERM GOAL #1   Title  Pt will be independent in sel MLD and use of compression to manage fullness in left upper quadrant    Time  4    Period  Weeks    Status  Achieved      PT LONG TERM GOAL #2  Title  Pt will have 150 degrees of left shoulder flexion and abduction so that she can return to normal activities    Baseline  on eval, 130 degrees flexion and 90 degrees of abduction . 04/13/2019: 140 degrees flexion and 125 degrees of abduction    Time  8    Period  Weeks    Status  On-going      PT LONG TERM GOAL #3   Title  Pt will report she is able to do her job that involves manual work with both arms with no increase in pain or cording    Time  8    Period  Weeks    Status  New      PT LONG TERM GOAL #4   Title  Pt will have decrease in visual and palpable axillary cording by 50%  per photo and verbal report    Time  8    Period  Weeks    Status  New            Plan - 04/28/19 1159    Clinical Impression Statement  Pt started with 119 degrees of abduction today.  Pt responded well to manual treatment.  Pt is unable to stretch and manipulate the cord with the intensity needed to do anything effective on her own  at home, so treatment focuses on STM.  Pt also has less protraction of her Lt shoulder after soft tissue mobs and needling to pecs, anterior deltoid and lats.  Pt will continue to benefit from skilled PT fo rimproved ROM.  She had 129 deg of abduction after today's treatment.       Patient will benefit from skilled therapeutic intervention in order to improve the following deficits and impairments:     Visit Diagnosis: Aftercare following surgery for neoplasm  Muscle weakness (generalized)  Acute pain of left shoulder  Stiffness of left shoulder, not elsewhere classified     Problem List Patient Active Problem List   Diagnosis Date Noted  . Numbness 02/04/2016  . Cognitive changes 02/04/2016  . Other fatigue 02/04/2016    Junious Silk, PT 04/28/2019, 12:12 PM  [facility] [address], [address] [place], [place], [address] Phone: [phone]   Fax:  [phone]  Name: Sheena Jarvis MRN: [id number] Date of Birth: 06/12/1970

## 2019-05-02 ENCOUNTER — Ambulatory Visit: Payer: Federal, State, Local not specified - PPO | Admitting: Physical Therapy

## 2019-05-02 ENCOUNTER — Encounter: Payer: Self-pay | Admitting: Physical Therapy

## 2019-05-02 ENCOUNTER — Other Ambulatory Visit: Payer: Self-pay

## 2019-05-02 DIAGNOSIS — M6281 Muscle weakness (generalized): Secondary | ICD-10-CM

## 2019-05-02 DIAGNOSIS — M25612 Stiffness of left shoulder, not elsewhere classified: Secondary | ICD-10-CM | POA: Diagnosis not present

## 2019-05-02 DIAGNOSIS — M25512 Pain in left shoulder: Secondary | ICD-10-CM

## 2019-05-02 DIAGNOSIS — Z483 Aftercare following surgery for neoplasm: Secondary | ICD-10-CM | POA: Diagnosis not present

## 2019-05-02 NOTE — Therapy (Signed)
[facility] [address] [place], [place], [address] Phone: [phone]   Fax:  [phone]  Physical Therapy Treatment  Patient Details  Name: Sheena Jarvis MRN: [id number] Date of Birth: January 23, 1971 Referring Provider (PT): Dr.Byerly    Encounter Date: 05/02/2019  PT End of Session - 05/02/19 0802    Visit Number  19    Number of Visits  37    Date for PT Re-Evaluation  06/15/19    PT Start Time  0801    PT Stop Time  7425    PT Time Calculation (min)  46 min    Activity Tolerance  Patient tolerated treatment well    Behavior During Therapy  [facility] for tasks assessed/performed       Past Medical History:  Diagnosis Date  . Pneumonia   . Vision abnormalities     Past Surgical History:  Procedure Laterality Date  . APPENDECTOMY    . BREAST CYST ASPIRATION Left   . HIP ARTHROSCOPY    . MASS EXCISION Left 02/10/2019   Procedure: EXCISION OF LEFT BREAST MASS;  Surgeon: Stark Klein, MD;  Location: [facility];  Service: General;  Laterality: Left;    There were no vitals filed for this visit.  Subjective Assessment - 05/02/19 0853    Subjective  Pt states she did some stuff on a ladder this weekend and feels like her lats are very tight today. Pain still at end range.  She reports she cannot use the left UE to place things in the top shelt    Pertinent History  Pt has removed of benign breast mass on 95/10/3873 with complication of seroma and clogged drain. She is fearful it may be getting infected and has just started an antibiotic, but doesn't know if she will be able to tolerate it. She has been wearing her post op binder with added compression. Her drain site has just stopped draining.  She can feel sharp pain from her drain site to her nipple when the seroma starts to fill up She is concerned pt tends to scar down    Patient Stated Goals  To get her arm back to normal    Currently in Pain?   No/denies                       [facility] Adult PT Treatment/Exercise - 05/02/19 0001      Manual Therapy   Soft tissue mobilization  soft tissue mobilitzation in the L breast due to palpable fibrotic tissue most likely related to biopsy in the medial/superior L breast and lumpectomy in the lateral breast; decresaed moderately following STM; STM to Lt latissimus and thoracic parapsinals, tensile stretching to cord in all direction and with arm in various positions to diminish cord girth and assist in lengthening for greater ROM    Myofascial Release  tractioning lats and cord tissue in all directions       Trigger Point Dry Needling - 05/02/19 0001    Consent Given?  Yes    Education Handout Provided  Previously provided    Other Dry Needling  fiberous cords at and below dimpling above inciscion down to just below incision - shelf technique    Latissimus dorsi Response  Twitch response elicited;Palpable increased muscle length                PT Long Term Goals - 05/02/19 0855      PT  LONG TERM GOAL #2   Title  Pt will have 150 degrees of left shoulder flexion and abduction so that she can return to normal activities    Baseline  137 after treatment    Status  On-going      PT LONG TERM GOAL #3   Title  Pt will report she is able to do her job that involves manual work with both arms with no increase in pain or cording    Baseline  cording increases with close chain exercises      PT LONG TERM GOAL #4   Title  Pt will have decrease in visual and palpable axillary cording by 50%  per photo and verbal report    Status  On-going            Plan - 05/02/19 0848    Clinical Impression Statement  Pt started session with 122 deg of abduction and after treatment 137 deg.  She continues to make progress and is not regressing as much after previous treatment.  She will continue to benefit from skilled PT to agressively work on stretching and manual techniques to  release adhesive tissue.    Comorbidities  breast surgery with seroma. 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    Consulted and Agree with Plan of Care  Patient       Patient will benefit from skilled therapeutic intervention in order to improve the following deficits and impairments:  Pain, Impaired UE functional use, Impaired flexibility, Increased fascial restricitons, Decreased strength, Decreased knowledge of use of DME, Decreased range of motion, Decreased scar mobility, Increased edema, Impaired perceived functional ability, Decreased skin integrity, Postural dysfunction  Visit Diagnosis: Aftercare following surgery for neoplasm  Muscle weakness (generalized)  Acute pain of left shoulder  Stiffness of left shoulder, not elsewhere classified     Problem List Patient Active Problem List   Diagnosis Date Noted  . Numbness 02/04/2016  . Cognitive changes 02/04/2016  . Other fatigue 02/04/2016    Junious Silk, PT 05/02/2019, 9:10 AM  [facility] [address] [place], [place], [address] Phone: [phone]   Fax:  [phone]  Name: Sheena Jarvis MRN: [id number] Date of Birth: [birth year]

## 2019-05-05 ENCOUNTER — Other Ambulatory Visit: Payer: Self-pay

## 2019-05-05 ENCOUNTER — Encounter

## 2019-05-05 ENCOUNTER — Encounter: Payer: Self-pay | Admitting: Physical Therapy

## 2019-05-05 ENCOUNTER — Ambulatory Visit: Payer: Federal, State, Local not specified - PPO | Admitting: Physical Therapy

## 2019-05-05 DIAGNOSIS — M6281 Muscle weakness (generalized): Secondary | ICD-10-CM | POA: Diagnosis not present

## 2019-05-05 DIAGNOSIS — M25612 Stiffness of left shoulder, not elsewhere classified: Secondary | ICD-10-CM

## 2019-05-05 DIAGNOSIS — Z483 Aftercare following surgery for neoplasm: Secondary | ICD-10-CM

## 2019-05-05 DIAGNOSIS — M25512 Pain in left shoulder: Secondary | ICD-10-CM | POA: Diagnosis not present

## 2019-05-05 NOTE — Therapy (Signed)
[facility] [address] [place], [place], [address] Phone: [phone]   Fax:  [phone]  Physical Therapy Treatment  Patient Details  Name: Sheena Jarvis MRN: [id number] Date of Birth: 20-Oct-1970 Referring Provider (PT): Dr.Byerly    Encounter Date: 05/05/2019  PT End of Session - 05/05/19 1956    Visit Number  20    Number of Visits  37    Date for PT Re-Evaluation  06/15/19    PT Start Time  1147    PT Stop Time  0254    PT Time Calculation (min)  48 min    Activity Tolerance  Patient tolerated treatment well    Behavior During Therapy  [facility] for tasks assessed/performed       Past Medical History:  Diagnosis Date  . Pneumonia   . Vision abnormalities     Past Surgical History:  Procedure Laterality Date  . APPENDECTOMY    . BREAST CYST ASPIRATION Left   . HIP ARTHROSCOPY    . MASS EXCISION Left 02/10/2019   Procedure: EXCISION OF LEFT BREAST MASS;  Surgeon: Stark Klein, MD;  Location: [place];  Service: General;  Laterality: Left;    There were no vitals filed for this visit.  Subjective Assessment - 05/05/19 2004    Subjective  Pt reports she has researched some other methods to release the cord.  One is laser therapy.  Pt is open to trying anything that will help improve ROM so she can get full strength and use of ther arm back.    Patient Stated Goals  To get her arm back to normal    Currently in Pain?  No/denies         [facility] PT Assessment - 05/05/19 0001      AROM   Left Shoulder ABduction  118 Degrees   134 deg post treatment                  [facility] Adult PT Treatment/Exercise - 05/05/19 0001      Manual Therapy   Joint Mobilization  to [facility] joint a/p and traction    Soft tissue mobilization  soft tissue mobilitzation in the L breast due to palpable fibrotic tissue most likely related to biopsy in the medial/superior L breast and lumpectomy in the  lateral breast; decresaed moderately following STM; STM to Lt latissimus and thoracic parapsinals, tensile stretching to cord in all direction and with arm in various positions to diminish cord girth and assist in lengthening for greater ROM; cupping along cord; IASTM along cord       Trigger Point Dry Needling - 05/05/19 0001    Consent Given?  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  Pt had increased circulation with cupping and IASTM techniques used today.  Needling applied down the cord tissue from below the incision to mid-brachial region and stim was applied in one min intervals with needles in place.  There was some palpable increase in cord pliability and increased ROM as mentioned.  Pt will continue to benefit from aggressive manual techniques to release adhesive tissue so she can return to full functional level at home and work.    Comorbidities  breast surgery with seroma. 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    Consulted and Agree with Plan of Care  Patient       Patient will benefit from skilled therapeutic intervention in order to improve the following deficits and impairments:  Pain, Impaired UE functional use, Impaired flexibility, Increased fascial restricitons, Decreased strength, Decreased knowledge of use of DME, Decreased range of motion, Decreased scar mobility, Increased edema, Impaired perceived functional ability, Decreased skin integrity, Postural dysfunction  Visit Diagnosis: Aftercare following surgery for neoplasm  Muscle weakness (generalized)  Acute pain of left shoulder  Stiffness of left shoulder, not elsewhere classified     Problem List Patient Active Problem List   Diagnosis Date Noted  . Numbness 02/04/2016  . Cognitive changes 02/04/2016  . Other fatigue 02/04/2016    Junious Silk, PT 05/05/2019, 8:13 PM  [facility] [address] [place], [place], [address] Phone: [phone]   Fax:  [phone]  Name: ATIYA YERA MRN: [id number] Date of Birth: 1970-10-12

## 2019-05-10 ENCOUNTER — Ambulatory Visit: Payer: Federal, State, Local not specified - PPO | Attending: General Surgery | Admitting: Physical Therapy

## 2019-05-10 ENCOUNTER — Other Ambulatory Visit: Payer: Self-pay

## 2019-05-10 DIAGNOSIS — M25612 Stiffness of left shoulder, not elsewhere classified: Secondary | ICD-10-CM | POA: Insufficient documentation

## 2019-05-10 DIAGNOSIS — Z483 Aftercare following surgery for neoplasm: Secondary | ICD-10-CM | POA: Diagnosis not present

## 2019-05-10 DIAGNOSIS — M25512 Pain in left shoulder: Secondary | ICD-10-CM | POA: Insufficient documentation

## 2019-05-10 DIAGNOSIS — M6281 Muscle weakness (generalized): Secondary | ICD-10-CM | POA: Diagnosis not present

## 2019-05-10 NOTE — Therapy (Signed)
[facility] [address] [place], [place], [address] Phone: [phone]   Fax:  [phone]  Physical Therapy Treatment  Patient Details  Name: Sheena Jarvis MRN: [id number] Date of Birth: [birth year] Referring Provider (PT): Dr.Byerly    Encounter Date: 05/10/2019  PT End of Session - 05/10/19 1639    Visit Number  21    Number of Visits  37    Date for PT Re-Evaluation  06/15/19    PT Start Time  1446    PT Stop Time  1534    PT Time Calculation (min)  48 min    Activity Tolerance  Patient tolerated treatment well;Patient limited by pain   ROM limited by pain and cording   Behavior During Therapy  [facility] for tasks assessed/performed       Past Medical History:  Diagnosis Date  . Pneumonia   . Vision abnormalities     Past Surgical History:  Procedure Laterality Date  . APPENDECTOMY    . BREAST CYST ASPIRATION Left   . HIP ARTHROSCOPY    . MASS EXCISION Left 02/10/2019   Procedure: EXCISION OF LEFT BREAST MASS;  Surgeon: Almond Lint, MD;  Location: [facility];  Service: General;  Laterality: Left;    There were no vitals filed for this visit.  Subjective Assessment - 05/10/19 1643    Subjective  Pt reports she had dry needling on Saturday and still having some axillary swelling from that treatment.  Pt has bruising on the Left pectoralis region near muscle attachment.    Pertinent History  Pt has removed of benign breast mass on 02/10/2019 with complication of seroma and clogged drain. She is fearful it may be getting infected and has just started an antibiotic, but doesn't know if she will be able to tolerate it. She has been wearing her post op binder with added compression. Her drain site has just stopped draining.  She can feel sharp pain from her drain site to her nipple when the seroma starts to fill up She is concerned pt tends to scar down    Patient Stated Goals  To get her arm back to  normal    Currently in Pain?  No/denies         [facility] PT Assessment - 05/10/19 0001      AROM   Right Shoulder Flexion  150 Degrees   with a lot of pain     abduction max 130 deg after treatment             [facility] Adult PT Treatment/Exercise - 05/10/19 0001      Shoulder Exercises: Seated   Flexion Limitations  flexion performed several times with 4lb weight - demonstrates winging and no scapular control with lowering of UE from flexion      Manual Therapy   Joint Mobilization  to [facility] joint a/p and traction    Soft tissue mobilization  pin and stretch to corded tissue; oppositional movement with traction to cording; traction using cupping at each end; cupping with movement along corded tissue                  PT Long Term Goals - 05/02/19 0855      PT LONG TERM GOAL #2   Title  Pt will have 150 degrees of left shoulder flexion and abduction so that she can return to normal activities    Baseline  137 after treatment  Status  On-going      PT LONG TERM GOAL #3   Title  Pt will report she is 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  Pt had some increased ROM after treatment but not as much as with dry needling. However, she has not had overall progression of ROM and tissue seems to tighten back up to 119 deg of abduction noted at the start of each session. ROM at the end of session today caused "sparkling" nerve sensations down the arm into her hand.  She feels the nerve entrapment with compression on the more distal dimpled region in  the axilla.  PT will contiue to attempt variety of methods to release corded and adhesive tissue.    PT Treatment/Interventions  ADLs/Self Care Home Management;Therapeutic exercise;Orthotic Fit/Training;Patient/family education;Manual techniques;Manual lymph drainage;Passive range of motion;Scar mobilization;Taping;Functional mobility training;DME Instruction;Therapeutic activities;Neuromuscular re-education;Dry needling    PT Next Visit Plan  re-assess ROM, continue STM, dry needling, cupping, IASTM to lats and fiberous tissue, add DN to pecs and subscap; myofascial release to shoulder girdle    Consulted and Agree with Plan of Care  Patient       Patient will benefit from skilled therapeutic intervention in order to improve the following deficits and impairments:  Pain, Impaired UE functional use, Impaired flexibility, Increased fascial restricitons, Decreased strength, Decreased knowledge of use of DME, Decreased range of motion, Decreased scar mobility, Increased edema, Impaired perceived functional ability, Decreased skin integrity, Postural dysfunction  Visit Diagnosis: Aftercare following surgery for neoplasm  Muscle weakness (generalized)  Acute pain of left shoulder  Stiffness of left shoulder, not elsewhere classified     Problem List Patient Active Problem List   Diagnosis Date Noted  . Numbness 02/04/2016  . Cognitive changes 02/04/2016  . Other fatigue 02/04/2016    Jule Ser, PT 05/10/2019, 4:55 PM  [facility] [address] [place], [place], [address] Phone: [phone]   Fax:  [phone]  Name: Sheena Jarvis MRN: [id number] Date of Birth: [birth year]

## 2019-05-12 ENCOUNTER — Ambulatory Visit: Payer: Federal, State, Local not specified - PPO | Admitting: Physical Therapy

## 2019-05-12 ENCOUNTER — Other Ambulatory Visit: Payer: Self-pay

## 2019-05-12 ENCOUNTER — Encounter: Payer: Self-pay | Admitting: Physical Therapy

## 2019-05-12 DIAGNOSIS — M25612 Stiffness of left shoulder, not elsewhere classified: Secondary | ICD-10-CM

## 2019-05-12 DIAGNOSIS — Z483 Aftercare following surgery for neoplasm: Secondary | ICD-10-CM | POA: Diagnosis not present

## 2019-05-12 DIAGNOSIS — M6281 Muscle weakness (generalized): Secondary | ICD-10-CM | POA: Diagnosis not present

## 2019-05-12 DIAGNOSIS — M25512 Pain in left shoulder: Secondary | ICD-10-CM

## 2019-05-12 NOTE — Therapy (Signed)
[facility] [address] [place], [place], [address] Phone: [phone]   Fax:  [phone]  Physical Therapy Treatment  Patient Details  Name: Sheena Jarvis MRN: [id number] Date of Birth: 03-01-1971 Referring Provider (PT): Dr.Byerly    Encounter Date: 05/12/2019  PT End of Session - 05/12/19 0802    Visit Number  22    Number of Visits  37    Date for PT Re-Evaluation  06/15/19    PT Start Time  0802    PT Stop Time  0849    PT Time Calculation (min)  47 min    Activity Tolerance  Patient tolerated treatment well;Patient limited by pain    Behavior During Therapy  [facility] for tasks assessed/performed       Past Medical History:  Diagnosis Date  . Pneumonia   . Vision abnormalities     Past Surgical History:  Procedure Laterality Date  . APPENDECTOMY    . BREAST CYST ASPIRATION Left   . HIP ARTHROSCOPY    . MASS EXCISION Left 02/10/2019   Procedure: EXCISION OF LEFT BREAST MASS;  Surgeon: Almond Lint, MD;  Location: [facility];  Service: General;  Laterality: Left;    There were no vitals filed for this visit.  Subjective Assessment - 05/12/19 0803    Subjective  Pt is still hitting a wall with the ROM.  She is doing all the correct exercises to strengthen scapula and GH joint including tricep and serratus.  She is struggling due to lack of ROM that continues to tighten back up between sessions.                       [facility] Adult PT Treatment/Exercise - 05/12/19 0001      Manual Therapy   Soft tissue mobilization  pin and stretch to corded tissue; oppositional movement with traction to cording; IASTM with traction along corded tissue       Trigger Point Dry Needling - 05/12/19 0001    Consent Given?  Yes    Education Handout Provided  Previously provided    Other Dry Needling  fiberous cords at and below dimpling above inciscion down to just below incision - over rib    estim used one minute per segment of cording               PT Long Term Goals - 05/12/19 0927      PT LONG TERM GOAL #2   Title  Pt will have 150 degrees of left shoulder flexion and abduction so that she can return to normal activities    Baseline  125 after treatment today    Status  On-going      PT LONG TERM GOAL #3   Title  Pt will report she is able to do her job that involves manual work with both arms with no increase in pain or cording    Baseline  cording increases with close chain exercises; back to work this week and is very difficult to perform 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  Today's measurement was done with compression bra on, but still seems to be hovering in the same range of motion.  Pt felt a good stretch to the cord after needling and had visibly more PROM in supine without compaint of anterior shoulder impingment.    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    Recommended Other Services  pt will contact  surgeon to see if there is any surgical option to release adhesions    Consulted and Agree with Plan of Care  Patient       Patient will benefit from skilled therapeutic intervention in order to improve the following deficits and impairments:  Pain, Impaired UE functional use, Impaired flexibility, Increased fascial restricitons, Decreased strength, Decreased knowledge of use of DME, Decreased range of motion, Decreased scar mobility, Increased edema, Impaired perceived functional ability, Decreased skin integrity, Postural dysfunction  Visit Diagnosis: Aftercare following surgery for neoplasm  Muscle weakness (generalized)  Acute pain of left shoulder  Stiffness of left shoulder, not elsewhere classified     Problem List Patient Active Problem List   Diagnosis Date Noted  . Numbness 02/04/2016  . Cognitive changes 02/04/2016  . Other fatigue 02/04/2016    Jule Ser, PT 05/12/2019, 10:30 AM  [facility] [address] [place], [place], [address] Phone: [phone]   Fax:  [phone]  Name: Sheena Jarvis MRN: [id number] Date of Birth: October 13, 1970

## 2019-05-16 ENCOUNTER — Ambulatory Visit: Payer: Federal, State, Local not specified - PPO | Admitting: Physical Therapy

## 2019-05-16 ENCOUNTER — Other Ambulatory Visit: Payer: Self-pay

## 2019-05-16 ENCOUNTER — Encounter: Payer: Self-pay | Admitting: Physical Therapy

## 2019-05-16 DIAGNOSIS — M6281 Muscle weakness (generalized): Secondary | ICD-10-CM

## 2019-05-16 DIAGNOSIS — M25512 Pain in left shoulder: Secondary | ICD-10-CM

## 2019-05-16 DIAGNOSIS — Z483 Aftercare following surgery for neoplasm: Secondary | ICD-10-CM

## 2019-05-16 DIAGNOSIS — M25612 Stiffness of left shoulder, not elsewhere classified: Secondary | ICD-10-CM

## 2019-05-16 NOTE — Therapy (Signed)
[facility] [address], [address] [place], [place], [address] Phone: [phone]   Fax:  [phone]  Physical Therapy Treatment  Patient Details  Name: Sheena Jarvis MRN: [id number] Date of Birth: Aug 03, 1970 Referring Provider (PT): Dr.Byerly    Encounter Date: 05/16/2019  PT End of Session - 05/16/19 1640    Visit Number  23    Number of Visits  37    Date for PT Re-Evaluation  06/15/19    PT Start Time  1331    PT Stop Time  1415    PT Time Calculation (min)  44 min    Activity Tolerance  Patient tolerated treatment well;Patient limited by pain    Behavior During Therapy  [facility] for tasks assessed/performed       Past Medical History:  Diagnosis Date  . Pneumonia   . Vision abnormalities     Past Surgical History:  Procedure Laterality Date  . APPENDECTOMY    . BREAST CYST ASPIRATION Left   . HIP ARTHROSCOPY    . MASS EXCISION Left 02/10/2019   Procedure: EXCISION OF LEFT BREAST MASS;  Surgeon: Almond Lint, MD;  Location: [facility];  Service: General;  Laterality: Left;    There were no vitals filed for this visit.      [facility] PT Assessment - 05/16/19 0001      AROM   Left Shoulder ABduction  129 Degrees   ended on 136                  [facility] Adult PT Treatment/Exercise - 05/16/19 0001      Manual Therapy   Joint Mobilization  mobs to [facility] joint A/P direction    Soft tissue mobilization  pin and stretch to corded tissue; oppositional movement with traction to cording; IASTM with traction along corded tissue; pec minor and deltoid trigger point release       Trigger Point Dry Needling - 05/16/19 0001    Consent Given?  Yes    Education Handout Provided  Previously provided    Other Dry Needling  fiberous cords at and below dimpling above inciscion down to just below incision - over rib   estim used one minute per segment of cording               PT Long Term  Goals - 05/12/19 0927      PT LONG TERM GOAL #2   Title  Pt will have 150 degrees of left shoulder flexion and abduction so that she can return to normal activities    Baseline  125 after treatment today    Status  On-going      PT LONG TERM GOAL #3   Title  Pt will report she is able to do her job that involves manual work with both arms with no increase in pain or cording    Baseline  cording increases with close chain exercises; back to work this week and is very difficult 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  Pt appears to have less depth of dimpling in axilla that decreased further with pin and stretch to corded tissue.  PT noticed more tension on the lower horizontal tissues and when applying opposing pressure to this tissue pt felt more stretch that when applying to the superior tissues of the cord.  Pt will continue to benefit from manual techniques to improve ROM for ability to perform functional work and home related tasks.    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    Consulted and Agree with Plan of Care  Patient       Patient will benefit from skilled therapeutic intervention in order to improve the following deficits and impairments:  Pain, Impaired UE functional use, Impaired flexibility, Increased fascial restricitons, Decreased strength, Decreased knowledge of use of DME, Decreased range of motion, Decreased scar mobility, Increased edema, Impaired perceived functional ability, Decreased skin integrity, Postural dysfunction  Visit Diagnosis: Aftercare following surgery for neoplasm  Muscle weakness (generalized)  Acute pain of left shoulder  Stiffness of left shoulder, not elsewhere classified     Problem List Patient Active Problem List   Diagnosis Date Noted  . Numbness 02/04/2016  . Cognitive changes 02/04/2016  . Other fatigue 02/04/2016    Jule Ser, PT 05/16/2019, 4:44 PM  [facility] [address], [place], [place], [address] Phone: [phone]   Fax:  [phone]  Name: Sheena Jarvis MRN: [id number] Date of Birth: Apr 30, 1971

## 2019-05-17 ENCOUNTER — Other Ambulatory Visit: Payer: Self-pay | Admitting: General Surgery

## 2019-05-17 DIAGNOSIS — M7989 Other specified soft tissue disorders: Secondary | ICD-10-CM | POA: Diagnosis not present

## 2019-05-17 DIAGNOSIS — Z803 Family history of malignant neoplasm of breast: Secondary | ICD-10-CM | POA: Diagnosis not present

## 2019-05-17 DIAGNOSIS — R922 Inconclusive mammogram: Secondary | ICD-10-CM | POA: Diagnosis not present

## 2019-05-17 DIAGNOSIS — M25612 Stiffness of left shoulder, not elsewhere classified: Secondary | ICD-10-CM | POA: Diagnosis not present

## 2019-05-19 ENCOUNTER — Ambulatory Visit: Payer: Federal, State, Local not specified - PPO | Admitting: Physical Therapy

## 2019-05-19 ENCOUNTER — Other Ambulatory Visit: Payer: Self-pay

## 2019-05-19 ENCOUNTER — Encounter: Payer: Self-pay | Admitting: Physical Therapy

## 2019-05-19 DIAGNOSIS — M6281 Muscle weakness (generalized): Secondary | ICD-10-CM

## 2019-05-19 DIAGNOSIS — Z483 Aftercare following surgery for neoplasm: Secondary | ICD-10-CM

## 2019-05-19 DIAGNOSIS — M25612 Stiffness of left shoulder, not elsewhere classified: Secondary | ICD-10-CM | POA: Diagnosis not present

## 2019-05-19 DIAGNOSIS — M25512 Pain in left shoulder: Secondary | ICD-10-CM

## 2019-05-19 NOTE — Therapy (Signed)
[facility] [address], [address] [place], [place], [address] Phone: [phone]   Fax:  [phone]  Physical Therapy Treatment  Patient Details  Name: Sheena Jarvis MRN: [id number] Date of Birth: February 14, 1971 Referring Provider (PT): Dr.Byerly    Encounter Date: 05/19/2019  PT End of Session - 05/19/19 0902    Visit Number  24    Number of Visits  37    Date for PT Re-Evaluation  06/15/19    PT Start Time  0800    PT Stop Time  0846    PT Time Calculation (min)  46 min    Activity Tolerance  Patient tolerated treatment well;Patient limited by pain    Behavior During Therapy  [facility] for tasks assessed/performed       Past Medical History:  Diagnosis Date  . Pneumonia   . Vision abnormalities     Past Surgical History:  Procedure Laterality Date  . APPENDECTOMY    . BREAST CYST ASPIRATION Left   . HIP ARTHROSCOPY    . MASS EXCISION Left 02/10/2019   Procedure: EXCISION OF LEFT BREAST MASS;  Surgeon: Almond Lint, MD;  Location: [facility];  Service: General;  Laterality: Left;    There were no vitals filed for this visit.  Subjective Assessment - 05/19/19 0924    Subjective  Pt reports she had a good discussion with Dr. Donell Beers about doing a release to the corded tissues.  There is no surgery date yet, but will be in upcoming.    Pertinent History  Pt has removed of benign breast mass on 02/10/2019 with complication of seroma and clogged drain. She is fearful it may be getting infected and has just started an antibiotic, but doesn't know if she will be able to tolerate it. She has been wearing her post op binder with added compression. Her drain site has just stopped draining.  She can feel sharp pain from her drain site to her nipple when the seroma starts to fill up She is concerned pt tends to scar down    Patient Stated Goals  To get her arm back to normal    Currently in Pain?  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 in quadruped position and increased with loaded quadruped knees elevated from the mat; scapula dyskinesia also noted in abduction beginning around 90 deg where lateral border is flush with ribcage on the Rt side and has increased abduction of 3 cm away from ribcage    Skin Integrity  pt in supine: Lt shoulder is 6.25 cm away from neutral when at rest      ROM / Strength   AROM / PROM / Strength  PROM      AROM   Right Shoulder Internal Rotation  36 Degrees    Right Shoulder External Rotation  128 Degrees    Left Shoulder Flexion  149 Degrees  Left Shoulder ABduction  127 Degrees   135 after manual and needling   Left Shoulder Internal Rotation  45 Degrees    Left Shoulder External Rotation  108 Degrees      PROM   Overall PROM Comments  Lt shoulder abduction - 124 deg when gets ulnar nerve referral symptoms      Strength   Overall Strength Comments  rhomboids/middle traps on Lt side - 4/5; scapular protraction causes increase nerve ulnar pain in all planes    Strength Assessment Site  Shoulder    Right/Left Shoulder  Right;Left    Left Shoulder Flexion  5/5    Left Shoulder ABduction  5/5   causes increased nerve pain    Left Shoulder Internal Rotation  5/5    Left Shoulder External Rotation  5/5    Left Shoulder Horizontal ABduction  5/5   entire medial border of scapula lifts up   Left Shoulder Horizontal ADduction  5/5      Flexibility   Soft Tissue Assessment /Muscle Length  --      Palpation   Palpation comment  subscapularis,  pec minor, subclavius, teres major, latissimus - Tight and guarded      Special Tests    Special Tests  Rotator Cuff Impingement    Rotator Cuff Impingment tests  Leanord Asal test;Neer impingement test;Empty Can test;Full Can test      Neer Impingement test    Findings  Positive    Side  Left    Comments  nerve symptoms at end range about 125 deg      Hawkins-Kennedy test   Findings  Negative    Side  Left    Comments  restricted IR      Empty Can test   Findings  Positive    Side  Left    Comment  weakness      Full Can test   Findings  Negative    Side  Left                   [facility] Adult PT Treatment/Exercise - 05/19/19 0001      Manual Therapy   Soft tissue mobilization  pec minor, subscap, teres major, latissimus       Trigger Point Dry Needling - 05/19/19 0001    Consent Given?  Yes    Education Handout Provided  Previously provided    Muscles Treated Upper Quadrant  Subscapularis    Pectoralis Minor Response  Twitch response elicited;Palpable increased muscle length    Subscapularis Response  Twitch response elicited;Palpable increased muscle length    Latissimus dorsi Response  Twitch response elicited;Palpable increased muscle length                PT Long Term Goals - 05/19/19 1159      PT LONG TERM GOAL #1   Title  Pt will be independent in sel MLD and use of compression to manage fullness in left upper quadrant    Status  Achieved      PT LONG TERM GOAL #2   Title  Pt will have 150 degrees of left shoulder flexion and abduction so that she can return to normal activities    Baseline  129 prior to treatment; 134 deg after treatment    Status  On-going      PT LONG TERM GOAL #3   Title  Pt will report she is able to do her job that involves manual work with  both arms with no increase in pain or cording    Baseline  was very sore after doing little manual work with a patient yesterday    Status  On-going      PT LONG TERM  GOAL #4   Title  Pt will have decrease in visual and palpable axillary cording by 50%  per photo and verbal report    Baseline  no significant changes, but no swelling from activities over the past two days    Status  On-going            Plan - 05/19/19 1150    Clinical Impression Statement  Pt continues to have impairments as listed above.  She has decreased ROM that is limited from both soft tissue restrictions and weakness.  Of note is the scapular dyskinesia which is limiting her with job related activities and adding to shoulder impingement on what is her dominant side.  Pt will continue to benefit from skilled PT along with other treatments as recommended by her healthcare team so she can address these impairments and regain full functional of her left shoulder which she relies heavily on for her profession.  Pt had some good response from today's treatment which focused on muscle releass as described above. Pt had increased ROM by 6 degrees after today's treatment.    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    Recommended Other Services  planning on upcoming surgery to release adhesions    Consulted and Agree with Plan of Care  Patient       Patient will benefit from skilled therapeutic intervention in order to improve the following deficits and impairments:  Pain, Impaired UE functional use, Impaired flexibility, Increased  fascial restricitons, Decreased strength, Decreased knowledge of use of DME, Decreased range of motion, Decreased scar mobility, Increased edema, Impaired perceived functional ability, Decreased skin integrity, Postural dysfunction  Visit Diagnosis: Aftercare following surgery for neoplasm  Muscle weakness (generalized)  Acute pain of left shoulder  Stiffness of left shoulder, not elsewhere classified     Problem List Patient Active Problem List   Diagnosis Date Noted  . Numbness 02/04/2016  . Cognitive changes 02/04/2016  . Other fatigue 02/04/2016    Jule Ser, PT 05/19/2019, 12:02 PM  [facility] [address], [place], [place], [address] Phone: [phone]   Fax:  [phone]  Name: Sheena Jarvis MRN: [id number] Date of Birth: [birth year]

## 2019-05-23 ENCOUNTER — Encounter: Payer: Federal, State, Local not specified - PPO | Admitting: Physical Therapy

## 2019-05-26 ENCOUNTER — Other Ambulatory Visit: Payer: Self-pay

## 2019-05-26 ENCOUNTER — Ambulatory Visit: Payer: Federal, State, Local not specified - PPO | Admitting: Physical Therapy

## 2019-05-26 ENCOUNTER — Encounter: Payer: Self-pay | Admitting: Physical Therapy

## 2019-05-26 DIAGNOSIS — M25612 Stiffness of left shoulder, not elsewhere classified: Secondary | ICD-10-CM

## 2019-05-26 DIAGNOSIS — M25512 Pain in left shoulder: Secondary | ICD-10-CM

## 2019-05-26 DIAGNOSIS — Z483 Aftercare following surgery for neoplasm: Secondary | ICD-10-CM | POA: Diagnosis not present

## 2019-05-26 DIAGNOSIS — M6281 Muscle weakness (generalized): Secondary | ICD-10-CM

## 2019-05-26 NOTE — Therapy (Signed)
[facility] [address], [place], [place], [address] Phone: [phone]   Fax:  [phone]  Physical Therapy Treatment  Patient Details  Name: Sheena Jarvis MRN: [id number] Date of Birth: 05/31/1970 Referring Provider (PT): Dr.Byerly    Encounter Date: 05/26/2019  PT End of Session - 05/26/19 0803    Visit Number  25    Number of Visits  37    Date for PT Re-Evaluation  06/15/19    PT Start Time  0803    PT Stop Time  0848    PT Time Calculation (min)  45 min    Activity Tolerance  Patient tolerated treatment well;Patient limited by pain    Behavior During Therapy  [facility] for tasks assessed/performed       Past Medical History:  Diagnosis Date  . Pneumonia   . Vision abnormalities     Past Surgical History:  Procedure Laterality Date  . APPENDECTOMY    . BREAST CYST ASPIRATION Left   . HIP ARTHROSCOPY    . MASS EXCISION Left 02/10/2019   Procedure: EXCISION OF LEFT BREAST MASS;  Surgeon: Stark Klein, MD;  Location: [place];  Service: General;  Laterality: Left;    There were no vitals filed for this visit.  Subjective Assessment - 05/26/19 0809    Subjective  Pt is still feeling about the same    Pertinent History  Pt has removed of benign breast mass on 77/08/1285 with complication of seroma and clogged drain. She is fearful it may be getting infected and has just started an antibiotic, but doesn't know if she will be able to tolerate it. She has been wearing her post op binder with added compression. Her drain site has just stopped draining.  She can feel sharp pain from her drain site to her nipple when the seroma starts to fill up She is concerned pt tends to scar down    Patient Stated Goals  To get her arm back to normal    Currently in Pain?  No/denies                       [facility] Adult PT Treatment/Exercise - 05/26/19 0001      Manual Therapy   Soft tissue  mobilization  pec minor, subscap, teres major, latissimus, upper trap, levator       Trigger Point Dry Needling - 05/26/19 0001    Consent Given?  Yes    Education Handout Provided  Previously provided    Muscles Treated Head and Neck  Upper trapezius;Levator scapulae    Muscles Treated Upper Quadrant  Pectoralis major;Pectoralis minor;Infraspinatus;Subscapularis;Deltoid;Latissimus dorsi;Teres major    Upper Trapezius Response  Twitch reponse elicited;Palpable increased muscle length    Levator Scapulae Response  Twitch response elicited;Palpable increased muscle length    Pectoralis Major Response  Twitch response elicited;Palpable increased muscle length    Pectoralis Minor Response  Twitch response elicited;Palpable increased muscle length    Infraspinatus Response  Twitch response elicited;Palpable increased muscle length    Subscapularis Response  Twitch response elicited;Palpable increased muscle length    Deltoid Response  Twitch response elicited;Palpable increased muscle length    Latissimus dorsi Response  Twitch response elicited;Palpable increased muscle length    Teres major Response  Twitch response elicited;Palpable increased muscle length                PT Long Term Goals - 05/19/19 1159  PT LONG TERM GOAL #1   Title  Pt will be independent in sel MLD and use of compression to manage fullness in left upper quadrant    Status  Achieved      PT LONG TERM GOAL #2   Title  Pt will have 150 degrees of left shoulder flexion and abduction so that she can return to normal activities    Baseline  129 prior to treatment; 134 deg after treatment    Status  On-going      PT LONG TERM GOAL #3   Title  Pt will report she is able to do her job that involves manual work with both arms with no increase in pain or cording    Baseline  was very sore after doing little manual work with a patient yesterday    Status  On-going      PT LONG TERM GOAL #4   Title  Pt will  have decrease in visual and palpable axillary cording by 50%  per photo and verbal report    Baseline  no significant changes, but no swelling from activities over the past two days    Status  On-going            Plan - 05/26/19 0936    Clinical Impression Statement  Pt did get more ROM after treatment today and had less winging of scapula.  Pt is demonstrating more ROM with more normal scapular movement after treatment today.  Prior to treatment ROM was 121 deg abduction and post treatment 129 on the Lt shoulder.  She will continue to benefit from skilled to work on improved scapular ROM and maintain cording length while she is waiting to be scheduled for surgery and will decrease frequency to 1x/ week.    Comorbidities  breast surgery with seroma. previous history of increased scarring after other surgeries    PT Treatment/Interventions  ADLs/Self Care Home Management;Therapeutic exercise;Orthotic Fit/Training;Patient/family education;Manual techniques;Manual lymph drainage;Passive range of motion;Scar mobilization;Taping;Functional mobility training;DME Instruction;Therapeutic activities;Neuromuscular re-education;Dry needling    PT Next Visit Plan  re-assess ROM, continue STM and stretching in all directions to corded tissue, focus on scapular mobility - pecs, upper traps, levator, subscap, infraspinatus, lats, teres major    Consulted and Agree with Plan of Care  Patient       Patient will benefit from skilled therapeutic intervention in order to improve the following deficits and impairments:  Pain, Impaired UE functional use, Impaired flexibility, Increased fascial restricitons, Decreased strength, Decreased knowledge of use of DME, Decreased range of motion, Decreased scar mobility, Increased edema, Impaired perceived functional ability, Decreased skin integrity, Postural dysfunction  Visit Diagnosis: Aftercare following surgery for neoplasm  Muscle weakness (generalized)  Acute  pain of left shoulder  Stiffness of left shoulder, not elsewhere classified     Problem List Patient Active Problem List   Diagnosis Date Noted  . Numbness 02/04/2016  . Cognitive changes 02/04/2016  . Other fatigue 02/04/2016    Junious Silk, PT 05/26/2019, 12:26 PM  [facility] [address] [place], [place], [address] Phone: [phone]   Fax:  [phone]  Name: Sheena Jarvis MRN: [id number] Date of Birth: 17-Nov-1970

## 2019-06-02 ENCOUNTER — Encounter: Payer: Federal, State, Local not specified - PPO | Admitting: Physical Therapy

## 2019-06-03 ENCOUNTER — Encounter: Payer: Federal, State, Local not specified - PPO | Admitting: Physical Therapy

## 2019-06-06 ENCOUNTER — Encounter: Payer: Federal, State, Local not specified - PPO | Admitting: Physical Therapy

## 2019-06-09 ENCOUNTER — Ambulatory Visit: Payer: Federal, State, Local not specified - PPO | Attending: General Surgery | Admitting: Physical Therapy

## 2019-06-09 ENCOUNTER — Other Ambulatory Visit: Payer: Self-pay

## 2019-06-09 DIAGNOSIS — M6281 Muscle weakness (generalized): Secondary | ICD-10-CM | POA: Insufficient documentation

## 2019-06-09 DIAGNOSIS — M25612 Stiffness of left shoulder, not elsewhere classified: Secondary | ICD-10-CM | POA: Insufficient documentation

## 2019-06-09 DIAGNOSIS — Z483 Aftercare following surgery for neoplasm: Secondary | ICD-10-CM | POA: Insufficient documentation

## 2019-06-09 DIAGNOSIS — M25512 Pain in left shoulder: Secondary | ICD-10-CM | POA: Diagnosis not present

## 2019-06-09 NOTE — Therapy (Signed)
[facility] [address] [place], [place], [address] Phone: [phone]   Fax:  [phone]  Physical Therapy Treatment  Patient Details  Name: Sheena Jarvis MRN: [id number] Date of Birth: [birth year] Referring Provider (PT): Dr.Byerly    Encounter Date: 06/09/2019  PT End of Session - 06/09/19 0803    Visit Number  26    Number of Visits  37    Date for PT Re-Evaluation  06/15/19    PT Start Time  0800    PT Stop Time  0847    PT Time Calculation (min)  47 min    Activity Tolerance  Patient tolerated treatment well;Patient limited by pain    Behavior During Therapy  [facility] for tasks assessed/performed       Past Medical History:  Diagnosis Date  . Pneumonia   . Vision abnormalities     Past Surgical History:  Procedure Laterality Date  . APPENDECTOMY    . BREAST CYST ASPIRATION Left   . HIP ARTHROSCOPY    . MASS EXCISION Left 02/10/2019   Procedure: EXCISION OF LEFT BREAST MASS;  Surgeon: Almond Lint, MD;  Location: [facility];  Service: General;  Laterality: Left;    There were no vitals filed for this visit.  Subjective Assessment - 06/09/19 0803    Subjective  Going without the binder for one day causes the Lt breast to swell and then adding more foam causes pitting edema.  Pt states that without the soft tissue work she can also feel that her ROM is more difficult and feels the nerve pain more due to cording getting worse.    Pertinent History  Pt has removed of benign breast mass on 02/10/2019 with complication of seroma and clogged drain. She is fearful it may be getting infected and has just started an antibiotic, but doesn't know if she will be able to tolerate it. She has been wearing her post op binder with added compression. Her drain site has just stopped draining.  She can feel sharp pain from her drain site to her nipple when the seroma starts to fill up She is concerned pt tends to  scar down    Patient Stated Goals  To get her arm back to normal    Currently in Pain?  No/denies                       [facility] Adult PT Treatment/Exercise - 06/09/19 0001      Manual Therapy   Soft tissue mobilization  pin and stretch to corded tissue; oppositional movement with traction to cording; IASTM with traction along corded tissue; lat release       Trigger Point Dry Needling - 06/09/19 0001    Other Dry Needling  fiberous cords at and below dimpling above inciscion down to just below incision - over rib   estim used one minute per segment of cording   Latissimus dorsi Response  Twitch response elicited;Palpable increased muscle length                PT Long Term Goals - 05/19/19 1159      PT LONG TERM GOAL #1   Title  Pt will be independent in sel MLD and use of compression to manage fullness in left upper quadrant    Status  Achieved      PT LONG TERM GOAL #2   Title  Pt will have 150 degrees  of left shoulder flexion and abduction so that she can return to normal activities    Baseline  129 prior to treatment; 134 deg after treatment    Status  On-going      PT LONG TERM GOAL #3   Title  Pt will report she is able to do her job that involves manual work with both arms with no increase in pain or cording    Baseline  was very sore after doing little manual work with a patient yesterday    Status  On-going      PT LONG TERM GOAL #4   Title  Pt will have decrease in visual and palpable axillary cording by 50%  per photo and verbal report    Baseline  no significant changes, but no swelling from activities over the past two days    Status  On-going            Plan - 06/09/19 1155    Clinical Impression Statement  Pt has maintained fairly consistent ROM with Lt shoulder abduction and flexion.  She is had more thickness in the cording today after the break in her PT treatments.  Cording was more separated into several smaller cords and was  more pliable after dry needling and manaul techniques.  Pt will continue to benefit from skilled PT to avoid regression of ROM and increased thickening of cording.    Comorbidities  breast surgery with seroma. previous history of increased scarring after other surgeries    PT Treatment/Interventions  ADLs/Self Care Home Management;Therapeutic exercise;Orthotic Fit/Training;Patient/family education;Manual techniques;Manual lymph drainage;Passive range of motion;Scar mobilization;Taping;Functional mobility training;DME Instruction;Therapeutic activities;Neuromuscular re-education;Dry needling    PT Next Visit Plan  re-eval ROM strength and scap wining, continue STM and stretching in all directions to corded tissue, focus on scapular mobility - pecs, upper traps, levator, subscap, infraspinatus, lats, teres major    PT Home Exercise Plan  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    Consulted and Agree with Plan of Care  Patient       Patient will benefit from skilled therapeutic intervention in order to improve the following deficits and impairments:  Pain, Impaired UE functional use, Impaired flexibility, Increased fascial restricitons, Decreased strength, Decreased knowledge of use of DME, Decreased range of motion, Decreased scar mobility, Increased edema, Impaired perceived functional ability, Decreased skin integrity, Postural dysfunction  Visit Diagnosis: Aftercare following surgery for neoplasm  Muscle weakness (generalized)  Acute pain of left shoulder  Stiffness of left shoulder, not elsewhere classified     Problem List Patient Active Problem List   Diagnosis Date Noted  . Numbness 02/04/2016  . Cognitive changes 02/04/2016  . Other fatigue 02/04/2016    Jule Ser, PT 06/09/2019, 12:34 PM  [facility] [address] [place], [place], [address] Phone:  [phone]   Fax:  [phone]  Name: IASHA MCCALISTER MRN: [id number] Date of Birth: Sep 25, 1970

## 2019-06-14 ENCOUNTER — Ambulatory Visit: Payer: Federal, State, Local not specified - PPO | Admitting: Physical Therapy

## 2019-06-14 ENCOUNTER — Encounter: Payer: Self-pay | Admitting: Physical Therapy

## 2019-06-14 ENCOUNTER — Other Ambulatory Visit: Payer: Self-pay

## 2019-06-14 DIAGNOSIS — M6281 Muscle weakness (generalized): Secondary | ICD-10-CM

## 2019-06-14 DIAGNOSIS — M25512 Pain in left shoulder: Secondary | ICD-10-CM

## 2019-06-14 DIAGNOSIS — M25612 Stiffness of left shoulder, not elsewhere classified: Secondary | ICD-10-CM | POA: Diagnosis not present

## 2019-06-14 DIAGNOSIS — Z483 Aftercare following surgery for neoplasm: Secondary | ICD-10-CM | POA: Diagnosis not present

## 2019-06-14 NOTE — Therapy (Signed)
[facility] [address] [place], [place], [address] Phone: [phone]   Fax:  [phone]  Physical Therapy Treatment  Patient Details  Name: Sheena Jarvis MRN: [id number] Date of Birth: [birth year] Referring Provider (PT): Dr.Byerly    Encounter Date: 06/14/2019  PT End of Session - 06/14/19 0803    Visit Number  27    Number of Visits  37    Date for PT Re-Evaluation  09/06/19    PT Start Time  0800    PT Stop Time  0845    PT Time Calculation (min)  45 min    Activity Tolerance  Patient tolerated treatment well;Patient limited by pain    Behavior During Therapy  [facility] for tasks assessed/performed       Past Medical History:  Diagnosis Date  . Pneumonia   . Vision abnormalities     Past Surgical History:  Procedure Laterality Date  . APPENDECTOMY    . BREAST CYST ASPIRATION Left   . HIP ARTHROSCOPY    . MASS EXCISION Left 02/10/2019   Procedure: EXCISION OF LEFT BREAST MASS;  Surgeon: Stark Klein, MD;  Location: [facility];  Service: General;  Laterality: Left;    There were no vitals filed for this visit.  Subjective Assessment - 06/14/19 0804    Subjective  There is less swelling than last time.  She is getting 150 flexion and 119 deg abduction at the start of the session today. Shoulder still feels heavy almost similar to vascular stress, harder to move.    Pertinent History  Pt has removed of benign breast mass on 78/01/3809 with complication of seroma and clogged drain. She is fearful it may be getting infected and has just started an antibiotic, but doesn't know if she will be able to tolerate it. She has been wearing her post op binder with added compression. Her drain site has just stopped draining.  She can feel sharp pain from her drain site to her nipple when the seroma starts to fill up She is concerned pt tends to scar down    Patient Stated Goals  To get her arm back to normal     Currently in Pain?  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       Observation/Other Assessments   Observations  Pt has medial border lift of scapula in prone on Lt side; reduced after manual techniques to lats; pre-treatment Shoulder ROM there was stiffness and tension at lats and fascial tissue around inferior angle of scapula    Skin Integrity  pt in supine: Lt shoulder is 6.25 cm away from neutral when at rest      AROM   Right Shoulder Internal Rotation  45 Degrees    Right Shoulder External Rotation  --    Left Shoulder Flexion  150 Degrees   160 post treatment   Left Shoulder ABduction  119 Degrees   130 after manual and needling   Left Shoulder Internal Rotation  20 Degrees    Left Shoulder  External Rotation  --      PROM   Overall PROM Comments  Lt shoulder abduction - 124 deg when gets ulnar nerve referral symptoms      Strength   Overall Strength Comments  --    Left Shoulder Flexion  5/5    Left Shoulder ABduction  5/5   causes increased nerve pain    Left Shoulder Internal Rotation  5/5    Left Shoulder External Rotation  5/5    Left Shoulder Horizontal ABduction  5/5   entire medial border of scapula lifts up   Left Shoulder Horizontal ADduction  5/5      Palpation   Palpation comment  subscapularis, pec minor, subclavius, teres major, latissimus - Tight and guarded      Special Tests    Special Tests  Rotator Cuff Impingement    Rotator Cuff Impingment tests  Leanord Asal test;Neer impingement test;Empty Can test;Full Can test      Neer Impingement test    Findings  Positive    Comments  nerve symptoms at end range about 125 deg      Hawkins-Kennedy test    Findings  Negative    Side  Left    Comments  restricted IR      Empty Can test   Findings  Positive    Side  Left    Comment  weakness      Full Can test   Findings  Negative    Side  Left      above are most recent measurements - strength and special tests taken 1/14             [facility] Adult PT Treatment/Exercise - 06/14/19 0001      Manual Therapy   Soft tissue mobilization  pin and stretch to corded tissue; oppositional movement with traction to cording; lat release, RTC release   160 flex and 130 abduction      Trigger Point Dry Needling - 06/14/19 0001    Muscles Treated Upper Quadrant  Teres minor    Other Dry Needling  fiberous cords at and below dimpling above inciscion down to just below incision - over rib   estim used one minute per segment of cording   Infraspinatus Response  Twitch response elicited;Palpable increased muscle length    Subscapularis Response  Twitch response elicited;Palpable increased muscle length    Latissimus dorsi Response  Twitch response elicited;Palpable increased muscle length    Teres minor Response  Twitch response elicited;Palpable increased muscle length                PT Long Term Goals - 06/14/19 0912      PT LONG TERM GOAL #2   Title  Pt will have 150 degrees of left shoulder flexion and abduction so that she can return to normal activities    Baseline  averaging about 120 deg of abduction and 145 deg of flexion at this time    Time  12    Period  Weeks    Status  On-going    Target Date  09/06/19      PT LONG TERM GOAL #3   Title  Pt will report she is able to do her job that involves manual work with both arms with no increase in pain or cording    Baseline  difficutly due to increased stiffness and limted with corded tissue    Time  12    Period  Weeks  Status  On-going    Target Date  09/06/19      PT LONG TERM GOAL #4   Title  Pt will have decrease in visual and palpable axillary cording by  50%  per photo and verbal report    Time  12    Period  Weeks    Status  On-going            Plan - 06/14/19 0905    Clinical Impression Statement  Pt continues to maintain between sessions when she is coming in at least 1x/week.  Pt will have surgical release of soft tissue and will need to continue skilled PT to reduce risk of soft tissue adhesions and help with improved ROM. Pt is a licensed PT and is knowledgeable about how to correctly strengthen the shoulder and periscapular muscles for return to maximum function.  She will need skilled PT in order to regain GH joint and scapular motion to avoid scapular dyskinesis caused by soft tissue restrictions.  She will also need skilled PT for the prevention of adhesions building up around the nerve and soft tissue that may limit the ROM of these tissues and prevent full functional recovery.    Comorbidities  breast surgery with seroma. previous history of increased scarring after other surgeries    PT Treatment/Interventions  ADLs/Self Care Home Management;Therapeutic exercise;Orthotic Fit/Training;Patient/family education;Manual techniques;Manual lymph drainage;Passive range of motion;Scar mobilization;Taping;Functional mobility training;DME Instruction;Therapeutic activities;Neuromuscular re-education;Dry needling    PT Next Visit Plan  ROM flex and abduction; STM and dry needling to cording, RTC, lats, upper trap, levator, pecs as needed for improved mobility and ROM    Consulted and Agree with Plan of Care  Patient       Patient will benefit from skilled therapeutic intervention in order to improve the following deficits and impairments:  Pain, Impaired UE functional use, Impaired flexibility, Increased fascial restricitons, Decreased strength, Decreased knowledge of use of DME, Decreased range of motion, Decreased scar mobility, Increased edema, Impaired perceived functional ability, Decreased skin integrity, Postural dysfunction  Visit  Diagnosis: Muscle weakness (generalized)  Aftercare following surgery for neoplasm  Acute pain of left shoulder  Stiffness of left shoulder, not elsewhere classified     Problem List Patient Active Problem List   Diagnosis Date Noted  . Numbness 02/04/2016  . Cognitive changes 02/04/2016  . Other fatigue 02/04/2016    Junious Silk, PT 06/14/2019, 9:15 AM  [facility] [address] [place], [place], [address] Phone: [phone]   Fax:  [phone]  Name: SAHAANA WEITMAN MRN: [id number] Date of Birth: January 20, 1971

## 2019-06-15 ENCOUNTER — Other Ambulatory Visit: Payer: Self-pay

## 2019-06-15 ENCOUNTER — Encounter (HOSPITAL_BASED_OUTPATIENT_CLINIC_OR_DEPARTMENT_OTHER): Payer: Self-pay | Admitting: General Surgery

## 2019-06-18 ENCOUNTER — Other Ambulatory Visit (HOSPITAL_COMMUNITY)
Admission: RE | Admit: 2019-06-18 | Discharge: 2019-06-18 | Disposition: A | Discharge status: Home / Self Care | Payer: Federal, State, Local not specified - PPO | Source: Ambulatory Visit | Attending: General Surgery | Admitting: General Surgery

## 2019-06-18 DIAGNOSIS — Z01812 Encounter for preprocedural laboratory examination: Secondary | ICD-10-CM | POA: Diagnosis not present

## 2019-06-18 DIAGNOSIS — Z20822 Contact with and (suspected) exposure to covid-19: Secondary | ICD-10-CM | POA: Insufficient documentation

## 2019-06-18 LAB — SARS CORONAVIRUS 2 (TAT 6-24 HRS): SARS Coronavirus 2: NEGATIVE

## 2019-06-21 ENCOUNTER — Other Ambulatory Visit: Payer: Self-pay

## 2019-06-21 ENCOUNTER — Ambulatory Visit: Payer: Federal, State, Local not specified - PPO | Admitting: Physical Therapy

## 2019-06-21 ENCOUNTER — Encounter: Payer: Self-pay | Admitting: Physical Therapy

## 2019-06-21 DIAGNOSIS — M6281 Muscle weakness (generalized): Secondary | ICD-10-CM

## 2019-06-21 DIAGNOSIS — Z483 Aftercare following surgery for neoplasm: Secondary | ICD-10-CM

## 2019-06-21 DIAGNOSIS — M25612 Stiffness of left shoulder, not elsewhere classified: Secondary | ICD-10-CM | POA: Diagnosis not present

## 2019-06-21 DIAGNOSIS — M25512 Pain in left shoulder: Secondary | ICD-10-CM | POA: Diagnosis not present

## 2019-06-21 NOTE — Therapy (Addendum)
[facility] [address], [place], [place], [address] Phone: [phone]   Fax:  [phone]  Physical Therapy Treatment  Patient Details  Name: Sheena Jarvis MRN: [id number] Date of Birth: 20-May-1970 Referring Provider (PT): Dr.Byerly    Encounter Date: 06/21/2019  PT End of Session - 06/21/19 1234    Visit Number  28    Number of Visits  37    Date for PT Re-Evaluation  09/06/19    PT Start Time  1234    PT Stop Time  1315    PT Time Calculation (min)  41 min    Activity Tolerance  Patient tolerated treatment well;Patient limited by pain    Behavior During Therapy  [facility] for tasks assessed/performed       Past Medical History:  Diagnosis Date  . Pneumonia   . PONV (postoperative nausea and vomiting)   . Vision abnormalities     Past Surgical History:  Procedure Laterality Date  . APPENDECTOMY    . BREAST CYST ASPIRATION Left   . HIP ARTHROSCOPY    . MASS EXCISION Left 02/10/2019   Procedure: EXCISION OF LEFT BREAST MASS;  Surgeon: Stark Klein, MD;  Location: [place];  Service: General;  Laterality: Left;    There were no vitals filed for this visit.  Subjective Assessment - 06/21/19 1235    Subjective  Pt states she just feels heavy pulling on upper arm from the axillary region.  Before treatment she had the following ROM with pain - Lt shoulder Abd 129; Flexion 154    Pertinent History  Pt has removed of benign breast mass on 51/11/6158 with complication of seroma and clogged drain. She is fearful it may be getting infected and has just started an antibiotic, but doesn't know if she will be able to tolerate it. She has been wearing her post op binder with added compression. Her drain site has just stopped draining.  She can feel sharp pain from her drain site to her nipple when the seroma starts to fill up She is concerned pt tends to scar down    Patient Stated Goals  To get her arm back  to normal    Currently in Pain?  No/denies         [facility] PT Assessment - 06/21/19 0001      AROM   Left Shoulder Flexion  154 Degrees   155 end of session   Left Shoulder ABduction  129 Degrees   140 end of session                  [facility] PT Treatment/Exercise - 06/21/19 0001      Manual Therapy   Joint Mobilization  mobs to [facility] joint A/P direction    Soft tissue mobilization  pin and stretch to corded tissue; oppositional movement with traction to cording; lat release,subscap, pecs, anterior deltoid release   160 flex and 130 abduction   Myofascial Release  IASTM along cording                  PT Long Term Goals - 06/14/19 0912      PT LONG TERM GOAL #2   Title  Pt will have 150 degrees of left shoulder flexion and abduction so that she can return to normal activities    Baseline  averaging about 120 deg of abduction and 145 deg of flexion at this time    Time  12    Period  Weeks    Status  On-going    Target Date  09/06/19      PT LONG TERM GOAL #3   Title  Pt will report she is 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  IASTM relieves the cord aching at the end of the treatments session.  Tensile stretches applied oppositionally and with rotation both ways to corded tissues done manually today.  Pt had improved ROM from previous session and started with 129 deg abduction and ending with 140 degrees which is the most ROM thus far.  Pt will have  surgical release done to tissue cording and follow up next to ensure tissues are healing with maximized functional ROM in mind.    Comorbidities  breast surgery with seroma. 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    Consulted and Agree with Plan of Care  Patient       Patient will benefit from skilled therapeutic intervention in order to improve the following deficits and impairments:  Pain, Impaired UE functional use, Impaired flexibility, Increased fascial restricitons, Decreased strength, Decreased knowledge of use of DME, Decreased range of motion, Decreased scar mobility, Increased edema, Impaired perceived functional ability, Decreased skin integrity, Postural dysfunction  Visit Diagnosis: Muscle weakness (generalized)  Aftercare following surgery for neoplasm  Acute pain of left shoulder  Stiffness of left shoulder, not elsewhere classified     Problem List Patient Active Problem List   Diagnosis Date Noted  . Numbness 02/04/2016  . Cognitive changes 02/04/2016  . Other fatigue 02/04/2016    Jule Ser, PT 06/21/2019, 2:34 PM  [facility] [address], [place], [place], [address] Phone: [phone]   Fax:  [phone]  Name: Sheena Jarvis MRN: [id number] Date of Birth:  1970-12-03  PHYSICAL THERAPY DISCHARGE SUMMARY  Visits from Start of Care: 28  Current functional level related to goals / functional outcomes: See above goals   Remaining deficits: See above   Education / Equipment: HEP and self directed Plan: Patient agrees to discharge.  Patient goals were not met. Patient is being discharged due to a change in medical status.  ?????    Had surgery and returned for lymphatic issues post  surgery  Gustavus Bryant, PT 11/09/19 12:52 PM

## 2019-06-21 NOTE — H&P (Signed)
Jarome Lamas Location: [facility] Patient #: [id number] DOB: 1970/11/06 Married / Language: English / Race: White Female   History of Present Illness  The patient is a 49 year old female who presents for a follow-up for Breast problems. Patient is a 49 year old female who is referred for consultation by Dr. Purcell Nails for a left breast mass. Patient has felt prominent area of breast tissue in the axillary tail of her left breast for around 20 years. Mammograms have always been normal other than showing very dense breast tissue, so she did not think much of it. However over the last 6 months she has noted significant increase in size of this area. It has become much more mass-like and extends under the pec. She also has noted that it is become much more tender. She has gone from a C /D cup to a double D cup on that side. Her bra is poorly fitting at this point. She does not drink any caffeine. She eats minimal chocolate and does not drink black tea. She is not taking hormone replacement. She does have multiple members on her maternal grandmother side had breast cancer including her maternal grandmother. She is not sure how old they were but knows that they were not extremely young.   Pt underwent excision of left breast mass 02/10/2019. The mass was quite adherent to the back of the pec and the anterior latissimus.   She developed significant swelling on 02/18/19 and had 100-110 cc of fluid drained from an axillary seroma. I placed a seroma catheter on 02/19/19 and immediately drained 100 cc. the bulb was collecting about 50 cc a day, but it had significant issues getting clogged. She saw Puja twice who was able to get it to work. However, the fluid started draining around the catheter. we removed it. It immediately drained quite a bit and drained more over the next few days. The drain was replaced and quit working quickly again. Puja was able to troubleshoot it again, but  it stopped draining again and became clogged. It was removed once again and reaccumulated. She developed some clear drainage from her nipple.  I placed a wick in the incision and this allowed the seroma to continue draining to completion.   unfortunately over the last 8 weeks or so she has developed significant cording in her axilla. She has done aggressive PT appropriate for this including stretching, ROM exercises, strengthening, dry needling, massage, myofascial tx, and more for around 1 hour per day. She still only has around 120 degrees of abduction in her arm. She has gotten up to 137 degrees of abduction very briefly, but this quickly tightens back up. She also has numbness in her thumb and index finger, especially when she tries to stretch too far.      Allergies Doxycycline Hyclate *Tetracyclines**  Abdominal pain, Nausea, Headache. 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 Dramatic cording in left axilla extending into arm. The incision is not tethered to the cord, but two areas ABOVE the incision are very tightly tethered. The cord is around 1 cm in diameter. ROM restricted to around 120 degrees. Can be passively range  to around 130, but numbness develops at that point.  cord is midway between pec and lat borders. Cord extends around 4 cm below incision into the lateral breast. It goes around 1/3 down the upper arm. no LAD is palpable.     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 She has not had any sustained improvement in her ROM and is having significant neuropathic symptoms from the resulting shoulder dysmobility.  Will plan contracture release at the tethered site. Will avoid significant dissection given the level of seroma she got last time. Will excise the small amount of skin and divide the tight band of contracture. I would plan to close with interrupted sutures in order to allow drainage.  Discussed risks. Main ones I am concerned about are lymphedema of the breast and chance that this will not work to release contracture fully. 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  You should hear from our office's scheduling department within 5 working days about the location, date, and time of surgery. We try to make accommodations for patient's preferences in scheduling surgery, but sometimes the OR schedule or the surgeon's schedule prevents [place] from making those accommodations.  If you have not heard from our office [phone]) in 5 working days, call the office and ask for your surgeon's nurse.  If you have other questions about your diagnosis, plan, or surgery, call the office and ask for your surgeon's nurse.  DENSE BREASTS (R92.2) Impression: Annual MRI FAMILY HISTORY OF BREAST CANCER (Z80.3) Impression: see above.

## 2019-06-22 ENCOUNTER — Other Ambulatory Visit: Payer: Self-pay

## 2019-06-22 ENCOUNTER — Encounter (HOSPITAL_BASED_OUTPATIENT_CLINIC_OR_DEPARTMENT_OTHER)
Admission: RE | Disposition: A | Discharge status: Home / Self Care | Payer: Self-pay | Source: Ambulatory Visit | Attending: General Surgery

## 2019-06-22 ENCOUNTER — Encounter (HOSPITAL_BASED_OUTPATIENT_CLINIC_OR_DEPARTMENT_OTHER): Payer: Self-pay | Admitting: General Surgery

## 2019-06-22 ENCOUNTER — Ambulatory Visit (HOSPITAL_BASED_OUTPATIENT_CLINIC_OR_DEPARTMENT_OTHER): Payer: Federal, State, Local not specified - PPO | Admitting: Anesthesiology

## 2019-06-22 ENCOUNTER — Ambulatory Visit (HOSPITAL_BASED_OUTPATIENT_CLINIC_OR_DEPARTMENT_OTHER)
Admission: RE | Admit: 2019-06-22 | Discharge: 2019-06-22 | Disposition: A | Discharge status: Home / Self Care | Payer: Federal, State, Local not specified - PPO | Source: Ambulatory Visit | Attending: General Surgery | Admitting: General Surgery

## 2019-06-22 DIAGNOSIS — N6332 Unspecified lump in axillary tail of the left breast: Secondary | ICD-10-CM | POA: Diagnosis not present

## 2019-06-22 DIAGNOSIS — Z803 Family history of malignant neoplasm of breast: Secondary | ICD-10-CM | POA: Insufficient documentation

## 2019-06-22 DIAGNOSIS — M2450 Contracture, unspecified joint: Secondary | ICD-10-CM | POA: Diagnosis not present

## 2019-06-22 DIAGNOSIS — N6320 Unspecified lump in the left breast, unspecified quadrant: Secondary | ICD-10-CM | POA: Diagnosis not present

## 2019-06-22 DIAGNOSIS — M24512 Contracture, left shoulder: Secondary | ICD-10-CM | POA: Diagnosis not present

## 2019-06-22 DIAGNOSIS — M6248 Contracture of muscle, other site: Secondary | ICD-10-CM | POA: Diagnosis not present

## 2019-06-22 HISTORY — DX: Other specified postprocedural states: Z98.890

## 2019-06-22 HISTORY — DX: Other specified postprocedural states: R11.2

## 2019-06-22 HISTORY — PX: BREAST EXCISIONAL BIOPSY: SUR124

## 2019-06-22 HISTORY — PX: SCAR REVISION: SHX5285

## 2019-06-22 SURGERY — REVISION, SCAR
Anesthesia: Monitor Anesthesia Care | Site: Axilla | Laterality: Left

## 2019-06-22 MED ORDER — ENSURE PRE-SURGERY PO LIQD: 296.0000 mL | Freq: Once | ORAL | Status: DC

## 2019-06-22 MED ORDER — EPHEDRINE SULFATE 50 MG/ML IJ SOLN
INTRAMUSCULAR | Status: DC | PRN
  Administered 2019-06-22: 10 mg via INTRAVENOUS

## 2019-06-22 MED ORDER — CHLORHEXIDINE GLUCONATE CLOTH 2 % EX PADS: 6.0000 | MEDICATED_PAD | Freq: Once | CUTANEOUS | Status: DC

## 2019-06-22 MED ORDER — ACETAMINOPHEN 500 MG PO TABS
ORAL_TABLET | ORAL | Status: AC
  Filled 2019-06-22: qty 2

## 2019-06-22 MED ORDER — BUPIVACAINE HCL (PF) 0.25 % IJ SOLN
INTRAMUSCULAR | Status: DC | PRN
  Administered 2019-06-22: 6 mL

## 2019-06-22 MED ORDER — FENTANYL CITRATE (PF) 100 MCG/2ML IJ SOLN
INTRAMUSCULAR | Status: DC | PRN
  Administered 2019-06-22 (×2): 50 ug via INTRAVENOUS

## 2019-06-22 MED ORDER — CIPROFLOXACIN IN D5W 400 MG/200ML IV SOLN
INTRAVENOUS | Status: AC
  Filled 2019-06-22: qty 200

## 2019-06-22 MED ORDER — ACETAMINOPHEN 500 MG PO TABS: 1000.0000 mg | ORAL_TABLET | ORAL | Status: DC

## 2019-06-22 MED ORDER — LACTATED RINGERS IV SOLN: INTRAVENOUS | Status: DC

## 2019-06-22 MED ORDER — ONDANSETRON HCL 4 MG/2ML IJ SOLN
INTRAMUSCULAR | Status: AC
  Filled 2019-06-22: qty 2

## 2019-06-22 MED ORDER — LIDOCAINE-EPINEPHRINE 1 %-1:100000 IJ SOLN
INTRAMUSCULAR | Status: DC | PRN
  Administered 2019-06-22: 6 mL

## 2019-06-22 MED ORDER — ONDANSETRON HCL 4 MG/2ML IJ SOLN
INTRAMUSCULAR | Status: DC | PRN
  Administered 2019-06-22: 4 mg via INTRAVENOUS

## 2019-06-22 MED ORDER — FENTANYL CITRATE (PF) 100 MCG/2ML IJ SOLN: 50.0000 ug | INTRAMUSCULAR | Status: DC | PRN

## 2019-06-22 MED ORDER — PROPOFOL 500 MG/50ML IV EMUL
INTRAVENOUS | Status: DC | PRN
  Administered 2019-06-22: 25 ug/kg/min via INTRAVENOUS

## 2019-06-22 MED ORDER — ACETAMINOPHEN 500 MG PO TABS
1000.0000 mg | ORAL_TABLET | Freq: Once | ORAL | Status: AC
  Administered 2019-06-22: 1000 mg via ORAL

## 2019-06-22 MED ORDER — FENTANYL CITRATE (PF) 100 MCG/2ML IJ SOLN
INTRAMUSCULAR | Status: AC
  Filled 2019-06-22: qty 2

## 2019-06-22 MED ORDER — MIDAZOLAM HCL 2 MG/2ML IJ SOLN
INTRAMUSCULAR | Status: AC
  Filled 2019-06-22: qty 2

## 2019-06-22 MED ORDER — FENTANYL CITRATE (PF) 100 MCG/2ML IJ SOLN: 25.0000 ug | INTRAMUSCULAR | Status: DC | PRN

## 2019-06-22 MED ORDER — MIDAZOLAM HCL 5 MG/5ML IJ SOLN
INTRAMUSCULAR | Status: DC | PRN
  Administered 2019-06-22: 2 mg via INTRAVENOUS

## 2019-06-22 MED ORDER — LIDOCAINE 2% (20 MG/ML) 5 ML SYRINGE
INTRAMUSCULAR | Status: AC
  Filled 2019-06-22: qty 5

## 2019-06-22 MED ORDER — MIDAZOLAM HCL 2 MG/2ML IJ SOLN: 1.0000 mg | INTRAMUSCULAR | Status: DC | PRN

## 2019-06-22 MED ORDER — CIPROFLOXACIN IN D5W 400 MG/200ML IV SOLN
400.0000 mg | INTRAVENOUS | Status: AC
  Administered 2019-06-22: 400 mg via INTRAVENOUS

## 2019-06-22 SURGICAL SUPPLY — 50 items
BLADE HEX COATED 2.75 (ELECTRODE) ×2 IMPLANT
BLADE SURG 15 STRL LF DISP TIS (BLADE) ×1 IMPLANT
BLADE SURG 15 STRL SS (BLADE) ×1
BNDG COHESIVE 4X5 TAN STRL (GAUZE/BANDAGES/DRESSINGS) ×2 IMPLANT
CANISTER SUCT 1200ML W/VALVE (MISCELLANEOUS) IMPLANT
CHLORAPREP W/TINT 26 (MISCELLANEOUS) ×2 IMPLANT
COVER MAYO STAND STRL (DRAPES) ×2 IMPLANT
DECANTER SPIKE VIAL GLASS SM (MISCELLANEOUS) IMPLANT
DERMABOND ADVANCED (GAUZE/BANDAGES/DRESSINGS) ×1
DERMABOND ADVANCED .7 DNX12 (GAUZE/BANDAGES/DRESSINGS) ×1 IMPLANT
DRAPE UTILITY XL STRL (DRAPES) ×2 IMPLANT
DRSG TEGADERM 2-3/8X2-3/4 SM (GAUZE/BANDAGES/DRESSINGS) ×2 IMPLANT
ELECT REM PT RETURN 9FT ADLT (ELECTROSURGICAL) ×2
ELECTRODE REM PT RTRN 9FT ADLT (ELECTROSURGICAL) ×1 IMPLANT
GLOVE BIOGEL PI IND STRL 6.5 (GLOVE) ×1 IMPLANT
GLOVE BIOGEL PI IND STRL 7.0 (GLOVE) ×2 IMPLANT
GLOVE BIOGEL PI INDICATOR 6.5 (GLOVE) ×1
GLOVE BIOGEL PI INDICATOR 7.0 (GLOVE) ×2
GLOVE SURG SS PI 6.0 STRL IVOR (GLOVE) ×2 IMPLANT
GLOVE SURG SS PI 6.5 STRL IVOR (GLOVE) ×2 IMPLANT
GOWN STRL REUS W/ TWL LRG LVL3 (GOWN DISPOSABLE) ×1 IMPLANT
GOWN STRL REUS W/TWL 2XL LVL3 (GOWN DISPOSABLE) ×2 IMPLANT
GOWN STRL REUS W/TWL LRG LVL3 (GOWN DISPOSABLE) ×1
NDL SAFETY ECLIPSE 18X1.5 (NEEDLE) IMPLANT
NEEDLE HYPO 18GX1.5 SHARP (NEEDLE)
NEEDLE HYPO 25X1 1.5 SAFETY (NEEDLE) ×2 IMPLANT
NS IRRIG 1000ML POUR BTL (IV SOLUTION) ×2 IMPLANT
PACK BASIN DAY SURGERY FS (CUSTOM PROCEDURE TRAY) ×2 IMPLANT
PACK UNIVERSAL I (CUSTOM PROCEDURE TRAY) ×2 IMPLANT
PENCIL SMOKE EVACUATOR (MISCELLANEOUS) ×2 IMPLANT
SLEEVE SCD COMPRESS KNEE MED (MISCELLANEOUS) IMPLANT
SPONGE GAUZE 2X2 8PLY STRL LF (GAUZE/BANDAGES/DRESSINGS) ×2 IMPLANT
SPONGE LAP 18X18 RF (DISPOSABLE) ×2 IMPLANT
STAPLER VISISTAT 35W (STAPLE) IMPLANT
STOCKINETTE IMPERVIOUS LG (DRAPES) ×2 IMPLANT
STRIP CLOSURE SKIN 1/2X4 (GAUZE/BANDAGES/DRESSINGS) ×2 IMPLANT
SUT ETHILON 2 0 FS 18 (SUTURE) IMPLANT
SUT MNCRL AB 4-0 PS2 18 (SUTURE) ×2 IMPLANT
SUT SILK 2 0 SH (SUTURE) IMPLANT
SUT SILK 3 0 TIES 17X18 (SUTURE)
SUT SILK 3-0 18XBRD TIE BLK (SUTURE) IMPLANT
SUT VIC AB 2-0 SH 27 (SUTURE)
SUT VIC AB 2-0 SH 27XBRD (SUTURE) IMPLANT
SUT VIC AB 3-0 SH 27 (SUTURE) ×1
SUT VIC AB 3-0 SH 27X BRD (SUTURE) ×1 IMPLANT
SUT VICRYL 4-0 PS2 18IN ABS (SUTURE) ×2 IMPLANT
SYR CONTROL 10ML LL (SYRINGE) ×2 IMPLANT
TOWEL GREEN STERILE FF (TOWEL DISPOSABLE) ×2 IMPLANT
TUBE CONNECTING 20X1/4 (TUBING) ×2 IMPLANT
YANKAUER SUCT BULB TIP NO VENT (SUCTIONS) ×2 IMPLANT

## 2019-06-22 NOTE — Op Note (Signed)
PRE-OPERATIVE DIAGNOSIS: contracture left axilla after excision of left breast mass in axillary tail.  Unresponsive to aggressive physical therapy x 4 months.  POST-OPERATIVE DIAGNOSIS:  Same  PROCEDURE:  Procedure(s): Division and resection of a portion of the left axillary contracture  SURGEON:  Surgeon(s): Stark Klein, MD  ANESTHESIA:   local and MAC  DRAINS: none   LOCAL MEDICATIONS USED:  BUPIVICAINE  and LIDOCAINE   SPECIMEN:  Source of Specimen:  left axillary contracture  DISPOSITION OF SPECIMEN:  PATHOLOGY  COUNTS:  YES  DICTATION: .Dragon Dictation  PLAN OF CARE: Discharge to home after PACU  PATIENT DISPOSITION:  PACU - hemodynamically stable.  FINDINGS:  Tight contracture that appeared to be dense fibrous tissue  EBL: min  PROCEDURE:  Patient was identified in the holding area.  She was taken back to the operating room and placed supine on the operating room table.  MAC anesthesia was induced.  A timeout was performed according to the surgical safety checklist.  When all was correct, we continued.  The left axillary contracture was identified.  The skin at the most tender portion of the contracture was identified and local anesthetic was administered there.  A 1.5 cm incision was made at the site of the superficial contracture.  The subcutaneous tissues were divided sharply.  The contracture was identified and was divided with a 15 blade.  The portion of the contracture was elevated with an Allis.  Sharp Metzenbaum scissors were then used to divide distally.  A segment of approximately a centimeter and a half was removed.  The tissue was much softer at that point.  Hemostasis was achieved with cautery.  Two interrupted 4-0 Monocryl sutures were placed in the incision.  The incision was then dressed with Steri-Strips, gauze, and Tegaderm.  The patient was allowed to emerge from anesthesia and was taken to the PACU in stable condition.  Needle, sponge, and instrument  counts were correct x2.

## 2019-06-22 NOTE — Transfer of Care (Signed)
Immediate Anesthesia Transfer of Care Note  Patient: Sharla Gracy Racer  Procedure(s) Performed: LEFT AXILLARY CONTRACTURE RELEASE (Left Axilla)  Patient Location: PACU  Anesthesia Type:MAC  Level of Consciousness: awake and patient cooperative  Airway & Oxygen Therapy: Patient Spontanous Breathing and Patient connected to face mask oxygen  Post-op Assessment: Report given to RN and Post -op Vital signs reviewed and stable  Post vital signs: Reviewed and stable  Last Vitals:  Vitals Value Taken Time  BP    Temp    Pulse 68 06/22/19 1411  Resp 10 06/22/19 1411  SpO2 100 % 06/22/19 1411  Vitals shown include unvalidated device data.  Last Pain:  Vitals:   06/22/19 1137  TempSrc: Oral  PainSc: 2       Patients Stated Pain Goal: 4 (24/49/75 3005)  Complications: No apparent anesthesia complications

## 2019-06-22 NOTE — Discharge Instructions (Addendum)
[facility] Office Phone Number [phone]   POST OP INSTRUCTIONS  Always review your discharge instruction sheet given to you by the facility where your surgery was performed.  IF YOU HAVE DISABILITY OR FAMILY LEAVE FORMS, YOU MUST BRING THEM TO THE OFFICE FOR PROCESSING.  DO NOT GIVE THEM TO YOUR DOCTOR.  1. A prescription for pain medication may be given to you upon discharge.  Take your pain medication as prescribed, if needed.  If narcotic pain medicine is not needed, then you may take acetaminophen (Tylenol) or ibuprofen (Advil) as needed. 2. Take your usually prescribed medications unless otherwise directed 3. If you need a refill on your pain medication, please contact your pharmacy.  They will contact our office to request authorization.  Prescriptions will not be filled after 5pm or on week-ends. 4. You should eat very light the first 24 hours after surgery, such as soup, crackers, pudding, etc.  Resume your normal diet the day after surgery 5. It is common to experience some constipation if taking pain medication after surgery.  Increasing fluid intake and taking a stool softener will usually help or prevent this problem from occurring.  A mild laxative (Milk of Magnesia or Miralax) should be taken according to package directions if there are no bowel movements after 48 hours. 6. You may shower in 48 hours.  The surgical glue will flake off in 2-3 weeks.   7. ACTIVITIES:  No strenuous activity or heavy lifting for 1 week.   a. You may drive when you no longer are taking prescription pain medication, you can comfortably wear a seatbelt, and you can safely maneuver your car and apply brakes. b. RETURN TO WORK:  __________as tolerated as long as no lifting over 5-10 pounds for 1 week.  _______________ Bonita Quin should see your doctor in the office for a follow-up appointment approximately three-four weeks after your surgery.    WHEN TO CALL YOUR DOCTOR: 1. Fever over  101.0 2. Nausea and/or vomiting. 3. Extreme swelling or bruising. 4. Continued bleeding from incision. 5. Increased pain, redness, or drainage from the incision.  The clinic staff is available to answer your questions during regular business hours.  Please don't hesitate to call and ask to speak to one of the nurses for clinical concerns.  If you have a medical emergency, go to the nearest emergency room or call 911.  A surgeon from [facility] is always on call at the hospital.  For further questions, please visit [email or web address]    Post Anesthesia Home Care Instructions  Activity: Get plenty of rest for the remainder of the day. A responsible individual must stay with you for 24 hours following the procedure.  For the next 24 hours, DO NOT: -Drive a car -Advertising copywriter -Drink alcoholic beverages -Take any medication unless instructed by your physician -Make any legal decisions or sign important papers.  Meals: Start with liquid foods such as gelatin or soup. Progress to regular foods as tolerated. Avoid greasy, spicy, heavy foods. If nausea and/or vomiting occur, drink only clear liquids until the nausea and/or vomiting subsides. Call your physician if vomiting continues.  Special Instructions/Symptoms: Your throat may feel dry or sore from the anesthesia or the breathing tube placed in your throat during surgery. If this causes discomfort, gargle with warm salt water. The discomfort should disappear within 24 hours.  If you had a scopolamine patch placed behind your ear for the management of post- operative nausea and/or vomiting:  1. The  medication in the patch is effective for 72 hours, after which it should be removed.  Wrap patch in a tissue and discard in the trash. Wash hands thoroughly with soap and water. 2. You may remove the patch earlier than 72 hours if you experience unpleasant side effects which may include dry mouth, dizziness or visual  disturbances. 3. Avoid touching the patch. Wash your hands with soap and water after contact with the patch.    No tylenol until after 6pm today.

## 2019-06-22 NOTE — Anesthesia Preprocedure Evaluation (Addendum)
Anesthesia Evaluation  Patient identified by MRN, date of birth, ID band Patient awake    Reviewed: Allergy & Precautions, NPO status , Patient's Chart, lab work & pertinent test results  History of Anesthesia Complications (+) PONV and history of anesthetic complications  Airway Mallampati: II  TM Distance: >3 FB Neck ROM: Full  Mouth opening: Limited Mouth Opening  Dental no notable dental hx. (+) Teeth Intact, Dental Advisory Given   Pulmonary neg pulmonary ROS,    Pulmonary exam normal breath sounds clear to auscultation       Cardiovascular negative cardio ROS Normal cardiovascular exam Rhythm:Regular Rate:Normal     Neuro/Psych negative neurological ROS  negative psych ROS   GI/Hepatic negative GI ROS, Neg liver ROS,   Endo/Other  negative endocrine ROS  Renal/GU negative Renal ROS  negative genitourinary   Musculoskeletal negative musculoskeletal ROS (+)   Abdominal   Peds  Hematology negative hematology ROS (+)   Anesthesia Other Findings   Reproductive/Obstetrics                            Anesthesia Physical Anesthesia Plan  ASA: II  Anesthesia Plan: MAC   Post-op Pain Management:    Induction: Intravenous  PONV Risk Score and Plan: 3 and Propofol infusion, Treatment may vary due to age or medical condition, Midazolam, Ondansetron and Dexamethasone  Airway Management Planned: Natural Airway  Additional Equipment:   Intra-op Plan:   Post-operative Plan:   Informed Consent: I have reviewed the patients History and Physical, chart, labs and discussed the procedure including the risks, benefits and alternatives for the proposed anesthesia with the patient or authorized representative who has indicated his/her understanding and acceptance.     Dental advisory given  Plan Discussed with: CRNA  Anesthesia Plan Comments:         Anesthesia Quick Evaluation

## 2019-06-22 NOTE — Interval H&P Note (Signed)
History and Physical Interval Note:  06/22/2019 12:58 PM  Sheena Jarvis  has presented today for surgery, with the diagnosis of LEFT AXILLARY CONTRACTURE.  The various methods of treatment have been discussed with the patient and family. After consideration of risks, benefits and other options for treatment, the patient has consented to  Procedure(s): LEFT AXILLARY CONTRACTURE RELEASE (Left) as a surgical intervention.  The patient's history has been reviewed, patient examined, no change in status, stable for surgery.  I have reviewed the patient's chart and labs.  Questions were answered to the patient's satisfaction.     Almond Lint

## 2019-06-23 ENCOUNTER — Encounter: Payer: Self-pay | Admitting: *Deleted

## 2019-06-23 ENCOUNTER — Encounter: Payer: Self-pay | Admitting: Physical Therapy

## 2019-06-23 LAB — SURGICAL PATHOLOGY

## 2019-06-23 NOTE — Anesthesia Postprocedure Evaluation (Signed)
Anesthesia Post Note  Patient: Sheena Jarvis  Procedure(s) Performed: LEFT AXILLARY CONTRACTURE RELEASE (Left Axilla)     Patient location during evaluation: PACU Anesthesia Type: MAC Level of consciousness: awake and alert Pain management: pain level controlled Vital Signs Assessment: post-procedure vital signs reviewed and stable Respiratory status: spontaneous breathing, nonlabored ventilation, respiratory function stable and patient connected to nasal cannula oxygen Cardiovascular status: stable and blood pressure returned to baseline Postop Assessment: no apparent nausea or vomiting Anesthetic complications: no    Last Vitals:  Vitals:   06/22/19 1415 06/22/19 1429  BP: (!) 97/50 102/69  Pulse: 66   Resp: 12   Temp: 36.5 C 36.5 C  SpO2: 98%     Last Pain:  Vitals:   06/22/19 1429  TempSrc:   PainSc: 0-No pain   Pain Goal: Patients Stated Pain Goal: 4 (06/22/19 1137)                 Overly

## 2019-06-24 ENCOUNTER — Encounter: Payer: Self-pay | Admitting: Physical Therapy

## 2019-06-24 ENCOUNTER — Other Ambulatory Visit: Payer: Self-pay

## 2019-06-24 ENCOUNTER — Ambulatory Visit: Payer: Federal, State, Local not specified - PPO | Admitting: Physical Therapy

## 2019-06-24 DIAGNOSIS — Z483 Aftercare following surgery for neoplasm: Secondary | ICD-10-CM

## 2019-06-24 DIAGNOSIS — M25612 Stiffness of left shoulder, not elsewhere classified: Secondary | ICD-10-CM | POA: Diagnosis not present

## 2019-06-24 DIAGNOSIS — M25512 Pain in left shoulder: Secondary | ICD-10-CM

## 2019-06-24 DIAGNOSIS — M6281 Muscle weakness (generalized): Secondary | ICD-10-CM | POA: Diagnosis not present

## 2019-06-24 NOTE — Therapy (Signed)
[facility] [address] [place], [place], [address] Phone: [phone]   Fax:  [phone]  Physical Therapy Re-Evaluation   Patient Details  Name: Sheena Jarvis MRN: [id number] Date of Birth: April 11, 1971 Referring Provider (PT): Dr.Byerly    Encounter Date: 06/24/2019  PT End of Session - 06/24/19 1358    Visit Number  29   20 of these visits in 2020   Number of Visits  53    Date for PT Re-Evaluation  09/06/19    PT Start Time  1200    PT Stop Time  1245    PT Time Calculation (min)  45 min    Activity Tolerance  Patient tolerated treatment well    Behavior During Therapy  [facility] for tasks assessed/performed       Past Medical History:  Diagnosis Date  . Pneumonia   . PONV (postoperative nausea and vomiting)   . Vision abnormalities     Past Surgical History:  Procedure Laterality Date  . APPENDECTOMY    . BREAST CYST ASPIRATION Left   . HIP ARTHROSCOPY    . MASS EXCISION Left 02/10/2019   Procedure: EXCISION OF LEFT BREAST MASS;  Surgeon: Almond Lint, MD;  Location: [facility];  Service: General;  Laterality: Left;  . SCAR REVISION Left 06/22/2019   Procedure: LEFT AXILLARY CONTRACTURE RELEASE;  Surgeon: Almond Lint, MD;  Location: [facility];  Service: General;  Laterality: Left;    There were no vitals filed for this visit.  Subjective Assessment - 06/24/19 1202    Subjective  Pt had a release of the cord with a release of about 1.5 cm of tissue that included one node. She developed a seroma afterward and it is draining every 2 to 3 hours. She is able to express moderate amounts of clear/serosanguinous fluid from incisiioan    Pertinent History  Pt has removed of benign breast mass on 02/10/2019 with complication of seroma and clogged drain and possible infection.  She also developed lymphedema in her left breast and axillary cording.  She receieved extensive PT with MLD,  compression, manual therapy and dry needling, but had a significant persistent cord that was painful and limited shoulder ROM She underwent and incision of that contracted scar tissue on 06/22/2019 that included one node and develped a seroma after that.    Patient Stated Goals  To get her arm back to normal    Currently in Pain?  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       Cognition   Overall Cognitive Status  Within Functional Limits for tasks assessed      Observation/Other Assessments   Observations  recent small incision at left axilla that is approximated, compression marks on left breast from compression bra  Pt  comes in wearing lots of compression around her chest and axilla and had some visible venous congestion in left antecubital fossa a and upper arm  This was relieved after compression was removed     Skin Integrity  --      ROM / Strength   AROM / PROM / Strength  AROM      AROM   Overall AROM Comments  did not assess yet as precaution not to disturb healing lymphatic vessels     Right Shoulder Internal Rotation  --    Left Shoulder Flexion  --    Left Shoulder ABduction  --    Left Shoulder Internal Rotation  --      PROM   Overall PROM Comments  --      Strength   Left Shoulder Flexion  --    Left Shoulder ABduction  --    Left Shoulder Internal Rotation  --    Left Shoulder External Rotation  --    Left Shoulder Horizontal ABduction  --    Left Shoulder Horizontal ADduction   --      Palpation   Palpation comment  --      Special Tests    Special Tests  --    Rotator Cuff Impingment tests  --      Neer Impingement test    Findings  --    Comments  --      Hawkins-Kennedy test   Findings  --    Side  --    Comments  --      Empty Can test   Findings  --    Side  --    Comment  --      Full Can test   Findings  --    Side  --                   [facility] Adult PT Treatment/Exercise - 06/24/19 0001      Manual Therapy   Manual Therapy  Edema management;Manual Lymphatic Drainage (MLD)    Edema Management  thin strip allevyn to incision covered with small piece of folded tg soft a kinesiotape for pressure bandage     Manual Lymphatic Drainage (MLD)  performed in supine, short neck, superficial and deep abdominals, left inguinal nodes. left axillo-inguinal anastamosis, right axillar nodes, anterior interaxillary anastamosis, left breast and chest, left axilla, then to sidelying for posterior  interaxillary anastamosis and more work on axilla and lateral chest                   PT Long Term Goals - 06/24/19 1357      PT LONG TERM GOAL #1   Title  Pt will be independent in self MLD and use of compression to manage fullness in left upper quadrant    Time  8    Period  Weeks    Status  Achieved      PT LONG TERM GOAL #2   Title  Pt will have 150 degrees of left shoulder flexion and abduction so that she can return to normal activities    Time  8    Period  Weeks    Status  On-going      PT LONG TERM GOAL #3   Title  Pt will report she is able to do her job that involves manual work with both arms with no increase in pain or cording    Time  8  Period  Weeks    Status  On-going      PT LONG TERM GOAL #4   Title  Pt will have decrease in visual and palpable axillary cording by 50%  per photo and verbal report    Time  8    Period  Weeks    Status  On-going            Plan - 06/24/19 1351    Clinical Impression  Statement  Pt comes back to PT after a procedure to release cording scar that also included on lymph node.  She developed a seroma and is having active draining from incision.  MLD performed to chest and breast and pressure dressing with allevyn to wick the drainage, small tg soft path to area and kinesiotape for pressure to area promote sealing of the lymphatics. She may be ready to progress shouler ROM once the drainage is stopped. Gave her another chip pack to wear at axilla. Pt will monitor self for venous congestion in her arm.  Called [employer] to facilitate getting a compression bra and they said that she should have insurance coverage for them. Pt will be seen 3x a week for MLD and progressive ROM to shoulder. Recert send to Dr. Donell Beers    Personal Factors and Comorbidities  Comorbidity 2    Comorbidities  breast surgery with seroma. previous history of increased scarring after other surgeries    Examination-Activity Limitations  Reach Overhead    Stability/Clinical Decision Making  Stable/Uncomplicated    Clinical Decision Making  Low    Rehab Potential  Good    PT Frequency  3x / week    PT Duration  8 weeks    PT Treatment/Interventions  ADLs/Self Care Home Management;Therapeutic exercise;Orthotic Fit/Training;Patient/family education;Manual techniques;Manual lymph drainage;Passive range of motion;Scar mobilization;Taping;Functional mobility training;DME Instruction;Therapeutic activities;Neuromuscular re-education;Dry needling    PT Next Visit Plan  Assess seroma for continued drainage and effect of compression dressing.  Gently begin shoulder ROM if drainage is stopped and assue cording is reduced with manual techniques as needed . MLD as needed to left breast Check visit limit for 2021?????    Consulted and Agree with Plan of Care  Patient       Patient will benefit from skilled therapeutic intervention in order to improve the following deficits and impairments:  Pain, Impaired UE  functional use, Impaired flexibility, Increased fascial restricitons, Decreased strength, Decreased knowledge of use of DME, Decreased range of motion, Decreased scar mobility, Increased edema, Impaired perceived functional ability, Decreased skin integrity, Postural dysfunction  Visit Diagnosis: Aftercare following surgery for neoplasm - Plan: PT plan of care cert/re-cert  Stiffness of left shoulder, not elsewhere classified - Plan: PT plan of care cert/re-cert  Muscle weakness (generalized) - Plan: PT plan of care cert/re-cert  Acute pain of left shoulder - Plan: PT plan of care cert/re-cert     Problem List Patient Active Problem List   Diagnosis Date Noted  . Numbness 02/04/2016  . Cognitive changes 02/04/2016  . Other fatigue 02/04/2016   Bayard Hugger. Manson Passey PT  Donnetta Hail 06/24/2019, 2:05 PM  [facility] [address] [place], [place], [address] Phone: [phone]   Fax:  [phone]  Name: KIRSTI MCALPINE MRN: [id number] Date of Birth: 18-Jul-1970

## 2019-06-27 ENCOUNTER — Ambulatory Visit: Payer: Federal, State, Local not specified - PPO

## 2019-06-27 ENCOUNTER — Other Ambulatory Visit: Payer: Self-pay

## 2019-06-27 DIAGNOSIS — M25612 Stiffness of left shoulder, not elsewhere classified: Secondary | ICD-10-CM | POA: Diagnosis not present

## 2019-06-27 DIAGNOSIS — Z483 Aftercare following surgery for neoplasm: Secondary | ICD-10-CM | POA: Diagnosis not present

## 2019-06-27 DIAGNOSIS — M25512 Pain in left shoulder: Secondary | ICD-10-CM | POA: Diagnosis not present

## 2019-06-27 DIAGNOSIS — M6281 Muscle weakness (generalized): Secondary | ICD-10-CM

## 2019-06-27 NOTE — Therapy (Signed)
[facility] [address] [place], [place], [address] Phone: [phone]   Fax:  [phone]  Physical Therapy Treatment  Patient Details  Name: Sheena Jarvis MRN: [id number] Date of Birth: 1970-12-20 Referring Provider (PT): Dr.Byerly    Encounter Date: 06/27/2019  PT End of Session - 06/27/19 0804    Visit Number  30    Number of Visits  53    Date for PT Re-Evaluation  09/06/19    PT Start Time  0805    PT Stop Time  0858    PT Time Calculation (min)  53 min    Activity Tolerance  Patient tolerated treatment well    Behavior During Therapy  [facility] for tasks assessed/performed       Past Medical History:  Diagnosis Date  . Pneumonia   . PONV (postoperative nausea and vomiting)   . Vision abnormalities     Past Surgical History:  Procedure Laterality Date  . APPENDECTOMY    . BREAST CYST ASPIRATION Left   . HIP ARTHROSCOPY    . MASS EXCISION Left 02/10/2019   Procedure: EXCISION OF LEFT BREAST MASS;  Surgeon: Almond Lint, MD;  Location: [facility];  Service: General;  Laterality: Left;  . SCAR REVISION Left 06/22/2019   Procedure: LEFT AXILLARY CONTRACTURE RELEASE;  Surgeon: Almond Lint, MD;  [facility];  Service: General;  Laterality: Left;    There were no vitals filed for this visit.  Subjective Assessment - 06/27/19 0805    Subjective  Pt reports that her arm has been really sore over the weekend from her axilla to her wrist. She states that she has been wearing a swell spot folded in her armpit. She is concerned about moving her arm due to after her last surgery she had a lot of scarring and seroma.    Pertinent History  Pt has removed of benign breast mass on 02/10/2019 with complication of seroma and clogged drain and possible infection.  She also developed lymphedema in her left breast and axillary cording.  She receieved extensive PT with MLD, compression, manual  therapy and dry needling, but had a significant persistent cord that was painful and limited shoulder ROM She underwent and incision of that contracted scar tissue on 06/22/2019 that included one node and develped a seroma after that.    Patient Stated Goals  To get her arm back to normal    Pain Score  4     Pain Location  Arm    Pain Orientation  Left;Medial;Proximal    Pain Descriptors / Indicators  Aching    Pain Type  Surgical pain    Pain Radiating Towards  hand    Pain Frequency  Intermittent                       [facility] Adult PT Treatment/Exercise - 06/27/19 0001      Manual Therapy   Manual Therapy  Edema management;Manual Lymphatic Drainage (MLD)    Edema Management  1/2 inch gray foam for the lateral L breast with cut out for the nipple to prevent pressure in that area.     Manual Lymphatic Drainage (MLD)  In supine: MLD performed just to the L breast short neck, swimming in the terminus, Bil axillary and L inguinal nodes, anterior inter-axillary anastomosis, L axillo-inguinal anastomosis, superior L breast toward anteiror inter-axillary anastomosis then re-worked ansatomosis, inferior L breast toward L axillo-inguinal anastomosis then  re-worked anastomosis, re-worked all surfaces then deep abdominals. L arm was not performed today see assessment.              PT Education - 06/27/19 0900    Education Details  Extra time was spent today on education. Discussed not wearing any compression in the axilla at this time or on the LUE due to when pt lies down she begins to get significant distention in her superficial veins of her L UE from mid brachium down to the wrist. (MD was notified). Pt was educated to call the MD and will follow MD instructions. Discussed only performing MLD of the L breast at this time and not performing MLD on the LUE.    Person(s) Educated  Patient    Methods  Explanation    Comprehension  Verbalized understanding          PT Long  Term Goals - 06/24/19 1357      PT LONG TERM GOAL #1   Title  Pt will be independent in self MLD and use of compression to manage fullness in left upper quadrant    Time  8    Period  Weeks    Status  Achieved      PT LONG TERM GOAL #2   Title  Pt will have 150 degrees of left shoulder flexion and abduction so that she can return to normal activities    Time  8    Period  Weeks    Status  On-going      PT LONG TERM GOAL #3   Title  Pt will report she is able to do her job that involves manual work with both arms with no increase in pain or cording    Time  8    Period  Weeks    Status  On-going      PT LONG TERM GOAL #4   Title  Pt will have decrease in visual and palpable axillary cording by 50%  per photo and verbal report    Time  8    Period  Weeks    Status  On-going            Plan - 06/27/19 0804    Clinical Impression Statement  Pt presents to physical therapy today with her seroma covered with Allevyn. She reports that she drained it this morning and it is feeling much smaller than it felt this morning. Seroma feels soft; tissue in the L breast feels fibrotic at the lateral aspect inferior to superior and this is were pt reports the soreness is in her L breast. She was provided with 1/2 inch gray foam to cover this area to see if it helps clear fibrotic tissue. MLD was performed in supine to the L breast; as patient was lying down noted distention of superficial veins began from mid brachium down to the wrist. No MLD was performed on the LUE due to distention. MD was notified via message and spoke with Nurse Abigail Butts on the phone who stated they will call patient to come in for assessment. Pt was also educated to call MD if she does not receive a call. Extra time was spent on education this session for pt to not wear compression or perform MLD on the LUE until she is able to be cleared by MD. Pt will benefit from continued POC at this time unless otherwise specified by MD.     Personal Factors and Comorbidities  Comorbidity 2  Comorbidities  breast surgery with seroma. previous history of increased scarring after other surgeries    Examination-Activity Limitations  Reach Overhead    Stability/Clinical Decision Making  Stable/Uncomplicated    Rehab Potential  Good    PT Frequency  3x / week    PT Duration  8 weeks    PT Treatment/Interventions  ADLs/Self Care Home Management;Therapeutic exercise;Orthotic Fit/Training;Patient/family education;Manual techniques;Manual lymph drainage;Passive range of motion;Scar mobilization;Taping;Functional mobility training;DME Instruction;Therapeutic activities;Neuromuscular re-education;Dry needling    PT Next Visit Plan  how was foam? what did MD say? Assess seroma for continued drainage and effect of compression dressing.  Gently begin shoulder ROM if drainage is stopped and assue cording is reduced with manual techniques as needed . MLD as needed to left breast Check visit limit for 2021?????    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    Consulted and Agree with Plan of Care  Patient       Patient will benefit from skilled therapeutic intervention in order to improve the following deficits and impairments:  Pain, Impaired UE functional use, Impaired flexibility, Increased fascial restricitons, Decreased strength, Decreased knowledge of use of DME, Decreased range of motion, Decreased scar mobility, Increased edema, Impaired perceived functional ability, Decreased skin integrity, Postural dysfunction  Visit Diagnosis: Aftercare following surgery for neoplasm  Stiffness of left shoulder, not elsewhere classified  Muscle weakness (generalized)  Acute pain of left shoulder     Problem List Patient Active Problem List   Diagnosis Date Noted  . Numbness 02/04/2016  . Cognitive changes 02/04/2016  . Other fatigue 02/04/2016    Claudia Desanctis,  PT 06/27/2019, 9:10 AM  [facility] [address] [place], [place], [address] Phone: [phone]   Fax:  [phone]  Name: Sheena Jarvis MRN: [id number] Date of Birth: 1970-09-06

## 2019-06-28 ENCOUNTER — Ambulatory Visit: Payer: Federal, State, Local not specified - PPO

## 2019-06-28 ENCOUNTER — Encounter: Payer: Self-pay | Admitting: Physical Therapy

## 2019-06-28 DIAGNOSIS — M6281 Muscle weakness (generalized): Secondary | ICD-10-CM

## 2019-06-28 DIAGNOSIS — M25512 Pain in left shoulder: Secondary | ICD-10-CM | POA: Diagnosis not present

## 2019-06-28 DIAGNOSIS — M25612 Stiffness of left shoulder, not elsewhere classified: Secondary | ICD-10-CM | POA: Diagnosis not present

## 2019-06-28 DIAGNOSIS — Z483 Aftercare following surgery for neoplasm: Secondary | ICD-10-CM | POA: Diagnosis not present

## 2019-06-28 NOTE — Therapy (Signed)
[place], [place], [address] Phone: [phone]   Fax:  [phone]  Physical Therapy Treatment  Patient Details  Name: Sheena Jarvis MRN: [id number] Date of Birth: 05-Mar-1971 Referring Provider (PT): Dr.Byerly    Encounter Date: 06/28/2019  PT End of Session - 06/28/19 0936    Visit Number  31    Number of Visits  53    Date for PT Re-Evaluation  09/06/19    PT Start Time  0803    PT Stop Time  0900    PT Time Calculation (min)  57 min    Activity Tolerance  Patient tolerated treatment well    Behavior During Therapy  [facility] for tasks assessed/performed       Past Medical History:  Diagnosis Date  . Pneumonia   . PONV (postoperative nausea and vomiting)   . Vision abnormalities     Past Surgical History:  Procedure Laterality Date  . APPENDECTOMY    . BREAST CYST ASPIRATION Left   . HIP ARTHROSCOPY    . MASS EXCISION Left 02/10/2019   Procedure: EXCISION OF LEFT BREAST MASS;  Surgeon: Stark Klein, MD;  Location: [place];  Service: General;  Laterality: Left;  . SCAR REVISION Left 06/22/2019   Procedure: LEFT AXILLARY CONTRACTURE RELEASE;  Surgeon: Stark Klein, MD;  Location: [place];  Service: General;  Laterality: Left;    There were no vitals filed for this visit.  Subjective Assessment - 06/28/19 0806    Subjective  The pain in my Lt axilla got bad last night and then this morning a ton of fluid came out of the seroma. Dr. Barry Dienes said we'll "keep an eye on it until it gets worse".    Pertinent History  Pt has removed of benign breast mass on 77/12/2421 with complication of seroma and clogged drain and possible infection.  She also developed lymphedema in her left breast and axillary cording.  She receieved extensive PT with MLD, compression, manual therapy and dry needling, but had a significant persistent cord that was painful and limited shoulder ROM  She underwent and incision of that contracted scar tissue on 06/22/2019 that included one node and develped a seroma after that.    Patient Stated Goals  To get her arm back to normal    Currently in Pain?  Yes    Pain Score  3     Pain Location  Axilla    Pain Orientation  Left    Pain Descriptors / Indicators  Dull    Pain Type  Surgical pain    Pain Onset  More than a month ago    Pain Frequency  Intermittent    Aggravating Factors   overusing arm    Pain Relieving Factors  compression and MLD                       [facility] Adult PT Treatment/Exercise - 06/28/19 0001      Manual Therapy   Manual Therapy  Myofascial release;Manual Lymphatic Drainage (MLD);Passive ROM;Neural Stretch    Myofascial Release  Gently to Lt axilla during P/ROM    Manual Lymphatic Drainage (MLD)  In supine: MLD performed just to the L breast short neck, swimming in the terminus, Bil axillary and L inguinal nodes, anterior inter-axillary anastomosis, L axillo-inguinal anastomosis, superior L breast toward anteiror inter-axillary anastomosis then re-worked ansatomosis, inferior L breast toward L axillo-inguinal anastomosis then re-worked  anastomosis, then in to Rt S/L for furhter work along [employer] axillo-inguinal anastomosis and to posterior inter-axillary anastomosis, then finished in supine retracing all steps    Passive ROM  To Lt shoulder gently at end ranges of flexion, abduction and D2    Neural Stretch  To Lt UE during P/ROM                  PT Long Term Goals - 06/24/19 1357      PT LONG TERM GOAL #1   Title  Pt will be independent in self MLD and use of compression to manage fullness in left upper quadrant    Time  8    Period  Weeks    Status  Achieved      PT LONG TERM GOAL #2   Title  Pt will have 150 degrees of left shoulder flexion and abduction so that she can return to normal activities    Time  8    Period  Weeks    Status  On-going      PT LONG TERM GOAL #3   Title   Pt will report she is able to do her job that involves manual work with both arms with no increase in pain or cording    Time  8    Period  Weeks    Status  On-going      PT LONG TERM GOAL #4   Title  Pt will have decrease in visual and palpable axillary cording by 50%  per photo and verbal report    Time  8    Period  Weeks    Status  On-going            Plan - 06/28/19 8937    Clinical Impression Statement  Pt still keeping her seroma covered with an Allevyn bandage and reports alot fluid was expressed from this again this morning upon waking. She also had alot of pain at seroma last night that woke her up. Today she presents with no vascular distention in any position and pt reports pain was less today. Focused on manual therapy today including MLD along with gentle nyofascial release to Lt axilla during P/ROM. Pt reports this all felt very good and had no increaed pain at end of session.    Personal Factors and Comorbidities  Comorbidity 2    Comorbidities  breast surgery with seroma. previous history of increased scarring after other surgeries    Examination-Activity Limitations  Reach Overhead    Stability/Clinical Decision Making  Stable/Uncomplicated    Rehab Potential  Good    PT Frequency  3x / week    PT Duration  8 weeks    PT Treatment/Interventions  ADLs/Self Care Home Management;Therapeutic exercise;Orthotic Fit/Training;Patient/family education;Manual techniques;Manual lymph drainage;Passive range of motion;Scar mobilization;Taping;Functional mobility training;DME Instruction;Therapeutic activities;Neuromuscular re-education;Dry needling    PT Next Visit Plan  how was foam? Assess seroma for continued drainage and effect of compression dressing.  Assess tolerance of todays session and cont gentle shoulder ROM if drainage is stopped and assue cording is reduced with manual techniques as needed . MLD as needed to left breast Check visit limit for 2021?????    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    Consulted and Agree with Plan of Care  Patient       Patient will benefit from skilled therapeutic intervention in order to improve  the following deficits and impairments:  Pain, Impaired UE functional use, Impaired flexibility, Increased fascial restricitons, Decreased strength, Decreased knowledge of use of DME, Decreased range of motion, Decreased scar mobility, Increased edema, Impaired perceived functional ability, Decreased skin integrity, Postural dysfunction  Visit Diagnosis: Aftercare following surgery for neoplasm  Stiffness of left shoulder, not elsewhere classified  Muscle weakness (generalized)  Acute pain of left shoulder     Problem List Patient Active Problem List   Diagnosis Date Noted  . Numbness 02/04/2016  . Cognitive changes 02/04/2016  . Other fatigue 02/04/2016    Hermenia Bers, PTA 06/28/2019, 12:47 PM  [facility] [address] [place], [place], [address] Phone: [phone]   Fax:  [phone]  Name: Sheena Jarvis MRN: [id number] Date of Birth: 1970/12/21

## 2019-06-29 ENCOUNTER — Ambulatory Visit: Payer: Federal, State, Local not specified - PPO

## 2019-06-29 DIAGNOSIS — D242 Benign neoplasm of left breast: Secondary | ICD-10-CM | POA: Diagnosis not present

## 2019-07-01 ENCOUNTER — Ambulatory Visit: Payer: Federal, State, Local not specified - PPO

## 2019-07-01 ENCOUNTER — Other Ambulatory Visit: Payer: Self-pay

## 2019-07-01 DIAGNOSIS — M25512 Pain in left shoulder: Secondary | ICD-10-CM

## 2019-07-01 DIAGNOSIS — Z483 Aftercare following surgery for neoplasm: Secondary | ICD-10-CM

## 2019-07-01 DIAGNOSIS — M6281 Muscle weakness (generalized): Secondary | ICD-10-CM

## 2019-07-01 DIAGNOSIS — M25612 Stiffness of left shoulder, not elsewhere classified: Secondary | ICD-10-CM

## 2019-07-01 NOTE — Therapy (Signed)
[facility] [address] [place], [place], [address] Phone: [phone]   Fax:  [phone]  Physical Therapy Treatment  Patient Details  Name: Sheena Jarvis MRN: [id number] Date of Birth: [birth year] Referring Provider (PT): Dr.Byerly    Encounter Date: 07/01/2019  PT End of Session - 07/01/19 9767    Visit Number  32    Number of Visits  53    Date for PT Re-Evaluation  09/06/19    PT Start Time  0803    PT Stop Time  0900    PT Time Calculation (min)  57 min    Activity Tolerance  Patient tolerated treatment well    Behavior During Therapy  [facility] for tasks assessed/performed       Past Medical History:  Diagnosis Date  . Pneumonia   . PONV (postoperative nausea and vomiting)   . Vision abnormalities     Past Surgical History:  Procedure Laterality Date  . APPENDECTOMY    . BREAST CYST ASPIRATION Left   . HIP ARTHROSCOPY    . MASS EXCISION Left 02/10/2019   Procedure: EXCISION OF LEFT BREAST MASS;  Surgeon: Almond Lint, MD;  Location: [facility];  Service: General;  Laterality: Left;  . SCAR REVISION Left 06/22/2019   Procedure: LEFT AXILLARY CONTRACTURE RELEASE;  Surgeon: Almond Lint, MD;  Location: [facility];  Service: General;  Laterality: Left;    There were no vitals filed for this visit.  Subjective Assessment - 07/01/19 0812    Subjective  Pt reports that she started Cipro after her appointment on Monday 06/27/19 and started to feel better after 24 hours. She is not experiencing the same amount of aching.    Pertinent History  Pt has removed of benign breast mass on 02/10/2019 with complication of seroma and clogged drain and possible infection.  She also developed lymphedema in her left breast and axillary cording.  She receieved extensive PT with MLD, compression, manual therapy and dry needling, but had a significant persistent cord that was painful and limited shoulder ROM  She underwent and incision of that contracted scar tissue on 06/22/2019 that included one node and develped a seroma after that.    Patient Stated Goals  To get her arm back to normal    Currently in Pain?  Yes    Pain Score  2     Pain Location  Axilla    Pain Orientation  Left    Pain Descriptors / Indicators  Aching    Pain Type  Surgical pain                       [facility] Adult PT Treatment/Exercise - 07/01/19 0001      Manual Therapy   Manual Therapy  Myofascial release;Manual Lymphatic Drainage (MLD);Passive ROM;Neural Stretch    Soft tissue mobilization  STM to the pronator, wrist flexors; moderate improvement following STM    Myofascial Release  along the anterior antebrachium and brachium with pt in 30 degrees abd from axilla to wrist.     Manual Lymphatic Drainage (MLD)  In supine: MLD performed just to the L breast short neck, swimming in the terminus, Bil axillary and L inguinal nodes, anterior inter-axillary anastomosis, L axillo-inguinal anastomosis, superior L breast toward anteiror inter-axillary anastomosis then re-worked ansatomosis, inferior L breast toward L axillo-inguinal anastomosis then re-worked anastomosis, then in to Rt S/L for furhter work along [employer] axillo-inguinal anastomosis and to posterior  inter-axillary anastomosis, then finished in supine retracing all steps    Passive ROM  To Lt shoulder gently at end ranges of flexion, abduction              PT Education - 07/01/19 1200    Education Details  Pt will perform very light STM and myofascial release along her LUE at the wrist flexors and work on very light easy stretching of the wrist and LUE due to tightness noted this session.    Person(s) Educated  Patient    Methods  Explanation    Comprehension  Verbalized understanding          PT Long Term Goals - 06/24/19 1357      PT LONG TERM GOAL #1   Title  Pt will be independent in self MLD and use of compression to manage fullness in  left upper quadrant    Time  8    Period  Weeks    Status  Achieved      PT LONG TERM GOAL #2   Title  Pt will have 150 degrees of left shoulder flexion and abduction so that she can return to normal activities    Time  8    Period  Weeks    Status  On-going      PT LONG TERM GOAL #3   Title  Pt will report she is able to do her job that involves manual work with both arms with no increase in pain or cording    Time  8    Period  Weeks    Status  On-going      PT LONG TERM GOAL #4   Title  Pt will have decrease in visual and palpable axillary cording by 50%  per photo and verbal report    Time  8    Period  Weeks    Status  On-going            Plan - 07/01/19 9381    Clinical Impression Statement  Pt continues with distended veins in the L mid brachium and antebrachium to the wrist in supine with elbow extended; tried arm at ~100 degrees and with slight bend in the elbow that improved veinous distension. Myofascial release performed along the anterior antebrachium and brachium with extra time spent here due to noted adhesions especially near the antecubital fossa (superior/inferior). Palpable tightness/tenderness noted at the L pronator and flexor carpi radialis; decreaesed moderately following STM first at the pronator and then at the wrist flexor. Discussed performing wrist extension stretching in order to decrease fascial/muscular tightness at the L anterachium. MLD was performed throughout periodically in order to decrease risk for increased edema in this area before/after STM and myofascial release. Pt will benefit from continued POC at this time.    Personal Factors and Comorbidities  Comorbidity 2    Comorbidities  breast surgery with seroma. previous history of increased scarring after other surgeries    Examination-Activity Limitations  Reach Overhead    Rehab Potential  Good    PT Frequency  3x / week    PT Duration  8 weeks    PT Treatment/Interventions  ADLs/Self  Care Home Management;Therapeutic exercise;Orthotic Fit/Training;Patient/family education;Manual techniques;Manual lymph drainage;Passive range of motion;Scar mobilization;Taping;Functional mobility training;DME Instruction;Therapeutic activities;Neuromuscular re-education;Dry needling    PT Next Visit Plan  work on biceps and assess STM/myofascial release last session. Assess seroma for continued drainage and effect of compression dressing.  Assess tolerance of todays session and cont  gentle shoulder ROM if drainage is stopped and assue cording is reduced with manual techniques as needed . MLD as needed to left breast Check visit limit for 2021?????    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    Consulted and Agree with Plan of Care  Patient       Patient will benefit from skilled therapeutic intervention in order to improve the following deficits and impairments:  Pain, Impaired UE functional use, Impaired flexibility, Increased fascial restricitons, Decreased strength, Decreased knowledge of use of DME, Decreased range of motion, Decreased scar mobility, Increased edema, Impaired perceived functional ability, Decreased skin integrity, Postural dysfunction  Visit Diagnosis: Aftercare following surgery for neoplasm  Stiffness of left shoulder, not elsewhere classified  Muscle weakness (generalized)  Acute pain of left shoulder     Problem List Patient Active Problem List   Diagnosis Date Noted  . Numbness 02/04/2016  . Cognitive changes 02/04/2016  . Other fatigue 02/04/2016    Claudia Desanctis, PT 07/01/2019, 12:05 PM  [facility] [address] [place], [place], [address] Phone: [phone]   Fax:  [phone]  Name: NIKOLE SWARTZENTRUBER MRN: [id number] Date of Birth: 03-Feb-1971

## 2019-07-04 ENCOUNTER — Ambulatory Visit: Payer: Federal, State, Local not specified - PPO | Attending: General Surgery

## 2019-07-04 ENCOUNTER — Other Ambulatory Visit: Payer: Self-pay

## 2019-07-04 DIAGNOSIS — M6281 Muscle weakness (generalized): Secondary | ICD-10-CM | POA: Diagnosis not present

## 2019-07-04 DIAGNOSIS — M25512 Pain in left shoulder: Secondary | ICD-10-CM | POA: Insufficient documentation

## 2019-07-04 DIAGNOSIS — Z483 Aftercare following surgery for neoplasm: Secondary | ICD-10-CM | POA: Diagnosis not present

## 2019-07-04 DIAGNOSIS — M25612 Stiffness of left shoulder, not elsewhere classified: Secondary | ICD-10-CM | POA: Diagnosis not present

## 2019-07-04 NOTE — Therapy (Signed)
[facility] [address] [place], [place], [address] Phone: [phone]   Fax:  [phone]  Physical Therapy Treatment  Patient Details  Name: Sheena Jarvis MRN: [id number] Date of Birth: 11/15/1970 Referring Provider (PT): Dr.Byerly    Encounter Date: 07/04/2019  PT End of Session - 07/04/19 0808    Visit Number  33    Number of Visits  53    Date for PT Re-Evaluation  09/06/19    PT Start Time  0805    PT Stop Time  0905    PT Time Calculation (min)  60 min    Activity Tolerance  Patient tolerated treatment well    Behavior During Therapy  [facility] for tasks assessed/performed       Past Medical History:  Diagnosis Date  . Pneumonia   . PONV (postoperative nausea and vomiting)   . Vision abnormalities     Past Surgical History:  Procedure Laterality Date  . APPENDECTOMY    . BREAST CYST ASPIRATION Left   . HIP ARTHROSCOPY    . MASS EXCISION Left 02/10/2019   Procedure: EXCISION OF LEFT BREAST MASS;  Surgeon: Almond Lint, MD;  Location: [facility];  Service: General;  Laterality: Left;  . SCAR REVISION Left 06/22/2019   Procedure: LEFT AXILLARY CONTRACTURE RELEASE;  Surgeon: Almond Lint, MD;  Location: [facility];  Service: General;  Laterality: Left;    There were no vitals filed for this visit.  Subjective Assessment - 07/04/19 0809    Subjective  Pt states that she only got a little fluid out of her arm this morning and the fluid seems to be what causes her the most pain. She states that she feels worse without compression on and when her arm gets bumped.    Pertinent History  Pt has removed of benign breast mass on 02/10/2019 with complication of seroma and clogged drain and possible infection.  She also developed lymphedema in her left breast and axillary cording.  She receieved extensive PT with MLD, compression, manual therapy and dry needling, but had a significant persistent  cord that was painful and limited shoulder ROM She underwent and incision of that contracted scar tissue on 06/22/2019 that included one node and develped a seroma after that.    Patient Stated Goals  To get her arm back to normal    Currently in Pain?  Yes    Pain Score  1     Pain Location  Axilla    Pain Orientation  Left    Pain Descriptors / Indicators  Aching    Pain Type  Surgical pain    Pain Onset  More than a month ago    Aggravating Factors   if it gets bumped    Pain Relieving Factors  compression and MLD                       [facility] Adult PT Treatment/Exercise - 07/04/19 0001      Manual Therapy   Manual Therapy  Myofascial release;Manual Lymphatic Drainage (MLD);Passive ROM;Neural Stretch    Soft tissue mobilization  STM to the wrist flexors, biceps, anterior deltoid and triceps; moderate improvement following STM    Myofascial Release  along the anterior antebrachium and brachium with pt in 30 degrees abd from axilla to wrist.     Manual Lymphatic Drainage (MLD)  In supine: MLD performed just to the L breast short neck, swimming in  the terminus, Bil axillary and L inguinal nodes, anterior inter-axillary anastomosis, L axillo-inguinal anastomosis, superior L breast toward anteiror inter-axillary anastomosis then re-worked ansatomosis, inferior L breast toward L axillo-inguinal anastomosis then re-worked anastomosis, medial to lateral L brachium, lateral L brachium, re-worked all surfaces; deep abodminals    Passive ROM  To Lt shoulder gently at end ranges of flexion, abduction                   PT Long Term Goals - 06/24/19 1357      PT LONG TERM GOAL #1   Title  Pt will be independent in self MLD and use of compression to manage fullness in left upper quadrant    Time  8    Period  Weeks    Status  Achieved      PT LONG TERM GOAL #2   Title  Pt will have 150 degrees of left shoulder flexion and abduction so that she can return to normal  activities    Time  8    Period  Weeks    Status  On-going      PT LONG TERM GOAL #3   Title  Pt will report she is able to do her job that involves manual work with both arms with no increase in pain or cording    Time  8    Period  Weeks    Status  On-going      PT LONG TERM GOAL #4   Title  Pt will have decrease in visual and palpable axillary cording by 50%  per photo and verbal report    Time  8    Period  Weeks    Status  On-going            Plan - 07/04/19 0808    Clinical Impression Statement  Pt continues with distended veins in the L mid-brachium and antebrachium  without as much at the wrist in supine with elbow extended; it has improved since her last session. Continued with myofascial release at the L arm from antebrachium to axilla. STM this session with extra time spent at the biceps due to increased tightness/tenderness in this area. MLD was performed for the L breast following STM in order to decrease risk for edema and to move fliud away from incision area. Pt will benefit from continud POC at this time.    Personal Factors and Comorbidities  Comorbidity 2    Comorbidities  breast surgery with seroma. previous history of increased scarring after other surgeries    Examination-Activity Limitations  Reach Overhead    Rehab Potential  Good    PT Frequency  3x / week    PT Duration  8 weeks    PT Treatment/Interventions  ADLs/Self Care Home Management;Therapeutic exercise;Orthotic Fit/Training;Patient/family education;Manual techniques;Manual lymph drainage;Passive range of motion;Scar mobilization;Taping;Functional mobility training;DME Instruction;Therapeutic activities;Neuromuscular re-education;Dry needling    PT Next Visit Plan  work on biceps and assess STM/myofascial release last session. Assess seroma for continued drainage and effect of compression dressing.  Assess tolerance of todays session and cont gentle shoulder ROM if drainage is stopped and assue  cording is reduced with manual techniques as needed . MLD as needed to left breast Check visit limit for 2021?????    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    Consulted and Agree with Plan of Care  Patient  Patient will benefit from skilled therapeutic intervention in order to improve the following deficits and impairments:  Pain, Impaired UE functional use, Impaired flexibility, Increased fascial restricitons, Decreased strength, Decreased knowledge of use of DME, Decreased range of motion, Decreased scar mobility, Increased edema, Impaired perceived functional ability, Decreased skin integrity, Postural dysfunction  Visit Diagnosis: Aftercare following surgery for neoplasm  Stiffness of left shoulder, not elsewhere classified  Muscle weakness (generalized)  Acute pain of left shoulder     Problem List Patient Active Problem List   Diagnosis Date Noted  . Numbness 02/04/2016  . Cognitive changes 02/04/2016  . Other fatigue 02/04/2016    Claudia Desanctis, PT 07/04/2019, 10:11 AM  [facility] [address] [place], [place], [address] Phone: [phone]   Fax:  [phone]  Name: CARINNE BRANDENBURGER MRN: [id number] Date of Birth: [birth year]

## 2019-07-05 ENCOUNTER — Encounter: Payer: Self-pay | Admitting: Physical Therapy

## 2019-07-05 ENCOUNTER — Ambulatory Visit: Payer: Federal, State, Local not specified - PPO

## 2019-07-05 DIAGNOSIS — M25612 Stiffness of left shoulder, not elsewhere classified: Secondary | ICD-10-CM | POA: Diagnosis not present

## 2019-07-05 DIAGNOSIS — Z483 Aftercare following surgery for neoplasm: Secondary | ICD-10-CM | POA: Diagnosis not present

## 2019-07-05 DIAGNOSIS — M6281 Muscle weakness (generalized): Secondary | ICD-10-CM

## 2019-07-05 DIAGNOSIS — M25512 Pain in left shoulder: Secondary | ICD-10-CM

## 2019-07-05 NOTE — Therapy (Signed)
[facility] [address] [place], [place], [address] Phone: [phone]   Fax:  [phone]  Physical Therapy Treatment  Patient Details  Name: Sheena Jarvis MRN: [id number] Date of Birth: [birth year] Referring Provider (PT): Dr.Byerly    Encounter Date: 07/05/2019  PT End of Session - 07/05/19 0801    Visit Number  34    Number of Visits  53    Date for PT Re-Evaluation  09/06/19    PT Start Time  0802    PT Stop Time  0902    PT Time Calculation (min)  60 min    Activity Tolerance  Patient tolerated treatment well    Behavior During Therapy  [facility] for tasks assessed/performed       Past Medical History:  Diagnosis Date  . Pneumonia   . PONV (postoperative nausea and vomiting)   . Vision abnormalities     Past Surgical History:  Procedure Laterality Date  . APPENDECTOMY    . BREAST CYST ASPIRATION Left   . HIP ARTHROSCOPY    . MASS EXCISION Left 02/10/2019   Procedure: EXCISION OF LEFT BREAST MASS;  Surgeon: Almond Lint, MD;  Location: [facility];  Service: General;  Laterality: Left;  . SCAR REVISION Left 06/22/2019   Procedure: LEFT AXILLARY CONTRACTURE RELEASE;  Surgeon: Almond Lint, MD;  Location: [facility];  Service: General;  Laterality: Left;    There were no vitals filed for this visit.  Subjective Assessment - 07/05/19 0802    Subjective  Pt reports that with compression she has no pain at her breast when wearing compression. She does have some throbbing in her L arm. Pt has purchased a compression shirt that covers her chest and Bil axillary/brachial areas.    Pertinent History  Pt has removed of benign breast mass on 02/10/2019 with complication of seroma and clogged drain and possible infection.  She also developed lymphedema in her left breast and axillary cording.  She receieved extensive PT with MLD, compression, manual therapy and dry needling, but had a significant  persistent cord that was painful and limited shoulder ROM She underwent and incision of that contracted scar tissue on 06/22/2019 that included one node and develped a seroma after that.    Patient Stated Goals  To get her arm back to normal    Currently in Pain?  Yes    Pain Score  2     Pain Location  Axilla    Pain Orientation  Left    Pain Descriptors / Indicators  Aching    Pain Type  Surgical pain    Pain Radiating Towards  hand    Pain Onset  More than a month ago    Pain Frequency  Intermittent    Aggravating Factors   at rest    Pain Relieving Factors  compression and MLD    Effect of Pain on Daily Activities  limits work activities                       [facility] Adult PT Treatment/Exercise - 07/05/19 0001      Manual Therapy   Manual Therapy  Myofascial release;Manual Lymphatic Drainage (MLD);Passive ROM    Soft tissue mobilization  STM to the L biceps and anterior deltoid; significant decrease in tightness by end of session.     Myofascial Release  Along the L lateral breast avoiding tension on the incision in the L axilla.  Manual Lymphatic Drainage (MLD)  In supine: MLD performed just to L brachium, short neck, swimming in the terminus, Bil axillary and L inguinal nodes, anterior inter-axillary anastomosis, L axillo-inguinal anastomosis, medial to lateral L brachium, lateral L brachium, L shoulder, re-worked all surfaces; deep abdominals.     Passive ROM  To Lt shoulder gently at end ranges of flexion, abduction              PT Education - 07/05/19 0901    Education Details  Pt will continue with stretching, exercises and MLD at home while wearing compression throughout the day.    Person(s) Educated  Patient    Methods  Explanation    Comprehension  Verbalized understanding          PT Long Term Goals - 06/24/19 1357      PT LONG TERM GOAL #1   Title  Pt will be independent in self MLD and use of compression to manage fullness in left upper  quadrant    Time  8    Period  Weeks    Status  Achieved      PT LONG TERM GOAL #2   Title  Pt will have 150 degrees of left shoulder flexion and abduction so that she can return to normal activities    Time  8    Period  Weeks    Status  On-going      PT LONG TERM GOAL #3   Title  Pt will report she is able to do her job that involves manual work with both arms with no increase in pain or cording    Time  8    Period  Weeks    Status  On-going      PT LONG TERM GOAL #4   Title  Pt will have decrease in visual and palpable axillary cording by 50%  per photo and verbal report    Time  8    Period  Weeks    Status  On-going            Plan - 07/05/19 0801    Clinical Impression Statement  Pt has improvement in veinous distension on the L mid-brachium and antebrachium this session. Brief myofascial release on the lateral L breast avoiding tension on the incision. STM over the L biceps, anterior deltoid; extra time spent here with significant reduction in tightness/tenderness by end of session. MLD was performed following STM for the anterior trunk and into the L brachium in order to facilitate lymphatic flow and decrease risk of edema. Pt will benefit from continued POC at this time.    Personal Factors and Comorbidities  Comorbidity 2    Comorbidities  breast surgery with seroma. previous history of increased scarring after other surgeries    Examination-Activity Limitations  Reach Overhead    PT Frequency  3x / week    PT Duration  8 weeks    PT Treatment/Interventions  ADLs/Self Care Home Management;Therapeutic exercise;Orthotic Fit/Training;Patient/family education;Manual techniques;Manual lymph drainage;Passive range of motion;Scar mobilization;Taping;Functional mobility training;DME Instruction;Therapeutic activities;Neuromuscular re-education;Dry needling    PT Next Visit Plan  focus on MLDof the L breast, assess STM/myofascial release last session. Assess seroma for  continued drainage and effect of compression dressing.  Assess tolerance of todays session and cont gentle shoulder ROM if drainage is stopped and assue cording is reduced with manual techniques as needed . MLD as needed to left breast Check visit limit for 2021?????    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    Consulted and Agree with Plan of Care  Patient       Patient will benefit from skilled therapeutic intervention in order to improve the following deficits and impairments:  Pain, Impaired UE functional use, Impaired flexibility, Increased fascial restricitons, Decreased strength, Decreased knowledge of use of DME, Decreased range of motion, Decreased scar mobility, Increased edema, Impaired perceived functional ability, Decreased skin integrity, Postural dysfunction  Visit Diagnosis: Aftercare following surgery for neoplasm  Stiffness of left shoulder, not elsewhere classified  Muscle weakness (generalized)  Acute pain of left shoulder     Problem List Patient Active Problem List   Diagnosis Date Noted  . Numbness 02/04/2016  . Cognitive changes 02/04/2016  . Other fatigue 02/04/2016    Claudia Desanctis, PT 07/05/2019, 9:12 AM  [facility] [address] [place], [place], [address] Phone: [phone]   Fax:  [phone]  Name: Sheena Jarvis MRN: [id number] Date of Birth: 03-01-1971

## 2019-07-08 ENCOUNTER — Ambulatory Visit: Payer: Federal, State, Local not specified - PPO

## 2019-07-08 ENCOUNTER — Other Ambulatory Visit: Payer: Self-pay

## 2019-07-08 DIAGNOSIS — M25612 Stiffness of left shoulder, not elsewhere classified: Secondary | ICD-10-CM | POA: Diagnosis not present

## 2019-07-08 DIAGNOSIS — Z483 Aftercare following surgery for neoplasm: Secondary | ICD-10-CM | POA: Diagnosis not present

## 2019-07-08 DIAGNOSIS — M6281 Muscle weakness (generalized): Secondary | ICD-10-CM

## 2019-07-08 DIAGNOSIS — M25512 Pain in left shoulder: Secondary | ICD-10-CM

## 2019-07-08 NOTE — Therapy (Signed)
[facility] [address] [place], [place], [address] Phone: [phone]   Fax:  [phone]  Physical Therapy Treatment  Patient Details  Name: Sheena Jarvis MRN: [id number] Date of Birth: [birth year] Referring Provider (PT): Dr.Byerly    Encounter Date: 07/08/2019  PT End of Session - 07/08/19 0809    Visit Number  35    Number of Visits  53    Date for PT Re-Evaluation  09/06/19    PT Start Time  0801    PT Stop Time  0901    PT Time Calculation (min)  60 min    Activity Tolerance  Patient tolerated treatment well    Behavior During Therapy  [facility] for tasks assessed/performed       Past Medical History:  Diagnosis Date  . Pneumonia   . PONV (postoperative nausea and vomiting)   . Vision abnormalities     Past Surgical History:  Procedure Laterality Date  . APPENDECTOMY    . BREAST CYST ASPIRATION Left   . HIP ARTHROSCOPY    . MASS EXCISION Left 02/10/2019   Procedure: EXCISION OF LEFT BREAST MASS;  Surgeon: Almond Lint, MD;  Location: [facility];  Service: General;  Laterality: Left;  . SCAR REVISION Left 06/22/2019   Procedure: LEFT AXILLARY CONTRACTURE RELEASE;  Surgeon: Almond Lint, MD;  Location: [facility];  Service: General;  Laterality: Left;    There were no vitals filed for this visit.  Subjective Assessment - 07/08/19 0809    Subjective  Pt states that she worked over the past 2 days and just felt that she could not wear compression last night which increased her pain. She is still very sore in her L breast.    Pertinent History  Pt has removed of benign breast mass on 02/10/2019 with complication of seroma and clogged drain and possible infection.  She also developed lymphedema in her left breast and axillary cording.  She receieved extensive PT with MLD, compression, manual therapy and dry needling, but had a significant persistent cord that was painful and limited shoulder  ROM She underwent and incision of that contracted scar tissue on 06/22/2019 that included one node and develped a seroma after that.    Patient Stated Goals  To get her arm back to normal    Currently in Pain?  Yes    Pain Score  4     Pain Location  Breast    Pain Orientation  Left    Pain Descriptors / Indicators  Aching    Pain Type  Surgical pain    Pain Onset  More than a month ago    Pain Frequency  Intermittent    Aggravating Factors   at rest            LYMPHEDEMA/ONCOLOGY QUESTIONNAIRE - 07/08/19 0814      Right Upper Extremity Lymphedema   Other  27.5 6 cm from the chest wall in the center of the inferior portion      Left Upper Extremity Lymphedema   15 cm Proximal to Olecranon Process  23.2 cm    10 cm Proximal to Olecranon Process  22.7 cm    Olecranon Process  21 cm    15 cm Proximal to Ulnar Styloid Process  18.5 cm    Just Proximal to Ulnar Styloid Process  14.3 cm    Across Hand at [facility]  17.1 cm    At [place] of  2nd Digit  5.7 cm    At [employer] of Thumb  83 cm    Other  86.6    Other  35.5 6 cm from the chest all in mid inferior portion of the breast                [facility] Adult PT Treatment/Exercise - 07/08/19 0001      Moist Heat Therapy   Number Minutes Moist Heat  10 Minutes    Moist Heat Location  Other (comment)   axilla     Manual Therapy   Manual Therapy  Myofascial release;Manual Lymphatic Drainage (MLD)    Myofascial Release  In abduction following mild heat longitudinally in the L brachium and along the L lateral breast with visual improvement following myofascial release.     Manual Lymphatic Drainage (MLD)  In supine: MLD performed just to L brachium, short neck, swimming in the terminus, Bil axillary and L inguinal nodes, anterior inter-axillary anastomosis, L axillo-inguinal anastomosis, medial to lateral L brachium, lateral L brachium, L shoulder, re-worked all surfaces; deep abdominals.              PT Education -  07/08/19 1024    Education Details  Pt will continue with light stretching and MLD at home while wearing her compressoin sleeve due to noted swelling at the end of the day.    Person(s) Educated  Patient    Methods  Explanation    Comprehension  Verbalized understanding          PT Long Term Goals - 06/24/19 1357      PT LONG TERM GOAL #1   Title  Pt will be independent in self MLD and use of compression to manage fullness in left upper quadrant    Time  8    Period  Weeks    Status  Achieved      PT LONG TERM GOAL #2   Title  Pt will have 150 degrees of left shoulder flexion and abduction so that she can return to normal activities    Time  8    Period  Weeks    Status  On-going      PT LONG TERM GOAL #3   Title  Pt will report she is able to do her job that involves manual work with both arms with no increase in pain or cording    Time  8    Period  Weeks    Status  On-going      PT LONG TERM GOAL #4   Title  Pt will have decrease in visual and palpable axillary cording by 50%  per photo and verbal report    Time  8    Period  Weeks    Status  On-going            Plan - 07/08/19 0808    Clinical Impression Statement  Pt presents with thick cord in the L axilla that radiates from mid L medial brachium to the L lateral/medial breast; decreased following some warm heat and longitudinal myofascial release. MLD was performed for the L breast and L brachium following myofascial release and heat. Pt was educated on use of compression sleeve to prevent possible formation of lymphedema in the LUE du eto her reporting increased edema at the end of 2 work days. Discussed possible use of vaspneumatic pump; pt was agreeable. Pt will benefit from continued POC at this time.    Personal Factors and Comorbidities  Comorbidity 2    Comorbidities  breast surgery with seroma. previous history of increased scarring after other surgeries    Examination-Activity Limitations  Reach  Overhead    Rehab Potential  Good    PT Duration  8 weeks    PT Treatment/Interventions  ADLs/Self Care Home Management;Therapeutic exercise;Orthotic Fit/Training;Patient/family education;Manual techniques;Manual lymph drainage;Passive range of motion;Scar mobilization;Taping;Functional mobility training;DME Instruction;Therapeutic activities;Neuromuscular re-education;Dry needling    PT Next Visit Plan  focus on MLD of the L breast/arm, myofascial release for cording    PT Home Exercise Plan  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    Recommended Other Services  compression sleeve 28 days, 12 hours/day, compression pump    Consulted and Agree with Plan of Care  Patient       Patient will benefit from skilled therapeutic intervention in order to improve the following deficits and impairments:  Pain, Impaired UE functional use, Impaired flexibility, Increased fascial restricitons, Decreased strength, Decreased knowledge of use of DME, Decreased range of motion, Decreased scar mobility, Increased edema, Impaired perceived functional ability, Decreased skin integrity, Postural dysfunction  Visit Diagnosis: Aftercare following surgery for neoplasm  Stiffness of left shoulder, not elsewhere classified  Muscle weakness (generalized)  Acute pain of left shoulder     Problem List Patient Active Problem List   Diagnosis Date Noted  . Numbness 02/04/2016  . Cognitive changes 02/04/2016  . Other fatigue 02/04/2016    Ander Purpura, PT 07/08/2019, 10:37 AM  [facility] [place], [place], [address] Phone: [phone]   Fax:  [phone]  Name: BRIZEIDA MCMURRY MRN: [id number] Date of Birth: July 11, 1970

## 2019-07-11 ENCOUNTER — Ambulatory Visit: Payer: Federal, State, Local not specified - PPO

## 2019-07-11 ENCOUNTER — Other Ambulatory Visit: Payer: Self-pay

## 2019-07-11 DIAGNOSIS — M25612 Stiffness of left shoulder, not elsewhere classified: Secondary | ICD-10-CM

## 2019-07-11 DIAGNOSIS — M6281 Muscle weakness (generalized): Secondary | ICD-10-CM | POA: Diagnosis not present

## 2019-07-11 DIAGNOSIS — M25512 Pain in left shoulder: Secondary | ICD-10-CM | POA: Diagnosis not present

## 2019-07-11 DIAGNOSIS — Z483 Aftercare following surgery for neoplasm: Secondary | ICD-10-CM

## 2019-07-11 NOTE — Therapy (Signed)
[facility] [address] [place], [place], [address] Phone: [phone]   Fax:  [phone]  Physical Therapy Treatment  Patient Details  Name: Sheena Jarvis MRN: [id number] Date of Birth: 1970-12-04 Referring Provider (PT): Dr.Byerly    Encounter Date: 07/11/2019  PT End of Session - 07/11/19 0914    Visit Number  36    Number of Visits  53    Date for PT Re-Evaluation  09/06/19    PT Start Time  0910    PT Stop Time  1005    PT Time Calculation (min)  55 min    Activity Tolerance  Patient tolerated treatment well    Behavior During Therapy  [facility] for tasks assessed/performed       Past Medical History:  Diagnosis Date  . Pneumonia   . PONV (postoperative nausea and vomiting)   . Vision abnormalities     Past Surgical History:  Procedure Laterality Date  . APPENDECTOMY    . BREAST CYST ASPIRATION Left   . HIP ARTHROSCOPY    . MASS EXCISION Left 02/10/2019   Procedure: EXCISION OF LEFT BREAST MASS;  Surgeon: Almond Lint, MD;  Location: [facility];  Service: General;  Laterality: Left;  . SCAR REVISION Left 06/22/2019   Procedure: LEFT AXILLARY CONTRACTURE RELEASE;  Surgeon: Almond Lint, MD;  Location: [facility];  Service: General;  Laterality: Left;    There were no vitals filed for this visit.  Subjective Assessment - 07/11/19 0914    Subjective  Pt states that she has been wearing her sleeve 12 hours/day. She states that she still feels the sharp pulling from the cord. Her incision has not yet completely closed.    Pertinent History  Pt has removed of benign breast mass on 02/10/2019 with complication of seroma and clogged drain and possible infection.  She also developed lymphedema in her left breast and axillary cording.  She receieved extensive PT with MLD, compression, manual therapy and dry needling, but had a significant persistent cord that was painful and limited shoulder ROM  She underwent and incision of that contracted scar tissue on 06/22/2019 that included one node and develped a seroma after that.    Patient Stated Goals  To get her arm back to normal    Currently in Pain?  Yes    Pain Score  3     Pain Location  Axilla    Pain Orientation  Left    Pain Descriptors / Indicators  Aching    Pain Onset  More than a month ago    Pain Frequency  Intermittent    Aggravating Factors   at rest    Pain Relieving Factors  compression and MLD    Effect of Pain on Daily Activities  limits work activities                       [facility] Adult PT Treatment/Exercise - 07/11/19 0001      Moist Heat Therapy   Number Minutes Moist Heat  10 Minutes    Moist Heat Location  --   medial brachium/axilla during MLD for the L breast     Manual Therapy   Manual Therapy  Myofascial release;Manual Lymphatic Drainage (MLD);Passive ROM    Myofascial Release  In abduction following mild heat longitudinally in the L brachium and along the L lateral breast and anterior deltoid/pectoralis major with visual improvement following myofascial release.  Manual Lymphatic Drainage (MLD)  While mild heat is on the L medial brachium: In supine: MLD performed just to the L breast short neck, swimming in the terminus, Bil axillary and L inguinal nodes, anterior inter-axillary anastomosis, L axillo-inguinal anastomosis, superior L breast toward anteiror inter-axillary anastomosis then re-worked ansatomosis, inferior L breast toward L axillo-inguinal anastomosis then re-worked anastomosis, medial to lateral L brachium, lateral L brachium, re-worked all surfaces    Passive ROM  Into flexion/abduction with scapular blocking down/medially to prevent compensation and stretch tight musculature.              PT Education - 07/11/19 1036    Education Details  Pt is going to begin low level strengthening with light weights for the LUE for the biceps, triceps, wrist flexors/extensors,  shoulder shrug/depression, bent over row and shoulder extension w/scapular retraction at home to improve muscle pump activity at the LUE.    Person(s) Educated  Patient    Comprehension  Verbalized understanding          PT Long Term Goals - 06/24/19 1357      PT LONG TERM GOAL #1   Title  Pt will be independent in self MLD and use of compression to manage fullness in left upper quadrant    Time  8    Period  Weeks    Status  Achieved      PT LONG TERM GOAL #2   Title  Pt will have 150 degrees of left shoulder flexion and abduction so that she can return to normal activities    Time  8    Period  Weeks    Status  On-going      PT LONG TERM GOAL #3   Title  Pt will report she is able to do her job that involves manual work with both arms with no increase in pain or cording    Time  8    Period  Weeks    Status  On-going      PT LONG TERM GOAL #4   Title  Pt will have decrease in visual and palpable axillary cording by 50%  per photo and verbal report    Time  8    Period  Weeks    Status  On-going            Plan - 07/11/19 0914    Clinical Impression Statement  Pt presents with continued cording in her L axilla/L lateral breast to the nipple; it has improved significantly since her last session. She continues with winging compensation at mid to end range shoulder abduction and flexion most likely due to muscular tightness at the teres major/minor and wing of L latissimus dorsi. Pt report impingment into full flexion/abduction possibly related to muscular tightness that improves with light TpR at the distal upper trapezius. Pt cording improves following mild heat and longitudinal myofascial release at cording in the L brachium and the L breast. Noted tightness in the fascia over the L pectoralis and anterior deltoid; decreased mildly following longitudinal myofascial release. P/ROM performed with scapular blocking in order to stretch tight musculature. Pt will benefit from  continued POC at this time.    Personal Factors and Comorbidities  Comorbidity 2    Comorbidities  breast surgery with seroma. previous history of increased scarring after other surgeries    Examination-Activity Limitations  Reach Overhead    Rehab Potential  Good    PT Frequency  3x / week  PT Duration  8 weeks    PT Treatment/Interventions  ADLs/Self Care Home Management;Therapeutic exercise;Orthotic Fit/Training;Patient/family education;Manual techniques;Manual lymph drainage;Passive range of motion;Scar mobilization;Taping;Functional mobility training;DME Instruction;Therapeutic activities;Neuromuscular re-education;Dry needling    PT Next Visit Plan  Work on L teres major/minor and latissimus wing on the L, focus on MLD of the L breast/arm, myofascial release for cording    PT Home Exercise Plan  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    Recommended Other Services  Pt will be measured by [facility] for possible night garment of the L breast. Vasopneumatic pump, compression sleeves (possibly figure out due to difficulty with discomfort and venous distention)    Consulted and Agree with Plan of Care  Patient       Patient will benefit from skilled therapeutic intervention in order to improve the following deficits and impairments:  Pain, Impaired UE functional use, Impaired flexibility, Increased fascial restricitons, Decreased strength, Decreased knowledge of use of DME, Decreased range of motion, Decreased scar mobility, Increased edema, Impaired perceived functional ability, Decreased skin integrity, Postural dysfunction  Visit Diagnosis: Aftercare following surgery for neoplasm  Stiffness of left shoulder, not elsewhere classified  Muscle weakness (generalized)  Acute pain of left shoulder     Problem List Patient Active Problem List   Diagnosis Date Noted  . Numbness 02/04/2016  . Cognitive changes 02/04/2016  . Other  fatigue 02/04/2016    Ander Purpura, PT 07/11/2019, 10:50 AM  [place], [place], [address] Phone: [phone]   Fax:  [phone]  Name: KIMBELLA HEISLER MRN: [id number] Date of Birth: March 21, 1971

## 2019-07-12 ENCOUNTER — Ambulatory Visit: Payer: Federal, State, Local not specified - PPO | Admitting: Physical Therapy

## 2019-07-12 ENCOUNTER — Encounter: Payer: Self-pay | Admitting: Physical Therapy

## 2019-07-12 DIAGNOSIS — M6281 Muscle weakness (generalized): Secondary | ICD-10-CM

## 2019-07-12 DIAGNOSIS — M25612 Stiffness of left shoulder, not elsewhere classified: Secondary | ICD-10-CM | POA: Diagnosis not present

## 2019-07-12 DIAGNOSIS — M25512 Pain in left shoulder: Secondary | ICD-10-CM | POA: Diagnosis not present

## 2019-07-12 DIAGNOSIS — Z483 Aftercare following surgery for neoplasm: Secondary | ICD-10-CM | POA: Diagnosis not present

## 2019-07-12 NOTE — Therapy (Signed)
[place], [place], [address] Phone: [phone]   Fax:  [phone]  Physical Therapy Treatment  Patient Details  Name: Sheena Jarvis MRN: [id number] Date of Birth: [birth year] Referring Provider (PT): Dr.Byerly    Encounter Date: 07/12/2019  PT End of Session - 07/12/19 1726    Number of Visits  53    Date for PT Re-Evaluation  09/06/19    PT Start Time  1603    PT Stop Time  1650    PT Time Calculation (min)  47 min    Activity Tolerance  Patient tolerated treatment well    Behavior During Therapy  [facility] for tasks assessed/performed       Past Medical History:  Diagnosis Date  . Pneumonia   . PONV (postoperative nausea and vomiting)   . Vision abnormalities     Past Surgical History:  Procedure Laterality Date  . APPENDECTOMY    . BREAST CYST ASPIRATION Left   . HIP ARTHROSCOPY    . MASS EXCISION Left 02/10/2019   Procedure: EXCISION OF LEFT BREAST MASS;  Surgeon: Stark Klein, MD;  Location: [facility];  Service: General;  Laterality: Left;  . SCAR REVISION Left 06/22/2019   Procedure: LEFT AXILLARY CONTRACTURE RELEASE;  Surgeon: Stark Klein, MD;  Location: [place] [facility];  Service: General;  Laterality: Left;    There were no vitals filed for this visit.  Subjective Assessment - 07/12/19 1605    Subjective  Pt states she saw Dr. Barry Dienes this morning.  She will be having a doppler on Friday.  She said that there were abnormal cells from the first surgery, so she may have had a reaction to the sutures.  She got a WearEase elbow shirt that is too big. She had her Flexitouch demonstration today an felt that she had some reduction    Pertinent History  Pt has removed of benign breast mass on 35/07/6142 with complication of seroma and clogged drain and possible infection.  She also developed lymphedema in her left breast and axillary cording.  She receieved extensive PT  with MLD, compression, manual therapy and dry needling, but had a significant persistent cord that was painful and limited shoulder ROM She underwent and incision of that contracted scar tissue on 06/22/2019 that included one node and develped a seroma after that.    Currently in Pain?  No/denies                       [facility] Adult PT Treatment/Exercise - 07/12/19 0001      Manual Therapy   Manual Therapy  Myofascial release;Manual Lymphatic Drainage (MLD);Passive ROM    Edema Management  upgraded to big dotted foam for lateral breast     Joint Mobilization  mobs to [facility] joint A/P direction    Soft tissue mobilization  soft tissue work to posterior scapular muscles with scapular mobilizaion     Myofascial Release  In supine and sidelying to left axilla and lateral chest and breast  Extra time spent on fullness medical to intial incision     Manual Lymphatic Drainage (MLD)  In supine: MLD performed just to L brachium, short neck, swimming in the terminus, Bil axillary and L inguinal nodes, anterior inter-axillary anastomosis, L axillo-inguinal anastomosis, medial to lateral L brachium, lateral L brachium, L shoulder, re-worked all surfaces; deep abdominals.  PT Long Term Goals - 06/24/19 1357      PT LONG TERM GOAL #1   Title  Pt will be independent in self MLD and use of compression to manage fullness in left upper quadrant    Time  8    Period  Weeks    Status  Achieved      PT LONG TERM GOAL #2   Title  Pt will have 150 degrees of left shoulder flexion and abduction so that she can return to normal activities    Time  8    Period  Weeks    Status  On-going      PT LONG TERM GOAL #3   Title  Pt will report she is able to do her job that involves manual work with both arms with no increase in pain or cording    Time  8    Period  Weeks    Status  On-going      PT LONG TERM GOAL #4   Title  Pt will have decrease in visual and palpable  axillary cording by 50%  per photo and verbal report    Time  8    Period  Weeks    Status  On-going            Plan - 07/12/19 1727    Clinical Impression Statement  Pt much improved and feels that today is a "good day"  She had positive results from Flexitouch trial and is looking forward to getting fitted for compression garments this week.  She is responding to PT and manual work along with compression garments to decrease fullness in breast and cording.    Personal Factors and Comorbidities  Comorbidity 2    Comorbidities  breast surgery with seroma. previous history of increased scarring after other surgeries    Stability/Clinical Decision Making  Stable/Uncomplicated    Rehab Potential  Good    PT Frequency  3x / week    PT Treatment/Interventions  ADLs/Self Care Home Management;Therapeutic exercise;Orthotic Fit/Training;Patient/family education;Manual techniques;Manual lymph drainage;Passive range of motion;Scar mobilization;Taping;Functional mobility training;DME Instruction;Therapeutic activities;Neuromuscular re-education;Dry needling    PT Next Visit Plan  as needed , Work on L teres major/minor and latissimus wing on the L, focus on MLD of the L breast/arm, myofascial release for cording    PT Home Exercise Plan  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       Patient will benefit from skilled therapeutic intervention in order to improve the following deficits and impairments:  Pain, Impaired UE functional use, Impaired flexibility, Increased fascial restricitons, Decreased strength, Decreased knowledge of use of DME, Decreased range of motion, Decreased scar mobility, Increased edema, Impaired perceived functional ability, Decreased skin integrity, Postural dysfunction  Visit Diagnosis: Aftercare following surgery for neoplasm  Stiffness of left shoulder, not elsewhere classified  Muscle weakness (generalized)  Acute  pain of left shoulder     Problem List Patient Active Problem List   Diagnosis Date Noted  . Numbness 02/04/2016  . Cognitive changes 02/04/2016  . Other fatigue 02/04/2016   Bayard Hugger. Manson Passey PT  Donnetta Hail 07/12/2019, 5:31 PM  [facility] [address] [place], [place], [address] Phone: [phone]   Fax:  [phone]  Name: Sheena Jarvis MRN: [id number] Date of Birth: [birth year]

## 2019-07-13 ENCOUNTER — Encounter (HOSPITAL_COMMUNITY): Payer: Federal, State, Local not specified - PPO

## 2019-07-15 ENCOUNTER — Other Ambulatory Visit (HOSPITAL_COMMUNITY): Payer: Self-pay | Admitting: General Surgery

## 2019-07-15 ENCOUNTER — Ambulatory Visit: Payer: Federal, State, Local not specified - PPO

## 2019-07-15 ENCOUNTER — Other Ambulatory Visit: Payer: Self-pay

## 2019-07-15 ENCOUNTER — Ambulatory Visit (HOSPITAL_COMMUNITY)
Admission: RE | Admit: 2019-07-15 | Discharge: 2019-07-15 | Disposition: A | Discharge status: Home / Self Care | Payer: Federal, State, Local not specified - PPO | Source: Ambulatory Visit | Attending: Cardiology | Admitting: Cardiology

## 2019-07-15 DIAGNOSIS — Z483 Aftercare following surgery for neoplasm: Secondary | ICD-10-CM

## 2019-07-15 DIAGNOSIS — M25512 Pain in left shoulder: Secondary | ICD-10-CM | POA: Diagnosis not present

## 2019-07-15 DIAGNOSIS — M79603 Pain in arm, unspecified: Secondary | ICD-10-CM

## 2019-07-15 DIAGNOSIS — M6281 Muscle weakness (generalized): Secondary | ICD-10-CM

## 2019-07-15 DIAGNOSIS — M25612 Stiffness of left shoulder, not elsewhere classified: Secondary | ICD-10-CM | POA: Diagnosis not present

## 2019-07-15 DIAGNOSIS — M79602 Pain in left arm: Secondary | ICD-10-CM | POA: Diagnosis not present

## 2019-07-15 NOTE — Therapy (Signed)
[facility] [address] [place], [place], [address] Phone: [phone]   Fax:  [phone]  Physical Therapy Treatment  Patient Details  Name: Sheena Jarvis MRN: [id number] Date of Birth: 06-02-1970 Referring Provider (PT): Dr.Byerly    Encounter Date: 07/15/2019  PT End of Session - 07/15/19 0807    Visit Number  38    Number of Visits  53    Date for PT Re-Evaluation  09/06/19    PT Start Time  0806    PT Stop Time  0859    PT Time Calculation (min)  53 min    Activity Tolerance  Patient tolerated treatment well    Behavior During Therapy  [facility] for tasks assessed/performed       Past Medical History:  Diagnosis Date  . Pneumonia   . PONV (postoperative nausea and vomiting)   . Vision abnormalities     Past Surgical History:  Procedure Laterality Date  . APPENDECTOMY    . BREAST CYST ASPIRATION Left   . HIP ARTHROSCOPY    . MASS EXCISION Left 02/10/2019   Procedure: EXCISION OF LEFT BREAST MASS;  Surgeon: Almond Lint, MD;  Location: [facility];  Service: General;  Laterality: Left;  . SCAR REVISION Left 06/22/2019   Procedure: LEFT AXILLARY CONTRACTURE RELEASE;  Surgeon: Almond Lint, MD;  Location: [facility];  Service: General;  Laterality: Left;    There were no vitals filed for this visit.  Subjective Assessment - 07/15/19 0807    Subjective  Pt states that she has a doppler this afternoon. She states that she tried the pump and really enjoyed it. She is still finding something that gives her enough compression over her chest due to she has a small circumference of her ribs.    Pertinent History  Pt has removed of benign breast mass on 02/10/2019 with complication of seroma and clogged drain and possible infection.  She also developed lymphedema in her left breast and axillary cording.  She receieved extensive PT with MLD, compression, manual therapy and dry needling, but had a  significant persistent cord that was painful and limited shoulder ROM She underwent and incision of that contracted scar tissue on 06/22/2019 that included one node and develped a seroma after that.    Patient Stated Goals  To get her arm back to normal    Currently in Pain?  Yes    Pain Score  2     Pain Location  Axilla    Pain Orientation  Left    Pain Descriptors / Indicators  Aching    Pain Type  Surgical pain    Pain Onset  More than a month ago    Pain Frequency  Intermittent    Aggravating Factors   at rest    Pain Relieving Factors  compression and MLD    Effect of Pain on Daily Activities  limits work activities                       [facility] Adult PT Treatment/Exercise - 07/15/19 0001      Moist Heat Therapy   Number Minutes Moist Heat  10 Minutes    Moist Heat Location  --   L medial brachium/axillary area     Manual Therapy   Manual Therapy  Myofascial release;Manual Lymphatic Drainage (MLD);Passive ROM    Myofascial Release  Longitudinal along the L lateral breast and medial forearm to the  elbow.     Manual Lymphatic Drainage (MLD)  In supine: MLD performed to L brachium and breast, short neck, swimming in the terminus, Bil axillary and L inguinal nodes, anterior inter-axillary anastomosis, L axillo-inguinal anastomosis, Superior breast and inferior breast toward anastomosis then re-worked anastomosis, medial to lateral L brachium, lateral L brachium, L shoulder, re-worked all surfaces; deep abdominals.     Passive ROM  Into flexion/abduction with myofascial release throughout. Thick cording noted along the lateral aspect of the L breast to the inferior aspect.             PT Education - 07/15/19 0943    Education Details  Pt will continue with exercises at home. She will only perform light myofascial release instead of deep tissue to decrease inflammation and further cording in the L axillary area.    Person(s) Educated  Patient    Methods   Explanation    Comprehension  Verbalized understanding          PT Long Term Goals - 06/24/19 1357      PT LONG TERM GOAL #1   Title  Pt will be independent in self MLD and use of compression to manage fullness in left upper quadrant    Time  8    Period  Weeks    Status  Achieved      PT LONG TERM GOAL #2   Title  Pt will have 150 degrees of left shoulder flexion and abduction so that she can return to normal activities    Time  8    Period  Weeks    Status  On-going      PT LONG TERM GOAL #3   Title  Pt will report she is able to do her job that involves manual work with both arms with no increase in pain or cording    Time  8    Period  Weeks    Status  On-going      PT LONG TERM GOAL #4   Title  Pt will have decrease in visual and palpable axillary cording by 50%  per photo and verbal report    Time  8    Period  Weeks    Status  On-going            Plan - 07/15/19 0806    Clinical Impression Statement  Pt cording has increased in thickness this session from her last session. She continues forming scar tissue in the axillary lateral breast area. Mild heat with light myofascial release continues to improve ROM and adhesions/cording that improve ROM/pain following physical therapy treatment. MLD performed prior to myofascial release and heat and then repeated again following myofascial release. Pt will benefit from continued easy stretching/myofascial release and MLD to decrease inflammation in order to decrease risk for further cord formation. Pt will benefit from continued POC at this time    Personal Factors and Comorbidities  Comorbidity 2    Comorbidities  breast surgery with seroma. previous history of increased scarring after other surgeries    Examination-Activity Limitations  Reach Overhead    Rehab Potential  Good    PT Frequency  3x / week    PT Duration  8 weeks    PT Treatment/Interventions  ADLs/Self Care Home Management;Therapeutic exercise;Orthotic  Fit/Training;Patient/family education;Manual techniques;Manual lymph drainage;Passive range of motion;Scar mobilization;Taping;Functional mobility training;DME Instruction;Therapeutic activities;Neuromuscular re-education;Dry needling    PT Next Visit Plan  as needed , Work on L teres major/minor and latissimus  wing on the L, focus on MLD of the L breast/arm, myofascial release for cording    PT Home Exercise Plan  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    Consulted and Agree with Plan of Care  Patient       Patient will benefit from skilled therapeutic intervention in order to improve the following deficits and impairments:  Pain, Impaired UE functional use, Impaired flexibility, Increased fascial restricitons, Decreased strength, Decreased knowledge of use of DME, Decreased range of motion, Decreased scar mobility, Increased edema, Impaired perceived functional ability, Decreased skin integrity, Postural dysfunction  Visit Diagnosis: Aftercare following surgery for neoplasm  Stiffness of left shoulder, not elsewhere classified  Muscle weakness (generalized)  Acute pain of left shoulder     Problem List Patient Active Problem List   Diagnosis Date Noted  . Numbness 02/04/2016  . Cognitive changes 02/04/2016  . Other fatigue 02/04/2016    Claudia Desanctis, PT 07/15/2019, 9:47 AM  [facility] [address] [place], [place], [address] Phone: [phone]   Fax:  [phone]  Name: RIVKA BAUNE MRN: [id number] Date of Birth: [birth year]

## 2019-07-18 ENCOUNTER — Other Ambulatory Visit: Payer: Self-pay

## 2019-07-18 ENCOUNTER — Ambulatory Visit: Payer: Federal, State, Local not specified - PPO

## 2019-07-18 DIAGNOSIS — M6281 Muscle weakness (generalized): Secondary | ICD-10-CM

## 2019-07-18 DIAGNOSIS — M25612 Stiffness of left shoulder, not elsewhere classified: Secondary | ICD-10-CM

## 2019-07-18 DIAGNOSIS — M25512 Pain in left shoulder: Secondary | ICD-10-CM

## 2019-07-18 DIAGNOSIS — Z483 Aftercare following surgery for neoplasm: Secondary | ICD-10-CM

## 2019-07-18 NOTE — Therapy (Signed)
[place], [place], [address] Phone: [phone]   Fax:  [phone]  Physical Therapy Treatment  Patient Details  Name: Sheena Jarvis MRN: [id number] Date of Birth: October 27, 1970 Referring Provider (PT): Dr.Byerly    Encounter Date: 07/18/2019  PT End of Session - 07/18/19 0801    Visit Number  39    Number of Visits  53    Date for PT Re-Evaluation  09/06/19    PT Start Time  0801    PT Stop Time  0900    PT Time Calculation (min)  59 min    Activity Tolerance  Patient tolerated treatment well    Behavior During Therapy  [facility] for tasks assessed/performed       Past Medical History:  Diagnosis Date  . Pneumonia   . PONV (postoperative nausea and vomiting)   . Vision abnormalities     Past Surgical History:  Procedure Laterality Date  . APPENDECTOMY    . BREAST CYST ASPIRATION Left   . HIP ARTHROSCOPY    . MASS EXCISION Left 02/10/2019   Procedure: EXCISION OF LEFT BREAST MASS;  Surgeon: Stark Klein, MD;  Location: [place];  Service: General;  Laterality: Left;  . SCAR REVISION Left 06/22/2019   Procedure: LEFT AXILLARY CONTRACTURE RELEASE;  Surgeon: Stark Klein, MD;  Location: [place];  Service: General;  Laterality: Left;    There were no vitals filed for this visit.  Subjective Assessment - 07/18/19 0909    Subjective  Pt states that she was doing a lot of yard work over the weekend and it made her cord very tight. She states that she did some work on it and felt a small pop with improved ROM. She states that she has recieved her vasopnuematic pump and wa able to use it over the weekend.    Pertinent History  Pt has removed of benign breast mass on 33/09/4560 with complication of seroma and clogged drain and possible infection.  She also developed lymphedema in her left breast and axillary cording.  She receieved extensive PT with MLD, compression, manual  therapy and dry needling, but had a significant persistent cord that was painful and limited shoulder ROM She underwent and incision of that contracted scar tissue on 06/22/2019 that included one node and develped a seroma after that.    Patient Stated Goals  To get her arm back to normal    Currently in Pain?  Yes    Pain Score  2     Pain Location  Axilla    Pain Orientation  Left    Pain Descriptors / Indicators  Aching    Pain Type  Surgical pain    Pain Onset  More than a month ago    Pain Frequency  Intermittent    Aggravating Factors   at rest    Pain Relieving Factors  compression and MLD    Effect of Pain on Daily Activities  limits work activities                       [facility] PT Treatment/Exercise - 07/18/19 0001      Moist Heat Therapy   Number Minutes Moist Heat  10 Minutes    Moist Heat Location  --   L medial brachium, axilla and L lateral breast     Manual Therapy   Manual Therapy  Myofascial release;Manual Lymphatic Drainage (MLD);Passive ROM;Soft  tissue mobilization;Joint mobilization    Joint Mobilization  16x to the [facility] joint posteriorly following reports of impringment in abduction; decreased feelings of impingement following grade III mobs     Soft tissue mobilization  STM to the subscapularis and teres muscle group with decresaed winging and improved P/ROM with scapular stabilization following TpR/STM    Myofascial Release  Longitudinal along the L lateral breast and medial forearm to the elbow.     Manual Lymphatic Drainage (MLD)  In supine: MLD performed to L brachium and breast, short neck, swimming in the terminus, Bil axillary and L inguinal nodes, anterior inter-axillary anastomosis, L axillo-inguinal anastomosis, Superior breast and inferior breast toward anastomosis then re-worked anastomosis, medial to lateral L brachium, lateral L brachium, L shoulder, re-worked all surfaces; deep abdominals.     Passive ROM  Into flexion/abduction with  myofascial release throughout. Thick cording noted along the lateral aspect of the L breast to the inferior aspect.slightly improved this session.              PT Education - 07/18/19 0950    Education Details  Pt will continue wtih exercises at home, light myofascial release, wearing compression and using her vasopnematic pump at home.    Person(s) Educated  Patient    Methods  Explanation    Comprehension  Verbalized understanding          PT Long Term Goals - 06/24/19 1357      PT LONG TERM GOAL #1   Title  Pt will be independent in self MLD and use of compression to manage fullness in left upper quadrant    Time  8    Period  Weeks    Status  Achieved      PT LONG TERM GOAL #2   Title  Pt will have 150 degrees of left shoulder flexion and abduction so that she can return to normal activities    Time  8    Period  Weeks    Status  On-going      PT LONG TERM GOAL #3   Title  Pt will report she is able to do her job that involves manual work with both arms with no increase in pain or cording    Time  8    Period  Weeks    Status  On-going      PT LONG TERM GOAL #4   Title  Pt will have decrease in visual and palpable axillary cording by 50%  per photo and verbal report    Time  8    Period  Weeks    Status  On-going            Plan - 07/18/19 0801    Clinical Impression Statement  Pt cording is starting to improve; small cord at the deltoid was not noted today. No new scar tissue noted in the axillary or lateral breast this session. Mild heat with light myofascial release continues to improve ROM and adhesions/cording that improve ROM/pain following physical therapy treatment. STM was performed to the subscpaularis and teres muscle gopur following noted winging at the L scapula with abduction; improvement in ROM with scapular blocking following STM. Mobs performed at the L [facility] joint improved reports of impingement. MLD was perofmred prior to myofascial release  and STM whilt p thad heat applied to the arm and then modified short MLD with less passes perfomed following manual work. Pt will benefit from continued easy stretching/myofascial release and MLD  to decrease inflammation in order to decrease risk for further cord formation. Pt will benefit from continued POC at this time.    Personal Factors and Comorbidities  Comorbidity 2    Comorbidities  breast surgery with seroma. previous history of increased scarring after other surgeries    Examination-Activity Limitations  Reach Overhead    Rehab Potential  Good    PT Frequency  3x / week    PT Duration  8 weeks    PT Treatment/Interventions  ADLs/Self Care Home Management;Therapeutic exercise;Orthotic Fit/Training;Patient/family education;Manual techniques;Manual lymph drainage;Passive range of motion;Scar mobilization;Taping;Functional mobility training;DME Instruction;Therapeutic activities;Neuromuscular re-education;Dry needling    PT Next Visit Plan  as needed , Work on L teres major/minor and latissimus wing on the L, focus on MLD of the L breast/arm, myofascial release for cording    PT Home Exercise Plan  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    Consulted and Agree with Plan of Care  Patient       Patient will benefit from skilled therapeutic intervention in order to improve the following deficits and impairments:  Pain, Impaired UE functional use, Impaired flexibility, Increased fascial restricitons, Decreased strength, Decreased knowledge of use of DME, Decreased range of motion, Decreased scar mobility, Increased edema, Impaired perceived functional ability, Decreased skin integrity, Postural dysfunction  Visit Diagnosis: Aftercare following surgery for neoplasm  Stiffness of left shoulder, not elsewhere classified  Muscle weakness (generalized)  Acute pain of left shoulder     Problem List Patient Active Problem List    Diagnosis Date Noted  . Numbness 02/04/2016  . Cognitive changes 02/04/2016  . Other fatigue 02/04/2016    Claudia Desanctis, PT 07/18/2019, 9:58 AM  [facility] [address] [place], [place], [address] Phone: [phone]   Fax:  [phone]  Name: Sheena Jarvis MRN: [id number] Date of Birth: [birth year]

## 2019-07-19 ENCOUNTER — Encounter: Payer: Self-pay | Admitting: Physical Therapy

## 2019-07-20 ENCOUNTER — Ambulatory Visit: Payer: Federal, State, Local not specified - PPO

## 2019-07-20 ENCOUNTER — Other Ambulatory Visit: Payer: Self-pay

## 2019-07-20 DIAGNOSIS — M25512 Pain in left shoulder: Secondary | ICD-10-CM

## 2019-07-20 DIAGNOSIS — M6281 Muscle weakness (generalized): Secondary | ICD-10-CM | POA: Diagnosis not present

## 2019-07-20 DIAGNOSIS — Z483 Aftercare following surgery for neoplasm: Secondary | ICD-10-CM | POA: Diagnosis not present

## 2019-07-20 DIAGNOSIS — M25612 Stiffness of left shoulder, not elsewhere classified: Secondary | ICD-10-CM

## 2019-07-20 NOTE — Therapy (Signed)
[facility] [address] [place], [place], [address] Phone: [phone]   Fax:  [phone]  Physical Therapy Treatment  Patient Details  Name: Sheena Jarvis MRN: [id number] Date of Birth: 1970/06/12 Referring Provider (PT): Dr.Byerly    Encounter Date: 07/20/2019  PT End of Session - 07/20/19 0803    Visit Number  40    Number of Visits  53    Date for PT Re-Evaluation  09/06/19    PT Start Time  0803    PT Stop Time  0856    PT Time Calculation (min)  53 min    Activity Tolerance  Patient tolerated treatment well    Behavior During Therapy  [facility] for tasks assessed/performed       Past Medical History:  Diagnosis Date  . Pneumonia   . PONV (postoperative nausea and vomiting)   . Vision abnormalities     Past Surgical History:  Procedure Laterality Date  . APPENDECTOMY    . BREAST CYST ASPIRATION Left   . HIP ARTHROSCOPY    . MASS EXCISION Left 02/10/2019   Procedure: EXCISION OF LEFT BREAST MASS;  Surgeon: Almond Lint, MD;  Location: [facility];  Service: General;  Laterality: Left;  . SCAR REVISION Left 06/22/2019   Procedure: LEFT AXILLARY CONTRACTURE RELEASE;  Surgeon: Almond Lint, MD;  Location: [facility];  Service: General;  Laterality: Left;    There were no vitals filed for this visit.  Subjective Assessment - 07/20/19 0803    Subjective  Pt states that she was rubbing her cord last night and felt another pop. She states that she has been using her pump and feels that her fluid is getting better in her L breast.    Pertinent History  Pt has removed of benign breast mass on 02/10/2019 with complication of seroma and clogged drain and possible infection.  She also developed lymphedema in her left breast and axillary cording.  She receieved extensive PT with MLD, compression, manual therapy and dry needling, but had a significant persistent cord that was painful and limited  shoulder ROM She underwent and incision of that contracted scar tissue on 06/22/2019 that included one node and develped a seroma after that.    Patient Stated Goals  To get her arm back to normal    Currently in Pain?  No/denies    Pain Score  0-No pain                       [facility] Adult PT Treatment/Exercise - 07/20/19 0001      Moist Heat Therapy   Number Minutes Moist Heat  10 Minutes    Moist Heat Location  --   lateral L breast, axilla and medial L brachium     Manual Therapy   Manual Therapy  Myofascial release;Manual Lymphatic Drainage (MLD);Passive ROM;Soft tissue mobilization    Soft tissue mobilization  STM to the serratus anterior mid part near the inferior L breast and the wing of the L latissimus toward the humerus with the arm in abduction; improvement in early activation of the scapula following STM.     Myofascial Release  Longitudinal along the L lateral breast and medial forearm to the elbow.     Manual Lymphatic Drainage (MLD)  In supine: MLD performed to L brachium and breast, short neck, swimming in the terminus, Bil axillary and L inguinal nodes, anterior inter-axillary anastomosis, L axillo-inguinal anastomosis, Superior breast  and inferior breast toward anastomosis then re-worked anastomosis, medial to lateral L brachium, lateral L brachium, L shoulder, re-worked all surfaces; deep abdominals.     Passive ROM  into flexion/abduction with STM and myofascial release as well as scapular blocking to prevent excessive scapular movement.              PT Education - 07/20/19 0859    Education Details  Discussed performing A/ROM to keep movement gained w/o early scapular activation and performing scapular stabilization exercises to improve scapulo-humeral rhythm.    Person(s) Educated  Patient    Methods  Explanation    Comprehension  Verbalized understanding          PT Long Term Goals - 06/24/19 1357      PT LONG TERM GOAL #1   Title  Pt will  be independent in self MLD and use of compression to manage fullness in left upper quadrant    Time  8    Period  Weeks    Status  Achieved      PT LONG TERM GOAL #2   Title  Pt will have 150 degrees of left shoulder flexion and abduction so that she can return to normal activities    Time  8    Period  Weeks    Status  On-going      PT LONG TERM GOAL #3   Title  Pt will report she is able to do her job that involves manual work with both arms with no increase in pain or cording    Time  8    Period  Weeks    Status  On-going      PT LONG TERM GOAL #4   Title  Pt will have decrease in visual and palpable axillary cording by 50%  per photo and verbal report    Time  8    Period  Weeks    Status  On-going            Plan - 07/20/19 0802    Clinical Impression Statement  Pt cording continues to improve; significant softening in the L lateral breast with less cording noted toward the inferior breast. Palpable tightness/tenderness noted in the L serratus anterior and latissimus dorsi; improved following STM as well as improved scapulo-humeral rhythm with less early movement of the scapula. Pt continues with sudden full winging of the L scapula with shoulder abduction/flexion. Less reports of impingment this session with shoulder ROM. MLD was performed prior to STM/myofascial release and then re-worked after. Pt will benefit from continued POC at this time.    Personal Factors and Comorbidities  Comorbidity 2    Comorbidities  breast surgery with seroma. previous history of increased scarring after other surgeries    Examination-Activity Limitations  Reach Overhead    Rehab Potential  Good    PT Frequency  3x / week    PT Duration  8 weeks    PT Treatment/Interventions  ADLs/Self Care Home Management;Therapeutic exercise;Orthotic Fit/Training;Patient/family education;Manual techniques;Manual lymph drainage;Passive range of motion;Scar mobilization;Taping;Functional mobility  training;DME Instruction;Therapeutic activities;Neuromuscular re-education;Dry needling    PT Next Visit Plan  as needed , Work on L teres major/minor, serratus and latissimus wing on the L, focus on MLD of the L breast/arm, myofascial release for cording    PT Home Exercise Plan  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    Consulted and Agree with Plan of Care  Patient       Patient will benefit from skilled therapeutic intervention in order to improve the following deficits and impairments:  Pain, Impaired UE functional use, Impaired flexibility, Increased fascial restricitons, Decreased strength, Decreased knowledge of use of DME, Decreased range of motion, Decreased scar mobility, Increased edema, Impaired perceived functional ability, Decreased skin integrity, Postural dysfunction  Visit Diagnosis: Aftercare following surgery for neoplasm  Stiffness of left shoulder, not elsewhere classified  Muscle weakness (generalized)  Acute pain of left shoulder     Problem List Patient Active Problem List   Diagnosis Date Noted  . Numbness 02/04/2016  . Cognitive changes 02/04/2016  . Other fatigue 02/04/2016    Ander Purpura, PT 07/20/2019, 9:03 AM  [place], [place], [address] Phone: [phone]   Fax:  [phone]  Name: Sheena Jarvis MRN: [id number] Date of Birth: 1971/02/24

## 2019-07-22 ENCOUNTER — Ambulatory Visit: Payer: Federal, State, Local not specified - PPO

## 2019-07-22 ENCOUNTER — Other Ambulatory Visit: Payer: Self-pay

## 2019-07-22 DIAGNOSIS — Z483 Aftercare following surgery for neoplasm: Secondary | ICD-10-CM

## 2019-07-22 DIAGNOSIS — M25512 Pain in left shoulder: Secondary | ICD-10-CM

## 2019-07-22 DIAGNOSIS — M6281 Muscle weakness (generalized): Secondary | ICD-10-CM

## 2019-07-22 DIAGNOSIS — M25612 Stiffness of left shoulder, not elsewhere classified: Secondary | ICD-10-CM

## 2019-07-22 NOTE — Therapy (Signed)
[facility] [address] [place], [place], [address] Phone: [phone]   Fax:  [phone]  Physical Therapy Treatment  Patient Details  Name: Sheena Jarvis MRN: [id number] Date of Birth: December 13, 1970 Referring Provider (PT): Dr.Byerly    Encounter Date: 07/22/2019  PT End of Session - 07/22/19 0817    Visit Number  41    Number of Visits  53    Date for PT Re-Evaluation  09/06/19    PT Start Time  0805    PT Stop Time  0858    PT Time Calculation (min)  53 min    Activity Tolerance  Patient tolerated treatment well    Behavior During Therapy  [facility] for tasks assessed/performed       Past Medical History:  Diagnosis Date  . Pneumonia   . PONV (postoperative nausea and vomiting)   . Vision abnormalities     Past Surgical History:  Procedure Laterality Date  . APPENDECTOMY    . BREAST CYST ASPIRATION Left   . HIP ARTHROSCOPY    . MASS EXCISION Left 02/10/2019   Procedure: EXCISION OF LEFT BREAST MASS;  Surgeon: Almond Lint, MD;  Location: [facility];  Service: General;  Laterality: Left;  . SCAR REVISION Left 06/22/2019   Procedure: LEFT AXILLARY CONTRACTURE RELEASE;  Surgeon: Almond Lint, MD;  Location: [facility];  Service: General;  Laterality: Left;    There were no vitals filed for this visit.  Subjective Assessment - 07/22/19 0818    Subjective  Pt states that she has been doing well. She has some aching in her mid-medial brachium.    Pertinent History  Pt has removed of benign breast mass on 02/10/2019 with complication of seroma and clogged drain and possible infection.  She also developed lymphedema in her left breast and axillary cording.  She receieved extensive PT with MLD, compression, manual therapy and dry needling, but had a significant persistent cord that was painful and limited shoulder ROM She underwent and incision of that contracted scar tissue on 06/22/2019 that  included one node and develped a seroma after that.    Patient Stated Goals  To get her arm back to normal    Currently in Pain?  Yes    Pain Score  2     Pain Location  Arm    Pain Orientation  Left;Medial;Upper;Mid    Pain Descriptors / Indicators  Aching    Pain Type  Surgical pain    Pain Onset  More than a month ago    Aggravating Factors   at rest    Pain Relieving Factors  compression and MLD    Effect of Pain on Daily Activities  limits work activities                       [facility] Adult PT Treatment/Exercise - 07/22/19 0001      Moist Heat Therapy   Number Minutes Moist Heat  10 Minutes    Moist Heat Location  --   L lateral breast, medial forearm and axilla during MLD     Manual Therapy   Manual Therapy  Myofascial release;Manual Lymphatic Drainage (MLD);Passive ROM    Myofascial Release  Longitudinal along the L lateral breast and medial forearm to the elbow. Myofascial release across the breast and L lateral trunk wall with shifting noted and sudden release of cord in the L breast from the lateral trunk wall as  demonstrated by significant change in definition of cord into the breast.     Manual Lymphatic Drainage (MLD)  In supine: MLD performed to L brachium and breast, short neck, swimming in the terminus, Bil axillary and L inguinal nodes, anterior inter-axillary anastomosis, L axillo-inguinal anastomosis, Superior breast and inferior breast toward anastomosis then re-worked anastomosis, medial to lateral L brachium, lateral L brachium, L shoulder, re-worked all surfaces; deep abdominals.     Passive ROM  into flexion/abduction with STM and myofascial release as well as scapular blocking to prevent excessive scapular movement.              PT Education - 07/22/19 1031    Education Details  Pt will continue with her exercises and pumping at home.    Person(s) Educated  Patient    Methods  Explanation    Comprehension  Verbalized understanding           PT Long Term Goals - 06/24/19 1357      PT LONG TERM GOAL #1   Title  Pt will be independent in self MLD and use of compression to manage fullness in left upper quadrant    Time  8    Period  Weeks    Status  Achieved      PT LONG TERM GOAL #2   Title  Pt will have 150 degrees of left shoulder flexion and abduction so that she can return to normal activities    Time  8    Period  Weeks    Status  On-going      PT LONG TERM GOAL #3   Title  Pt will report she is able to do her job that involves manual work with both arms with no increase in pain or cording    Time  8    Period  Weeks    Status  On-going      PT LONG TERM GOAL #4   Title  Pt will have decrease in visual and palpable axillary cording by 50%  per photo and verbal report    Time  8    Period  Weeks    Status  On-going            Plan - 07/22/19 2841    Clinical Impression Statement  Pt cording continues to improve; with myofascial release along the inferior/lateral breast and lateral trunk wall shifting of the fascia was felt along with significant increase in definition of cording into the L lateral breast. This demonstrates decreased adhesions to the trunk wall. Pt reports decreased stiffness following her physical therapy session. MLD wa sperformed prior to STM/myofascial release and then re-worked after. Pt will benefit from continued POC at this time.    Personal Factors and Comorbidities  Comorbidity 2    Comorbidities  breast surgery with seroma. previous history of increased scarring after other surgeries    Examination-Activity Limitations  Reach Overhead    Rehab Potential  Good    PT Frequency  3x / week    PT Duration  8 weeks    PT Treatment/Interventions  ADLs/Self Care Home Management;Therapeutic exercise;Orthotic Fit/Training;Patient/family education;Manual techniques;Manual lymph drainage;Passive range of motion;Scar mobilization;Taping;Functional mobility training;DME  Instruction;Therapeutic activities;Neuromuscular re-education;Dry needling    PT Next Visit Plan  as needed , Work on L teres major/minor, serratus and latissimus wing on the L, focus on MLD of the L breast/arm, myofascial release for cording    PT Home Exercise Plan  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    Consulted and Agree with Plan of Care  Patient       Patient will benefit from skilled therapeutic intervention in order to improve the following deficits and impairments:  Pain, Impaired UE functional use, Impaired flexibility, Increased fascial restricitons, Decreased strength, Decreased knowledge of use of DME, Decreased range of motion, Decreased scar mobility, Increased edema, Impaired perceived functional ability, Decreased skin integrity, Postural dysfunction  Visit Diagnosis: Aftercare following surgery for neoplasm  Stiffness of left shoulder, not elsewhere classified  Muscle weakness (generalized)  Acute pain of left shoulder     Problem List Patient Active Problem List   Diagnosis Date Noted  . Numbness 02/04/2016  . Cognitive changes 02/04/2016  . Other fatigue 02/04/2016    Claudia Desanctis, PT 07/22/2019, 10:39 AM  [facility] [address] [place], [place], [address] Phone: [phone]   Fax:  [phone]  Name: Sheena Jarvis MRN: [id number] Date of Birth: [birth year]

## 2019-07-27 ENCOUNTER — Ambulatory Visit: Payer: Federal, State, Local not specified - PPO

## 2019-07-27 ENCOUNTER — Other Ambulatory Visit: Payer: Self-pay

## 2019-07-27 DIAGNOSIS — M25512 Pain in left shoulder: Secondary | ICD-10-CM

## 2019-07-27 DIAGNOSIS — M6281 Muscle weakness (generalized): Secondary | ICD-10-CM | POA: Diagnosis not present

## 2019-07-27 DIAGNOSIS — Z483 Aftercare following surgery for neoplasm: Secondary | ICD-10-CM

## 2019-07-27 DIAGNOSIS — M25612 Stiffness of left shoulder, not elsewhere classified: Secondary | ICD-10-CM | POA: Diagnosis not present

## 2019-07-27 NOTE — Therapy (Signed)
[facility] [address] [place], [place], [address] Phone: [phone]   Fax:  [phone]  Physical Therapy Treatment  Patient Details  Name: Sheena Jarvis MRN: [id number] Date of Birth: 1971-02-03 Referring Provider (PT): Dr.Byerly    Encounter Date: 07/27/2019  PT End of Session - 07/27/19 0807    Visit Number  42    Number of Visits  53    Date for PT Re-Evaluation  09/06/19    PT Start Time  0802    PT Stop Time  0902    PT Time Calculation (min)  60 min    Activity Tolerance  Patient tolerated treatment well    Behavior During Therapy  [facility] for tasks assessed/performed       Past Medical History:  Diagnosis Date  . Pneumonia   . PONV (postoperative nausea and vomiting)   . Vision abnormalities     Past Surgical History:  Procedure Laterality Date  . APPENDECTOMY    . BREAST CYST ASPIRATION Left   . HIP ARTHROSCOPY    . MASS EXCISION Left 02/10/2019   Procedure: EXCISION OF LEFT BREAST MASS;  Surgeon: Almond Lint, MD;  Location: [facility];  Service: General;  Laterality: Left;  . SCAR REVISION Left 06/22/2019   Procedure: LEFT AXILLARY CONTRACTURE RELEASE;  Surgeon: Almond Lint, MD;  Location: [facility];  Service: General;  Laterality: Left;    There were no vitals filed for this visit.  Subjective Assessment - 07/27/19 0808    Subjective  Pt states that this is the longest she has gone without swelling in her L breast blowing up. Pt reprots that doing all of the things she has been doing have helped with the pain.    Pertinent History  Pt has removed of benign breast mass on 02/10/2019 with complication of seroma and clogged drain and possible infection.  She also developed lymphedema in her left breast and axillary cording.  She receieved extensive PT with MLD, compression, manual therapy and dry needling, but had a significant persistent cord that was painful and limited  shoulder ROM She underwent and incision of that contracted scar tissue on 06/22/2019 that included one node and develped a seroma after that.    Patient Stated Goals  To get her arm back to normal    Currently in Pain?  No/denies    Pain Score  0-No pain         [facility] PT Assessment - 07/27/19 0001      AROM   Left Shoulder Flexion  160 Degrees    Left Shoulder ABduction  135 Degrees        LYMPHEDEMA/ONCOLOGY QUESTIONNAIRE - 07/27/19 0902      Left Upper Extremity Lymphedema   15 cm Proximal to Olecranon Process  22.5 cm    10 cm Proximal to Olecranon Process  21.7 cm    Olecranon Process  20.7 cm    15 cm Proximal to Ulnar Styloid Process  18.5 cm    Just Proximal to Ulnar Styloid Process  14.4 cm    Across Hand at [facility]  16.5 cm    At [place] of 2nd Digit  5.4 cm                [facility] Adult PT Treatment/Exercise - 07/27/19 0001      Manual Therapy   Manual Therapy  Myofascial release;Manual Lymphatic Drainage (MLD);Passive ROM;Joint mobilization    Joint Mobilization  posterior grade III/IV humeral head 8x8    Soft tissue mobilization  Prone STM and TpR to the infraspinatus with good relelase after STM.     Myofascial Release  Longitudinal along the L lateral breast and medial forearm to the elbow. Myofascial release across the breast and L lateral trunk wall and then across the superior anterior chest wall into the shoulder and on the posterior side with minimal mobility this session.     Manual Lymphatic Drainage (MLD)  In supine: MLD performed to L brachium and breast, short neck, swimming in the terminus, Bil axillary and L inguinal nodes, anterior inter-axillary anastomosis, L axillo-inguinal anastomosis, Superior breast and inferior breast toward anastomosis then re-worked anastomosis, medial to lateral L brachium, lateral L brachium, L shoulder, re-worked all surfaces.    Passive ROM  P/ROM into flexion/abduction and abduction/external rotation with  myofascial release.              PT Education - 07/27/19 0925    Education Details  Pt is going to [place] and is bringing her pump. She will continue with exercises, pumping and wearing compression.    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 Myofascial release performed along the trunk and brachium; minimal results following myofascial release today. STM/TpR performed at the infraspinatus with slight release and posterior mobilizations of the L humerus on the glenoid fossa due to pt reporting posterior shoulder impingment; minimal improvement following treatment. Pt will continue to work on myofascial release at home and pumping while on vacation. Pt continues with decreased fluid in the L breast and has decreased circumferential measurements in the L brachium. Pt will benefit from continued POC at this time.    Personal Factors and Comorbidities  Comorbidity 2     Comorbidities  breast surgery with seroma. previous history of increased scarring after other surgeries    Examination-Activity Limitations  Reach Overhead    Rehab Potential  Good    PT Frequency  3x / week    PT Duration  8 weeks    PT Treatment/Interventions  ADLs/Self Care Home Management;Therapeutic exercise;Orthotic Fit/Training;Patient/family education;Manual techniques;Manual lymph drainage;Passive range of motion;Scar mobilization;Taping;Functional mobility training;DME Instruction;Therapeutic activities;Neuromuscular re-education;Dry needling    PT Next Visit Plan  as needed , Work on L teres major/minor, serratus and latissimus wing on the L, focus on MLD of the L breast/arm, myofascial release for cording    PT Home Exercise Plan  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    Consulted and Agree with Plan of Care  Patient       Patient will benefit from skilled therapeutic intervention in order to improve the following deficits and impairments:  Pain, Impaired UE functional use, Impaired flexibility, Increased fascial restricitons, Decreased strength, Decreased knowledge of use of DME, Decreased range of motion, Decreased scar mobility, Increased edema, Impaired perceived functional ability, Decreased skin integrity, Postural dysfunction  Visit Diagnosis: Aftercare following surgery for neoplasm  Stiffness of left shoulder, not elsewhere classified  Muscle weakness (generalized)  Acute pain of left shoulder     Problem List Patient Active Problem List   Diagnosis Date Noted  . Numbness 02/04/2016  . Cognitive changes 02/04/2016  . Other fatigue 02/04/2016    Ander Purpura, PT 07/27/2019, 10:07 AM  [place], [place], [address] Phone: [phone]   Fax:  [phone]  Name: Sheena Jarvis MRN: [id number] Date of Birth:  05-17-1970

## 2019-08-03 ENCOUNTER — Ambulatory Visit: Payer: Federal, State, Local not specified - PPO

## 2019-08-08 ENCOUNTER — Ambulatory Visit: Payer: Federal, State, Local not specified - PPO | Attending: General Surgery

## 2019-08-08 ENCOUNTER — Other Ambulatory Visit: Payer: Self-pay

## 2019-08-08 DIAGNOSIS — M6281 Muscle weakness (generalized): Secondary | ICD-10-CM | POA: Diagnosis not present

## 2019-08-08 DIAGNOSIS — M25612 Stiffness of left shoulder, not elsewhere classified: Secondary | ICD-10-CM | POA: Insufficient documentation

## 2019-08-08 DIAGNOSIS — M25512 Pain in left shoulder: Secondary | ICD-10-CM | POA: Diagnosis not present

## 2019-08-08 DIAGNOSIS — Z483 Aftercare following surgery for neoplasm: Secondary | ICD-10-CM | POA: Insufficient documentation

## 2019-08-08 NOTE — Therapy (Signed)
[facility] [address] [place], [place], [address] Phone: [phone]   Fax:  [phone]  Physical Therapy Treatment  Patient Details  Name: Sheena Jarvis MRN: [id number] Date of Birth: 1970-11-23 Referring Provider (PT): Dr.Byerly    Encounter Date: 08/08/2019  PT End of Session - 08/08/19 0806    Visit Number  43    Number of Visits  53    Date for PT Re-Evaluation  09/06/19    PT Start Time  0805    PT Stop Time  0900    PT Time Calculation (min)  55 min    Activity Tolerance  Patient tolerated treatment well    Behavior During Therapy  [facility] for tasks assessed/performed       Past Medical History:  Diagnosis Date  . Pneumonia   . PONV (postoperative nausea and vomiting)   . Vision abnormalities     Past Surgical History:  Procedure Laterality Date  . APPENDECTOMY    . BREAST CYST ASPIRATION Left   . HIP ARTHROSCOPY    . MASS EXCISION Left 02/10/2019   Procedure: EXCISION OF LEFT BREAST MASS;  Surgeon: Almond Lint, MD;  Location: [facility];  Service: General;  Laterality: Left;  . SCAR REVISION Left 06/22/2019   Procedure: LEFT AXILLARY CONTRACTURE RELEASE;  Surgeon: Almond Lint, MD;  Location: [facility];  Service: General;  Laterality: Left;    There were no vitals filed for this visit.  Subjective Assessment - 08/08/19 0806    Subjective  Pt states that she was able to stay at the [facility] all day in [facility] without significant swelling in her L breast and UE    Pertinent History  Pt has removed of benign breast mass on 02/10/2019 with complication of seroma and clogged drain and possible infection.  She also developed lymphedema in her left breast and axillary cording.  She receieved extensive PT with MLD, compression, manual therapy and dry needling, but had a significant persistent cord that was painful and limited shoulder ROM She underwent and incision of that contracted scar  tissue on 06/22/2019 that included one node and develped a seroma after that.    Patient Stated Goals  To get her arm back to normal    Currently in Pain?  No/denies    Pain Score  0-No pain         [facility] PT Assessment - 08/08/19 0001      AROM   Left Shoulder Flexion  167 Degrees    Left Shoulder ABduction  150 Degrees        LYMPHEDEMA/ONCOLOGY QUESTIONNAIRE - 08/08/19 0816      Left Upper Extremity Lymphedema   15 cm Proximal to Olecranon Process  22.2 cm    10 cm Proximal to Olecranon Process  22 cm    Olecranon Process  20 cm    15 cm Proximal to Ulnar Styloid Process  19.3 cm    Just Proximal to Ulnar Styloid Process  14.1 cm    Across Hand at [facility]  16.8 cm    At [place] of 2nd Digit  5.6 cm                [facility] Adult PT Treatment/Exercise - 08/08/19 0001      Manual Therapy   Manual Therapy  Myofascial release;Passive ROM    Myofascial Release  Cross hand myofascial release along the upper chest into the anterior shoulders from slightly  L of the sternum to the medial brachium and alont he lateral L trunk as well as longitudinal  myofascial release in the L brachium down to the elbow with cording noted at the anterior deltoid down into the medial brachium to between the bicep/tricep area.     Passive ROM  P/ROM into flexion/abduction with myofascial release.              PT Education - 08/08/19 0907    Education Details  Pt will continue pumping and wearing compression at home. Discussed the garments that pt received in the mail. The swell spots are too large and the Jovipak is too large. [employer] rep was contacted for smaller sizes. Her compression bra fits great and is providing excellent compression.    Person(s) Educated  Patient    Methods  Explanation    Comprehension  Verbalized understanding          PT Long Term Goals - 06/24/19 1357      PT LONG TERM GOAL #1   Title  Pt will be independent in self MLD and use of compression to  manage fullness in left upper quadrant    Time  8    Period  Weeks    Status  Achieved      PT LONG TERM GOAL #2   Title  Pt will have 150 degrees of left shoulder flexion and abduction so that she can return to normal activities    Time  8    Period  Weeks    Status  On-going      PT LONG TERM GOAL #3   Title  Pt will report she is able to do her job that involves manual work with both arms with no increase in pain or cording    Time  8    Period  Weeks    Status  On-going      PT LONG TERM GOAL #4   Title  Pt will have decrease in visual and palpable axillary cording by 50%  per photo and verbal report    Time  8    Period  Weeks    Status  On-going            Plan - 08/08/19 0805    Clinical Impression Statement  Pt presents after taking 1 week off due to vacation with no significant changes in LUE circumferential measurements and improved ROM in the L shoulder. She continues with tight fascial planes in the anterior chest and cording down into the L brachium and along the medial and lateral borders of the breast. Discussed compression garments that pt received and [employer] rep was contacted for smaller sizes for swell spots and jovipak. Pt will benefit from continued POC at this time.    Personal Factors and Comorbidities  Comorbidity 2    Comorbidities  breast surgery with seroma. previous history of increased scarring after other surgeries    Examination-Activity Limitations  Reach Overhead    Rehab Potential  Good    PT Frequency  3x / week    PT Duration  8 weeks    PT Treatment/Interventions  ADLs/Self Care Home Management;Therapeutic exercise;Orthotic Fit/Training;Patient/family education;Manual techniques;Manual lymph drainage;Passive range of motion;Scar mobilization;Taping;Functional mobility training;DME Instruction;Therapeutic activities;Neuromuscular re-education;Dry needling    PT Next Visit Plan  as needed , Work on L teres major/minor, serratus and latissimus  wing on the L, focus on MLD of the L breast/arm, myofascial release for cording    PT  Home Exercise Plan  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    Consulted and Agree with Plan of Care  Patient       Patient will benefit from skilled therapeutic intervention in order to improve the following deficits and impairments:  Pain, Impaired UE functional use, Impaired flexibility, Increased fascial restricitons, Decreased strength, Decreased knowledge of use of DME, Decreased range of motion, Decreased scar mobility, Increased edema, Impaired perceived functional ability, Decreased skin integrity, Postural dysfunction  Visit Diagnosis: Aftercare following surgery for neoplasm  Stiffness of left shoulder, not elsewhere classified  Muscle weakness (generalized)  Acute pain of left shoulder     Problem List Patient Active Problem List   Diagnosis Date Noted  . Numbness 02/04/2016  . Cognitive changes 02/04/2016  . Other fatigue 02/04/2016    Claudia Desanctis, PT 08/08/2019, 9:10 AM  [facility] [address] [place], [place], [address] Phone: [phone]   Fax:  [phone]  Name: RANIYAH CURENTON MRN: [id number] Date of Birth: July 09, 1970

## 2019-08-09 ENCOUNTER — Ambulatory Visit: Payer: Federal, State, Local not specified - PPO

## 2019-08-09 DIAGNOSIS — M6281 Muscle weakness (generalized): Secondary | ICD-10-CM

## 2019-08-09 DIAGNOSIS — M25512 Pain in left shoulder: Secondary | ICD-10-CM

## 2019-08-09 DIAGNOSIS — Z483 Aftercare following surgery for neoplasm: Secondary | ICD-10-CM

## 2019-08-09 DIAGNOSIS — M25612 Stiffness of left shoulder, not elsewhere classified: Secondary | ICD-10-CM

## 2019-08-09 NOTE — Therapy (Signed)
[place], [place], [address] Phone: [phone]   Fax:  [phone]  Physical Therapy Treatment  Patient Details  Name: Sheena Jarvis MRN: [id number] Date of Birth: 19-Feb-1971 Referring Provider (PT): Dr.Byerly    Encounter Date: 08/09/2019  PT End of Session - 08/09/19 0935    Visit Number  44    Number of Visits  53    Date for PT Re-Evaluation  09/06/19    PT Start Time  0804    PT Stop Time  0900    PT Time Calculation (min)  56 min    Activity Tolerance  Patient tolerated treatment well    Behavior During Therapy  [facility] for tasks assessed/performed       Past Medical History:  Diagnosis Date  . Pneumonia   . PONV (postoperative nausea and vomiting)   . Vision abnormalities     Past Surgical History:  Procedure Laterality Date  . APPENDECTOMY    . BREAST CYST ASPIRATION Left   . HIP ARTHROSCOPY    . MASS EXCISION Left 02/10/2019   Procedure: EXCISION OF LEFT BREAST MASS;  Surgeon: Stark Klein, MD;  Location: [facility];  Service: General;  Laterality: Left;  . SCAR REVISION Left 06/22/2019   Procedure: LEFT AXILLARY CONTRACTURE RELEASE;  Surgeon: Stark Klein, MD;  Location: [facility];  Service: General;  Laterality: Left;    There were no vitals filed for this visit.      [facility] PT Assessment - 08/09/19 0001      AROM   Left Shoulder ABduction  155 Degrees                   [facility] Adult PT Treatment/Exercise - 08/09/19 0001      Moist Heat Therapy   Number Minutes Moist Heat  10 Minutes    Moist Heat Location  Other (comment)   during MLD to the medial L brachium and lateral L breast     Manual Therapy   Manual Therapy  Myofascial release;Passive ROM    Myofascial Release  Cross hand myofascial release along the upper chest into the anterior shoulders from slightly L of the sternum to the medial brachium and along the lateral L  trunk as well as longitudinal  myofascial release in the L brachium down to the elbow (extra time spent at this area) with cording noted at the anterior deltoid down into the medial brachium to between the bicep/tricep area.     Manual Lymphatic Drainage (MLD)  In supine: MLD performed to L brachium and breast, short neck, swimming in the terminus, Bil axillary and L inguinal nodes, anterior inter-axillary anastomosis, L axillo-inguinal anastomosis, Superior breast and inferior breast toward anastomosis then re-worked anastomosis, medial to lateral L brachium, lateral L brachium, L shoulder, re-worked all surfaces.    Passive ROM  P/ROM into flexion/abduction with myofascial release.              PT Education - 08/09/19 0933    Education Details  Pt will continue with pumping and compressionat home. She continues to perform exercises at home.    Person(s) Educated  Patient    Methods  Explanation    Comprehension  Verbalized understanding          PT Long Term Goals - 06/24/19 1357      PT LONG TERM GOAL #1   Title  Pt will be independent in self MLD and  use of compression to manage fullness in left upper quadrant    Time  8    Period  Weeks    Status  Achieved      PT LONG TERM GOAL #2   Title  Pt will have 150 degrees of left shoulder flexion and abduction so that she can return to normal activities    Time  8    Period  Weeks    Status  On-going      PT LONG TERM GOAL #3   Title  Pt will report she is able to do her job that involves manual work with both arms with no increase in pain or cording    Time  8    Period  Weeks    Status  On-going      PT LONG TERM GOAL #4   Title  Pt will have decrease in visual and palpable axillary cording by 50%  per photo and verbal report    Time  8    Period  Weeks    Status  On-going            Plan - 08/09/19 0936    Clinical Impression Statement  Cording continues in the anterior chest, medial breast to lateral breast,  into the medial L brachium; extra time spent here today with longitudinal myofascial release mixed with cross hand myofascial release in the fascial planes to decrease tightness. Pt demonstrates 5 degree improvement in L shoulder abduction following manual therapy. Pt will benefit from continued POC at this time.    Personal Factors and Comorbidities  Comorbidity 2    Comorbidities  breast surgery with seroma. previous history of increased scarring after other surgeries    Examination-Activity Limitations  Reach Overhead    Stability/Clinical Decision Making  Stable/Uncomplicated    Rehab Potential  Good    PT Frequency  3x / week    PT Duration  8 weeks    PT Treatment/Interventions  ADLs/Self Care Home Management;Therapeutic exercise;Orthotic Fit/Training;Patient/family education;Manual techniques;Manual lymph drainage;Passive range of motion;Scar mobilization;Taping;Functional mobility training;DME Instruction;Therapeutic activities;Neuromuscular re-education;Dry needling    PT Next Visit Plan  as needed , Work on L teres major/minor, serratus and latissimus wing on the L, focus on MLD of the L breast/arm, myofascial release for cording    PT Home Exercise Plan  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       Patient will benefit from skilled therapeutic intervention in order to improve the following deficits and impairments:  Pain, Impaired UE functional use, Impaired flexibility, Increased fascial restricitons, Decreased strength, Decreased knowledge of use of DME, Decreased range of motion, Decreased scar mobility, Increased edema, Impaired perceived functional ability, Decreased skin integrity, Postural dysfunction  Visit Diagnosis: Aftercare following surgery for neoplasm  Stiffness of left shoulder, not elsewhere classified  Muscle weakness (generalized)  Acute pain of left shoulder     Problem List Patient Active Problem List    Diagnosis Date Noted  . Numbness 02/04/2016  . Cognitive changes 02/04/2016  . Other fatigue 02/04/2016    Claudia Desanctis, PT 08/09/2019, 9:44 AM  [facility] [address] [place], [place], [address] Phone: [phone]   Fax:  [phone]  Name: Sheena Jarvis MRN: [id number] Date of Birth: 11/26/1970

## 2019-08-11 ENCOUNTER — Other Ambulatory Visit: Payer: Self-pay

## 2019-08-11 ENCOUNTER — Ambulatory Visit: Payer: Federal, State, Local not specified - PPO

## 2019-08-11 DIAGNOSIS — M6281 Muscle weakness (generalized): Secondary | ICD-10-CM

## 2019-08-11 DIAGNOSIS — M25612 Stiffness of left shoulder, not elsewhere classified: Secondary | ICD-10-CM

## 2019-08-11 DIAGNOSIS — Z483 Aftercare following surgery for neoplasm: Secondary | ICD-10-CM | POA: Diagnosis not present

## 2019-08-11 DIAGNOSIS — M25512 Pain in left shoulder: Secondary | ICD-10-CM | POA: Diagnosis not present

## 2019-08-11 NOTE — Therapy (Signed)
[facility] [address] [place], [place], [address] Phone: [phone]   Fax:  [phone]  Physical Therapy Treatment  Patient Details  Name: Sheena Jarvis MRN: [id number] Date of Birth: May 26, 1970 Referring Provider (PT): Dr.Byerly    Encounter Date: 08/11/2019  PT End of Session - 08/11/19 0812    Visit Number  45    Number of Visits  53    Date for PT Re-Evaluation  09/06/19    PT Start Time  0804    PT Stop Time  0900    PT Time Calculation (min)  56 min    Activity Tolerance  Patient tolerated treatment well    Behavior During Therapy  [facility] for tasks assessed/performed       Past Medical History:  Diagnosis Date  . Pneumonia   . PONV (postoperative nausea and vomiting)   . Vision abnormalities     Past Surgical History:  Procedure Laterality Date  . APPENDECTOMY    . BREAST CYST ASPIRATION Left   . HIP ARTHROSCOPY    . MASS EXCISION Left 02/10/2019   Procedure: EXCISION OF LEFT BREAST MASS;  Surgeon: Almond Lint, MD;  Location: [facility];  Service: General;  Laterality: Left;  . SCAR REVISION Left 06/22/2019   Procedure: LEFT AXILLARY CONTRACTURE RELEASE;  Surgeon: Almond Lint, MD;  Location: [facility];  Service: General;  Laterality: Left;    There were no vitals filed for this visit.  Subjective Assessment - 08/11/19 0812    Subjective  Pt states she brought the things she would like to return for compression due to improper fit. She feels okay today with no pain.    Pertinent History  Pt has removed of benign breast mass on 02/10/2019 with complication of seroma and clogged drain and possible infection.  She also developed lymphedema in her left breast and axillary cording.  She receieved extensive PT with MLD, compression, manual therapy and dry needling, but had a significant persistent cord that was painful and limited shoulder ROM She underwent and incision of that  contracted scar tissue on 06/22/2019 that included one node and develped a seroma after that.    Patient Stated Goals  To get her arm back to normal    Currently in Pain?  No/denies    Pain Score  0-No pain         [facility] PT Assessment - 08/11/19 0001      AROM   Left Shoulder Flexion  160 Degrees    Left Shoulder ABduction  160 Degrees                   [facility] Adult PT Treatment/Exercise - 08/11/19 0001      Manual Therapy   Manual Therapy  Myofascial release;Passive ROM    Myofascial Release  Cross hand myofascial release along the anterior/superior L chest wall to the anterior deltoid, from the anterior deltoid into the distal medial brachium (into abduction and extension with shoulder propped on rolled towel, from the axilla down the L lateral trunk in abduction.     Manual Lymphatic Drainage (MLD)  In supine: MLD performed to L brachium and breast, short neck, swimming in the terminus, Bil axillary and L inguinal nodes, anterior inter-axillary anastomosis, L axillo-inguinal anastomosis, Superior breast and inferior breast toward anastomosis then re-worked anastomosis, medial to lateral L brachium, lateral L brachium, L shoulder, re-worked all surfaces.    Passive ROM  P/ROM into flexion/abduction  with myofascial release and with end range stretch with scapular block with abduction.              PT Education - 08/11/19 1007    Education Details  Pt will continue with proprioceptive and scapular strengthening at home as well as continueing to wear compression and pump.    Person(s) Educated  Patient    Methods  Explanation    Comprehension  Verbalized understanding          PT Long Term Goals - 06/24/19 1357      PT LONG TERM GOAL #1   Title  Pt will be independent in self MLD and use of compression to manage fullness in left upper quadrant    Time  8    Period  Weeks    Status  Achieved      PT LONG TERM GOAL #2   Title  Pt will have 150 degrees of left  shoulder flexion and abduction so that she can return to normal activities    Time  8    Period  Weeks    Status  On-going      PT LONG TERM GOAL #3   Title  Pt will report she is able to do her job that involves manual work with both arms with no increase in pain or cording    Time  8    Period  Weeks    Status  On-going      PT LONG TERM GOAL #4   Title  Pt will have decrease in visual and palpable axillary cording by 50%  per photo and verbal report    Time  8    Period  Weeks    Status  On-going            Plan - 08/11/19 0811    Clinical Impression Statement  This session working on myofascial release in the anterior chest/lateral chest wall on the L. Cording continues in the L axilla down to the inferior lateral breast and along the edges of the anterior deltoid that pt reports radiates into the medial border of the L breast especially into abduction and extension; minimal improvement in the cording along the edges of the anterior deltoid following myofascial release. MLD was performed prior to myofascial release in order to decrease risk for fluid build up; she continues with decreased fluid in her L breast with daily use of vasopneumatic pump. Pt gained 5 degrees of abduction by end of session gaining from her last session. In sitting pt performed Abdct with Bil shoulders with evident decrease from L compared to R and cording noted in the L forearm continues. Pt will benefit from continued POC at    Personal Factors and Comorbidities  Comorbidity 2    Comorbidities  breast surgery with seroma. previous history of increased scarring after other surgeries    Examination-Activity Limitations  Reach Overhead    Rehab Potential  Good    PT Frequency  3x / week    PT Duration  8 weeks    PT Treatment/Interventions  ADLs/Self Care Home Management;Therapeutic exercise;Orthotic Fit/Training;Patient/family education;Manual techniques;Manual lymph drainage;Passive range of motion;Scar  mobilization;Taping;Functional mobility training;DME Instruction;Therapeutic activities;Neuromuscular re-education;Dry needling    PT Next Visit Plan  work on posterior glides of the L humeral head and cording in the L forearm , Work on L teres major/minor, serratus and latissimus wing on the L, focus on MLD of the L breast/arm, myofascial release for cording  PT Home Exercise Plan  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    Consulted and Agree with Plan of Care  Patient       Patient will benefit from skilled therapeutic intervention in order to improve the following deficits and impairments:  Pain, Impaired UE functional use, Impaired flexibility, Increased fascial restricitons, Decreased strength, Decreased knowledge of use of DME, Decreased range of motion, Decreased scar mobility, Increased edema, Impaired perceived functional ability, Decreased skin integrity, Postural dysfunction  Visit Diagnosis: Aftercare following surgery for neoplasm  Stiffness of left shoulder, not elsewhere classified  Muscle weakness (generalized)  Acute pain of left shoulder     Problem List Patient Active Problem List   Diagnosis Date Noted  . Numbness 02/04/2016  . Cognitive changes 02/04/2016  . Other fatigue 02/04/2016    Claudia Desanctis , PT 08/11/2019, 10:19 AM  [facility] [address] [place], [place], [address] Phone: [phone]   Fax:  [phone]  Name: INDIA JOLIN MRN: [id number] Date of Birth: 01/31/1971

## 2019-08-15 ENCOUNTER — Other Ambulatory Visit: Payer: Self-pay

## 2019-08-15 ENCOUNTER — Ambulatory Visit: Payer: Federal, State, Local not specified - PPO

## 2019-08-15 DIAGNOSIS — N920 Excessive and frequent menstruation with regular cycle: Secondary | ICD-10-CM | POA: Diagnosis not present

## 2019-08-15 DIAGNOSIS — M25612 Stiffness of left shoulder, not elsewhere classified: Secondary | ICD-10-CM | POA: Diagnosis not present

## 2019-08-15 DIAGNOSIS — M6281 Muscle weakness (generalized): Secondary | ICD-10-CM | POA: Diagnosis not present

## 2019-08-15 DIAGNOSIS — Z483 Aftercare following surgery for neoplasm: Secondary | ICD-10-CM | POA: Diagnosis not present

## 2019-08-15 DIAGNOSIS — E611 Iron deficiency: Secondary | ICD-10-CM | POA: Diagnosis not present

## 2019-08-15 DIAGNOSIS — N951 Menopausal and female climacteric states: Secondary | ICD-10-CM | POA: Diagnosis not present

## 2019-08-15 DIAGNOSIS — M25512 Pain in left shoulder: Secondary | ICD-10-CM | POA: Diagnosis not present

## 2019-08-15 DIAGNOSIS — E559 Vitamin D deficiency, unspecified: Secondary | ICD-10-CM | POA: Diagnosis not present

## 2019-08-15 NOTE — Therapy (Signed)
[place], [place], [address] Phone: [phone]   Fax:  [phone]  Physical Therapy Treatment  Patient Details  Name: Sheena Jarvis MRN: [id number] Date of Birth: 1970/06/15 Referring Provider (PT): Dr.Byerly    Encounter Date: 08/15/2019  PT End of Session - 08/15/19 1105    Visit Number  44    Number of Visits  66    Date for PT Re-Evaluation  09/06/19    PT Start Time  1104    PT Stop Time  1200    PT Time Calculation (min)  56 min    Activity Tolerance  Patient tolerated treatment well    Behavior During Therapy  [facility] for tasks assessed/performed       Past Medical History:  Diagnosis Date  . Pneumonia   . PONV (postoperative nausea and vomiting)   . Vision abnormalities     Past Surgical History:  Procedure Laterality Date  . APPENDECTOMY    . BREAST CYST ASPIRATION Left   . HIP ARTHROSCOPY    . MASS EXCISION Left 02/10/2019   Procedure: EXCISION OF LEFT BREAST MASS;  Surgeon: Stark Klein, MD;  Location: [place];  Service: General;  Laterality: Left;  . SCAR REVISION Left 06/22/2019   Procedure: LEFT AXILLARY CONTRACTURE RELEASE;  Surgeon: Stark Klein, MD;  Location: [place];  Service: General;  Laterality: Left;    There were no vitals filed for this visit.  Subjective Assessment - 08/15/19 1105    Subjective  Pt states that she did some yard work over the weekend and felt very fatigued in her LUE. She feels swollen today in her L breast and tight in her LUE.    Pertinent History  Pt has removed of benign breast mass on 26/01/4853 with complication of seroma and clogged drain and possible infection.  She also developed lymphedema in her left breast and axillary cording.  She receieved extensive PT with MLD, compression, manual therapy and dry needling, but had a significant persistent cord that was painful and limited shoulder ROM She underwent  and incision of that contracted scar tissue on 06/22/2019 that included one node and develped a seroma after that.                       [facility] Adult PT Treatment/Exercise - 08/15/19 0001      Moist Heat Therapy   Number Minutes Moist Heat  10 Minutes    Moist Heat Location  Other (comment)   over the lateral L breast, anterior deltoid, L brachium      Manual Therapy   Manual Therapy  Myofascial release;Passive ROM;Manual Lymphatic Drainage (MLD)    Myofascial Release  Cross hand myofascial release along the anterior and lateral chest wall, into the L brachium and down to the wrist. Cording noted at the mid brachium that has the most thickness this strand can be seen traversing along the inferior portion of the L clavicle down to the medial L breast. Scar tissue strands noted from the acromion down to the deltoid this session as well; as fascial tightness improves increased scarring is noted loosening from lower layers of tissue and pt ROM improves.     Manual Lymphatic Drainage (MLD)  In supine: MLD performed to L brachium and breast, short neck, swimming in the terminus, Bil axillary and L inguinal nodes, anterior inter-axillary anastomosis, L axillo-inguinal anastomosis, Superior breast and inferior breast toward anastomosis  then re-worked anastomosis, medial to lateral L brachium, lateral L brachium, L shoulder, re-worked all surfaces.    Passive ROM  P/ROM into flexion/abduction with myofascial release and with end range stretch with scapular block with abduction; continues with winging of the scapula into end range Abduction.              PT Education - 08/15/19 1620    Education Details  Pt will continue with proprioceptive training and strengthening of the scapula at home. She will continue with compression and pump    Person(s) Educated  Patient    Methods  Explanation    Comprehension  Verbalized understanding          PT Long Term Goals - 06/24/19 1357       PT LONG TERM GOAL #1   Title  Pt will be independent in self MLD and use of compression to manage fullness in left upper quadrant    Time  8    Period  Weeks    Status  Achieved      PT LONG TERM GOAL #2   Title  Pt will have 150 degrees of left shoulder flexion and abduction so that she can return to normal activities    Time  8    Period  Weeks    Status  On-going      PT LONG TERM GOAL #3   Title  Pt will report she is able to do her job that involves manual work with both arms with no increase in pain or cording    Time  8    Period  Weeks    Status  On-going      PT LONG TERM GOAL #4   Title  Pt will have decrease in visual and palpable axillary cording by 50%  per photo and verbal report    Time  8    Period  Weeks    Status  On-going            Plan - 08/15/19 1104    Clinical Impression Statement  Pt continues with fascial tightness and adhesions noted in her L anterior upper quadrant. As ROM in the L shoulder improves and fascial layers shift/move cording is noted in areas that pt has had difficulty with including cording was noted today form the anterior deltoid along the inferior aspect of the L calvicle down to the medial breast; pt has had difficulty with  full horizontal abduction. Cording was noted from the acromion down toward the deltoid and pt has had difficulty with anterior shift of the humeral head and continues with significant anterior/lateral shift of the scapula when in full abduction. This session was difficulty to get a good stretch on the cording noted in the antebrachium last session but myofascial release was able to be performed over above mentioned cording. MLD was performed while heat was on in order to faciliate fluid flow out of the area. Small "pop" was heard 2x while doing longitudnal strech from the dense cording in the lateral L breast to the dense cord in the mid medial L brachium. Pt stated she felt less tight by end of session. Pt will  benefit from continued POC at this time she has been reduced to 2x/week for possibly 4 weeks depending on progress.    Personal Factors and Comorbidities  Comorbidity 2    Comorbidities  breast surgery with seroma. previous history of increased scarring after other surgeries    Examination-Activity Limitations  Reach Overhead    Rehab Potential  Good    PT Frequency  3x / week    PT Duration  8 weeks    PT Treatment/Interventions  ADLs/Self Care Home Management;Therapeutic exercise;Orthotic Fit/Training;Patient/family education;Manual techniques;Manual lymph drainage;Passive range of motion;Scar mobilization;Taping;Functional mobility training;DME Instruction;Therapeutic activities;Neuromuscular re-education;Dry needling    PT Next Visit Plan  work on posterior glides of the L humeral head and cording in the L forearm , Work on L teres major/minor, serratus and latissimus wing on the L, focus on MLD of the L breast/arm, myofascial release for cording    PT Home Exercise Plan  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    Consulted and Agree with Plan of Care  Patient       Patient will benefit from skilled therapeutic intervention in order to improve the following deficits and impairments:  Pain, Impaired UE functional use, Impaired flexibility, Increased fascial restricitons, Decreased strength, Decreased knowledge of use of DME, Decreased range of motion, Decreased scar mobility, Increased edema, Impaired perceived functional ability, Decreased skin integrity, Postural dysfunction  Visit Diagnosis: Aftercare following surgery for neoplasm  Stiffness of left shoulder, not elsewhere classified  Muscle weakness (generalized)  Acute pain of left shoulder     Problem List Patient Active Problem List   Diagnosis Date Noted  . Numbness 02/04/2016  . Cognitive changes 02/04/2016  . Other fatigue 02/04/2016    Claudia Desanctis,  PT 08/15/2019, 4:29 PM  [facility] [address] [place], [place], [address] Phone: [phone]   Fax:  [phone]  Name: Sheena Jarvis MRN: [id number] Date of Birth: 03-10-1971

## 2019-08-18 ENCOUNTER — Ambulatory Visit: Payer: Federal, State, Local not specified - PPO

## 2019-08-18 ENCOUNTER — Other Ambulatory Visit: Payer: Self-pay

## 2019-08-18 DIAGNOSIS — Z483 Aftercare following surgery for neoplasm: Secondary | ICD-10-CM | POA: Diagnosis not present

## 2019-08-18 DIAGNOSIS — M25512 Pain in left shoulder: Secondary | ICD-10-CM | POA: Diagnosis not present

## 2019-08-18 DIAGNOSIS — M25612 Stiffness of left shoulder, not elsewhere classified: Secondary | ICD-10-CM | POA: Diagnosis not present

## 2019-08-18 DIAGNOSIS — M6281 Muscle weakness (generalized): Secondary | ICD-10-CM

## 2019-08-18 NOTE — Therapy (Signed)
[place], [place], [address] Phone: [phone]   Fax:  [phone]  Physical Therapy Treatment  Patient Details  Name: Sheena Jarvis MRN: [id number] Date of Birth: 1970-06-30 Referring Provider (PT): Dr.Byerly    Encounter Date: 08/18/2019  PT End of Session - 08/18/19 1740    Visit Number  38    Number of Visits  53    Date for PT Re-Evaluation  09/06/19    PT Start Time  2409    PT Stop Time  1710    PT Time Calculation (min)  55 min    Activity Tolerance  Patient tolerated treatment well    Behavior During Therapy  [facility] for tasks assessed/performed       Past Medical History:  Diagnosis Date  . Pneumonia   . PONV (postoperative nausea and vomiting)   . Vision abnormalities     Past Surgical History:  Procedure Laterality Date  . APPENDECTOMY    . BREAST CYST ASPIRATION Left   . HIP ARTHROSCOPY    . MASS EXCISION Left 02/10/2019   Procedure: EXCISION OF LEFT BREAST MASS;  Surgeon: Stark Klein, MD;  Location: [place];  Service: General;  Laterality: Left;  . SCAR REVISION Left 06/22/2019   Procedure: LEFT AXILLARY CONTRACTURE RELEASE;  Surgeon: Stark Klein, MD;  Location: [place];  Service: General;  Laterality: Left;    There were no vitals filed for this visit.  Subjective Assessment - 08/18/19 1742    Subjective  Pt states that she continues to feel that she is making progress with her ROM and tightness. She has some swelling today in her L breast and wore her foam with her compression bra.    Pertinent History  Pt has removed of benign breast mass on 73/09/3297 with complication of seroma and clogged drain and possible infection.  She also developed lymphedema in her left breast and axillary cording.  She receieved extensive PT with MLD, compression, manual therapy and dry needling, but had a significant persistent cord that was painful and limited  shoulder ROM She underwent and incision of that contracted scar tissue on 06/22/2019 that included one node and develped a seroma after that.    Patient Stated Goals  To get her arm back to normal    Currently in Pain?  No/denies    Pain Score  0-No pain                               PT Education - 08/18/19 1741    Education Details  Pt will continue with exercises at home, wearing her compression and using vasopneuatic pump. She was given her box of compression garments to return to [facility] and Melissa was contact to re-send shipping label.    Person(s) Educated  Patient    Methods  Explanation    Comprehension  Verbalized understanding          PT Long Term Goals - 06/24/19 1357      PT LONG TERM GOAL #1   Title  Pt will be independent in self MLD and use of compression to manage fullness in left upper quadrant    Time  8    Period  Weeks    Status  Achieved      PT LONG TERM GOAL #2   Title  Pt will have 150 degrees of left shoulder flexion  and abduction so that she can return to normal activities    Time  8    Period  Weeks    Status  On-going      PT LONG TERM GOAL #3   Title  Pt will report she is able to do her job that involves manual work with both arms with no increase in pain or cording    Time  8    Period  Weeks    Status  On-going      PT LONG TERM GOAL #4   Title  Pt will have decrease in visual and palpable axillary cording by 50%  per photo and verbal report    Time  8    Period  Weeks    Status  On-going            Plan - 08/18/19 1734    Clinical Impression Statement  Pt continues with fascial tightness and adhesions noted in her L anterior upper quadrant. Seroma noted in the superior/lateral aspect of the L breast that could potentially be resulting in fluid retention in the L breast; pt MD was notified. Decreased thickness of cording in the L lateral breast this session after her last session, but pt continues to feel  pulling in this area with myofascial stretch. She has reported that the tightness is moving up closer to her axilla from when she initially started coming to physical therapy. MLD was performed prior to myofascial release then re-worked following myofascial release .Myofascial release was performed over the anterior chest wall to the medial breast along the anterior deltoid, the lateral trunk from below the ribs to the axilla and from the axilla into the medial brachium. Pt is able to abduct her arms bilaterlaly with improve mobility, less elbow flexion on the L and no cording noted in the antebrachium. Pt will benefit frmo continued POC at this time.    Personal Factors and Comorbidities  Comorbidity 2    Comorbidities  breast surgery with seroma. previous history of increased scarring after other surgeries    Examination-Activity Limitations  Reach Overhead    Rehab Potential  Good    PT Frequency  3x / week    PT Duration  8 weeks    PT Treatment/Interventions  ADLs/Self Care Home Management;Therapeutic exercise;Orthotic Fit/Training;Patient/family education;Manual techniques;Manual lymph drainage;Passive range of motion;Scar mobilization;Taping;Functional mobility training;DME Instruction;Therapeutic activities;Neuromuscular re-education;Dry needling    PT Next Visit Plan  work on posterior glides of the L humeral head and cording in the L forearm , Work on L teres major/minor, serratus and latissimus wing on the L, focus on MLD of the L breast/arm, myofascial release for cording    PT Home Exercise Plan  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    Consulted and Agree with Plan of Care  Patient       Patient will benefit from skilled therapeutic intervention in order to improve the following deficits and impairments:  Pain, Impaired UE functional use, Impaired flexibility, Increased fascial restricitons, Decreased strength, Decreased knowledge of  use of DME, Decreased range of motion, Decreased scar mobility, Increased edema, Impaired perceived functional ability, Decreased skin integrity, Postural dysfunction  Visit Diagnosis: Aftercare following surgery for neoplasm  Stiffness of left shoulder, not elsewhere classified  Muscle weakness (generalized)  Acute pain of left shoulder     Problem List Patient Active Problem List   Diagnosis Date Noted  . Numbness 02/04/2016  .  Cognitive changes 02/04/2016  . Other fatigue 02/04/2016    Claudia Desanctis, PT 08/18/2019, 5:43 PM  [facility] [address] [place], [place], [address] Phone: [phone]   Fax:  [phone]  Name: LASHAWN BROMWELL MRN: [id number] Date of Birth: Jan 10, 1971

## 2019-08-23 ENCOUNTER — Ambulatory Visit: Payer: Federal, State, Local not specified - PPO

## 2019-08-23 ENCOUNTER — Other Ambulatory Visit: Payer: Self-pay

## 2019-08-23 DIAGNOSIS — M25512 Pain in left shoulder: Secondary | ICD-10-CM | POA: Diagnosis not present

## 2019-08-23 DIAGNOSIS — Z483 Aftercare following surgery for neoplasm: Secondary | ICD-10-CM | POA: Diagnosis not present

## 2019-08-23 DIAGNOSIS — M25612 Stiffness of left shoulder, not elsewhere classified: Secondary | ICD-10-CM

## 2019-08-23 DIAGNOSIS — M6281 Muscle weakness (generalized): Secondary | ICD-10-CM

## 2019-08-23 NOTE — Therapy (Signed)
[facility] [address] [place], [place], [address] Phone: [phone]   Fax:  [phone]  Physical Therapy Treatment  Patient Details  Name: Sheena Jarvis MRN: [id number] Date of Birth: Sep 08, 1970 Referring Provider (PT): Dr.Byerly    Encounter Date: 08/23/2019  PT End of Session - 08/23/19 0806    Visit Number  48    Number of Visits  53    Date for PT Re-Evaluation  09/06/19    PT Start Time  0805    PT Stop Time  0900    PT Time Calculation (min)  55 min    Activity Tolerance  Patient tolerated treatment well    Behavior During Therapy  [facility] for tasks assessed/performed       Past Medical History:  Diagnosis Date  . Pneumonia   . PONV (postoperative nausea and vomiting)   . Vision abnormalities     Past Surgical History:  Procedure Laterality Date  . APPENDECTOMY    . BREAST CYST ASPIRATION Left   . HIP ARTHROSCOPY    . MASS EXCISION Left 02/10/2019   Procedure: EXCISION OF LEFT BREAST MASS;  Surgeon: Almond Lint, MD;  Location: [facility];  Service: General;  Laterality: Left;  . SCAR REVISION Left 06/22/2019   Procedure: LEFT AXILLARY CONTRACTURE RELEASE;  Surgeon: Almond Lint, MD;  Location: [facility];  Service: General;  Laterality: Left;    There were no vitals filed for this visit.  Subjective Assessment - 08/23/19 0807    Subjective  Pt states that she felt pretty good over the weekend. She continues with tightness in her LUE but was able to return to horse back riding without being constantly reminded of her shoulder.    Pertinent History  Pt has removed of benign breast mass on 02/10/2019 with complication of seroma and clogged drain and possible infection.  She also developed lymphedema in her left breast and axillary cording.  She receieved extensive PT with MLD, compression, manual therapy and dry needling, but had a significant persistent cord that was painful and  limited shoulder ROM She underwent and incision of that contracted scar tissue on 06/22/2019 that included one node and develped a seroma after that.    Patient Stated Goals  To get her arm back to normal    Currently in Pain?  No/denies    Pain Score  0-No pain         [facility] PT Assessment - 08/23/19 0001      AROM   Left Shoulder ABduction  165 Degrees                   [facility] Adult PT Treatment/Exercise - 08/23/19 0001      Manual Therapy   Manual Therapy  Myofascial release;Passive ROM;Manual Lymphatic Drainage (MLD);Joint mobilization    Joint Mobilization  grade III posterior L humeral mobs, L clavicular mobs 8x8 due to reports of impingment and pt humeral head sliding anteriorly.     Myofascial Release  Cross hand and longitudinal myofascial release performed from the lateral L breast into the distal medial L brachium, from the L axilla down below the L lateral breast, from the mid medial brachium to the L lateral breast toward the areola and from the axilla and medial L brachium to the medial breast and superior anterior chest near sternum. Pt had improved ROM following this session into abduction but continues to feel a pull from below the L lateral  breast to the wrist.     Manual Lymphatic Drainage (MLD)  In supine: short neck, swimming in the terminus, bil shoulder collectors, bil axillary and inguinal nodes, anterior inter-axillary anastomosis, L axillo-inguinal anastomosis, superior and inferior breast toward corresponding anastomosis; re-worked surfaces     Passive ROM  into flexion/abduction             PT Education - 08/23/19 0920    Education Details  Pt will continue with exercises at home directly addressing stabilization at the L glenohumeral joint to prevent anterior displacement. She will continue with CDT at home.    Person(s) Educated  Patient    Methods  Explanation    Comprehension  Verbalized understanding          PT Long Term Goals -  06/24/19 1357      PT LONG TERM GOAL #1   Title  Pt will be independent in self MLD and use of compression to manage fullness in left upper quadrant    Time  8    Period  Weeks    Status  Achieved      PT LONG TERM GOAL #2   Title  Pt will have 150 degrees of left shoulder flexion and abduction so that she can return to normal activities    Time  8    Period  Weeks    Status  On-going      PT LONG TERM GOAL #3   Title  Pt will report she is able to do her job that involves manual work with both arms with no increase in pain or cording    Time  8    Period  Weeks    Status  On-going      PT LONG TERM GOAL #4   Title  Pt will have decrease in visual and palpable axillary cording by 50%  per photo and verbal report    Time  8    Period  Weeks    Status  On-going            Plan - 08/23/19 0806    Clinical Impression Statement  Pt continues with cording/fascial adhesions in the L anterior/lateral upper quadrant. Steady improvement in L shoulder A/ROM but pt continues with impingement symptoms and pulling from the inferior/lateral aspect of the L breast to her wrist. MLD was performed this session prior to myofascial release during Moist heat pack to the area and then re-worked following myofascial release in order to facilitate fluid flow out of the area. Pt will benefit from continued POC at this time.    Personal Factors and Comorbidities  Comorbidity 2    Comorbidities  breast surgery with seroma. previous history of increased scarring after other surgeries    Examination-Activity Limitations  Reach Overhead    Rehab Potential  Good    PT Frequency  3x / week    PT Duration  8 weeks    PT Treatment/Interventions  ADLs/Self Care Home Management;Therapeutic exercise;Orthotic Fit/Training;Patient/family education;Manual techniques;Manual lymph drainage;Passive range of motion;Scar mobilization;Taping;Functional mobility training;DME Instruction;Therapeutic  activities;Neuromuscular re-education;Dry needling    PT Next Visit Plan  work on posterior glides of the L humeral head and cording in the L forearm , Work on L teres major/minor, serratus and latissimus wing on the L, focus on MLD of the L breast/arm, myofascial release for cording    PT Home Exercise Plan  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    Consulted and Agree with Plan of Care  Patient       Patient will benefit from skilled therapeutic intervention in order to improve the following deficits and impairments:  Pain, Impaired UE functional use, Impaired flexibility, Increased fascial restricitons, Decreased strength, Decreased knowledge of use of DME, Decreased range of motion, Decreased scar mobility, Increased edema, Impaired perceived functional ability, Decreased skin integrity, Postural dysfunction  Visit Diagnosis: Aftercare following surgery for neoplasm  Stiffness of left shoulder, not elsewhere classified  Muscle weakness (generalized)  Acute pain of left shoulder     Problem List Patient Active Problem List   Diagnosis Date Noted  . Numbness 02/04/2016  . Cognitive changes 02/04/2016  . Other fatigue 02/04/2016    Ander Purpura, PT 08/23/2019, 9:27 AM  [place], [place], [address] Phone: [phone]   Fax:  [phone]  Name: Sheena Jarvis MRN: [id number] Date of Birth: Feb 10, 1971

## 2019-08-25 ENCOUNTER — Ambulatory Visit: Payer: Federal, State, Local not specified - PPO

## 2019-08-25 ENCOUNTER — Other Ambulatory Visit: Payer: Self-pay

## 2019-08-25 DIAGNOSIS — M6281 Muscle weakness (generalized): Secondary | ICD-10-CM | POA: Diagnosis not present

## 2019-08-25 DIAGNOSIS — M25512 Pain in left shoulder: Secondary | ICD-10-CM | POA: Diagnosis not present

## 2019-08-25 DIAGNOSIS — M25612 Stiffness of left shoulder, not elsewhere classified: Secondary | ICD-10-CM | POA: Diagnosis not present

## 2019-08-25 DIAGNOSIS — Z483 Aftercare following surgery for neoplasm: Secondary | ICD-10-CM | POA: Diagnosis not present

## 2019-08-25 NOTE — Therapy (Signed)
[facility] [address] [place], [place], [address] Phone: [phone]   Fax:  [phone]  Physical Therapy Treatment  Patient Details  Name: Sheena Jarvis MRN: [id number] Date of Birth: 06-01-1970 Referring Provider (PT): Dr.Byerly    Encounter Date: 08/25/2019  PT End of Session - 08/25/19 0808    Visit Number  49    Number of Visits  53    Date for PT Re-Evaluation  09/06/19    PT Start Time  0806    PT Stop Time  0900    PT Time Calculation (min)  54 min    Activity Tolerance  Patient tolerated treatment well    Behavior During Therapy  [facility] for tasks assessed/performed       Past Medical History:  Diagnosis Date  . Pneumonia   . PONV (postoperative nausea and vomiting)   . Vision abnormalities     Past Surgical History:  Procedure Laterality Date  . APPENDECTOMY    . BREAST CYST ASPIRATION Left   . HIP ARTHROSCOPY    . MASS EXCISION Left 02/10/2019   Procedure: EXCISION OF LEFT BREAST MASS;  Surgeon: Almond Lint, MD;  Location: [facility];  Service: General;  Laterality: Left;  . SCAR REVISION Left 06/22/2019   Procedure: LEFT AXILLARY CONTRACTURE RELEASE;  Surgeon: Almond Lint, MD;  [facility];  Service: General;  Laterality: Left;    There were no vitals filed for this visit.  Subjective Assessment - 08/25/19 0808    Subjective  Pt reports that she is feeling tight and heavy today in her L breast.    Pertinent History  Pt has removed of benign breast mass on 02/10/2019 with complication of seroma and clogged drain and possible infection.  She also developed lymphedema in her left breast and axillary cording.  She receieved extensive PT with MLD, compression, manual therapy and dry needling, but had a significant persistent cord that was painful and limited shoulder ROM She underwent and incision of that contracted scar tissue on 06/22/2019 that included one node and  develped a seroma after that.    Patient Stated Goals  To get her arm back to normal    Currently in Pain?  Yes    Pain Score  4     Pain Location  Breast    Pain Orientation  Left    Pain Descriptors / Indicators  Aching    Pain Type  Surgical pain    Pain Onset  More than a month ago    Pain Frequency  Constant                       [facility] Adult PT Treatment/Exercise - 08/25/19 0001      Manual Therapy   Manual Therapy  Myofascial release;Manual Lymphatic Drainage (MLD)    Myofascial Release  Cross hand myofascial release along the anterior chest wall from sternum to anterior deltoid, lateral breast into L medial brachium, longitudinally at the proxima. medial brachium with arm in abduction to ~90 degrees, deep myofascial release along the edge of the anterior deltoid, distal medial brachium to the L axilla, anterior L breast to the proximal medial brachium.     Manual Lymphatic Drainage (MLD)  In supine: short neck, swimming in the terminus, bil shoulder collectors, bil axillary and inguinal nodes, anterior inter-axillary anastomosis, L axillo-inguinal anastomosis, superior and inferior breast toward corresponding anastomosis; re-worked surfaces  PT Education - 08/25/19 0943    Education Details  Pt will continue with proprioceptive and scapular stabilization activities at home. She will continue wearing compression over the next couple of weeks but reduce usage on the LUE including not wearing in the afternoon one week then the next week not wearing in morning and afternoon. She will then pick a day on the weekend to not wear compression on the LUE all day. Pt will then only wear compression with repetitive activities or if she is in hot weather for prolonged periods of time.    Person(s) Educated  Patient    Methods  Explanation    Comprehension  Verbalized understanding          PT Long Term Goals - 06/24/19 1357      PT LONG TERM GOAL #1    Title  Pt will be independent in self MLD and use of compression to manage fullness in left upper quadrant    Time  8    Period  Weeks    Status  Achieved      PT LONG TERM GOAL #2   Title  Pt will have 150 degrees of left shoulder flexion and abduction so that she can return to normal activities    Time  8    Period  Weeks    Status  On-going      PT LONG TERM GOAL #3   Title  Pt will report she is able to do her job that involves manual work with both arms with no increase in pain or cording    Time  8    Period  Weeks    Status  On-going      PT LONG TERM GOAL #4   Title  Pt will have decrease in visual and palpable axillary cording by 50%  per photo and verbal report    Time  8    Period  Weeks    Status  On-going            Plan - 08/25/19 0806    Clinical Impression Statement  Pt continues with cording/fascial adhesions in the L anterior/lateral upper quadrant. Cording continues from the anterior deltoid into the brachium that is visible with longitudinal stretch but not with cross hand; pt reports deeper sensation with cross hand stretching. Deep myofascial release performed along the edge of the anterior deltoid to try to free up these cords with little success. Cross hand from the anterior L breast into the proximal brachium demonstrated better results of visible cording from the edge of the anterior deltoid. MLD performed prior to myofascial release in order to facilitate fluid flow. Pt will benefit from continued POC at this itme.    Personal Factors and Comorbidities  Comorbidity 2    Comorbidities  breast surgery with seroma. previous history of increased scarring after other surgeries    Examination-Activity Limitations  Reach Overhead    Stability/Clinical Decision Making  Stable/Uncomplicated    Rehab Potential  Good    PT Frequency  3x / week    PT Duration  8 weeks    PT Treatment/Interventions  ADLs/Self Care Home Management;Therapeutic exercise;Orthotic  Fit/Training;Patient/family education;Manual techniques;Manual lymph drainage;Passive range of motion;Scar mobilization;Taping;Functional mobility training;DME Instruction;Therapeutic activities;Neuromuscular re-education;Dry needling    PT Next Visit Plan  work on posterior glides of the L humeral head and cording in the L forearm , Work on L teres major/minor, serratus and latissimus wing on the L, focus on MLD  of the L breast/arm, myofascial release for cording    PT Home Exercise Plan  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    Consulted and Agree with Plan of Care  Patient       Patient will benefit from skilled therapeutic intervention in order to improve the following deficits and impairments:  Pain, Impaired UE functional use, Impaired flexibility, Increased fascial restricitons, Decreased strength, Decreased knowledge of use of DME, Decreased range of motion, Decreased scar mobility, Increased edema, Impaired perceived functional ability, Decreased skin integrity, Postural dysfunction  Visit Diagnosis: Aftercare following surgery for neoplasm  Stiffness of left shoulder, not elsewhere classified  Muscle weakness (generalized)  Acute pain of left shoulder     Problem List Patient Active Problem List   Diagnosis Date Noted  . Numbness 02/04/2016  . Cognitive changes 02/04/2016  . Other fatigue 02/04/2016    Claudia Desanctis, PT 08/25/2019, 9:58 AM  [facility] [address] [place], [place], [address] Phone: [phone]   Fax:  [phone]  Name: CEARA WRIGHTSON MRN: [id number] Date of Birth: 10/23/1970

## 2019-08-29 ENCOUNTER — Ambulatory Visit: Payer: Federal, State, Local not specified - PPO

## 2019-08-29 ENCOUNTER — Other Ambulatory Visit: Payer: Self-pay

## 2019-08-29 DIAGNOSIS — M6281 Muscle weakness (generalized): Secondary | ICD-10-CM

## 2019-08-29 DIAGNOSIS — M25612 Stiffness of left shoulder, not elsewhere classified: Secondary | ICD-10-CM | POA: Diagnosis not present

## 2019-08-29 DIAGNOSIS — Z483 Aftercare following surgery for neoplasm: Secondary | ICD-10-CM | POA: Diagnosis not present

## 2019-08-29 DIAGNOSIS — M25512 Pain in left shoulder: Secondary | ICD-10-CM

## 2019-08-29 NOTE — Therapy (Signed)
[facility] [address] [place], [place], [address] Phone: [phone]   Fax:  [phone]  Physical Therapy Treatment  Patient Details  Name: Sheena Jarvis MRN: [id number] Date of Birth: [birth year] Referring Provider (PT): Dr.Byerly    Encounter Date: 08/29/2019  PT End of Session - 08/29/19 1510    Visit Number  50    Number of Visits  53    Date for PT Re-Evaluation  09/06/19    PT Start Time  1507    PT Stop Time  1605    PT Time Calculation (min)  58 min    Activity Tolerance  Patient tolerated treatment well    Behavior During Therapy  [facility] for tasks assessed/performed       Past Medical History:  Diagnosis Date  . Pneumonia   . PONV (postoperative nausea and vomiting)   . Vision abnormalities     Past Surgical History:  Procedure Laterality Date  . APPENDECTOMY    . BREAST CYST ASPIRATION Left   . HIP ARTHROSCOPY    . MASS EXCISION Left 02/10/2019   Procedure: EXCISION OF LEFT BREAST MASS;  Surgeon: Almond Lint, MD;  Location: [facility];  Service: General;  Laterality: Left;  . SCAR REVISION Left 06/22/2019   Procedure: LEFT AXILLARY CONTRACTURE RELEASE;  Surgeon: Almond Lint, MD;  Location: [facility];  Service: General;  Laterality: Left;    There were no vitals filed for this visit.  Subjective Assessment - 08/29/19 1510    Subjective  Pt reports that over the weekend she had a lot of pain in the L breast and axilla due to swelling which made it so that she was unable to participate in the activities that she wanted.    Pertinent History  Pt has removed of benign breast mass on 02/10/2019 with complication of seroma and clogged drain and possible infection.  She also developed lymphedema in her left breast and axillary cording.  She receieved extensive PT with MLD, compression, manual therapy and dry needling, but had a significant persistent cord that was painful and  limited shoulder ROM She underwent and incision of that contracted scar tissue on 06/22/2019 that included one node and develped a seroma after that.    Patient Stated Goals  To get her arm back to normal    Currently in Pain?  Yes    Pain Score  5                        [facility] Adult PT Treatment/Exercise - 08/29/19 0001      Moist Heat Therapy   Number Minutes Moist Heat  10 Minutes    Moist Heat Location  Other (comment)    L axilla, L lateral breast, L medial brachium during MLD     Manual Therapy   Manual Therapy  Myofascial release;Manual Lymphatic Drainage (MLD)    Myofascial Release  Cross hand myofascial release along the anterior chest wall from sternum to anterior deltoid, lateral breast into L medial brachium, longitudinally at the proximal/medial brachium with arm in abduction to ~90 degrees,distal medial brachium to the L axilla, anterior L breast to the proximal medial brachium.     Manual Lymphatic Drainage (MLD)  In supine: short neck, swimming in the terminus, bil shoulder collectors, bil axillary and inguinal nodes, anterior inter-axillary anastomosis, L axillo-inguinal anastomosis, superior and inferior breast toward corresponding anastomosis; re-worked surfaces following myofascial release.  PT Education - 08/29/19 1825    Education Details  Pt will continue with her exercises at home. She will continue with compression. Discussed length of time that swelling lasts following surgery including the up/down nature of post-op swelling. [employer] was contacted for pt to get pump adjusted for more time on the L breast.    Person(s) Educated  Patient    Methods  Explanation    Comprehension  Verbalized understanding          PT Long Term Goals - 06/24/19 1357      PT LONG TERM GOAL #1   Title  Pt will be independent in self MLD and use of compression to manage fullness in left upper quadrant    Time  8    Period  Weeks    Status   Achieved      PT LONG TERM GOAL #2   Title  Pt will have 150 degrees of left shoulder flexion and abduction so that she can return to normal activities    Time  8    Period  Weeks    Status  On-going      PT LONG TERM GOAL #3   Title  Pt will report she is able to do her job that involves manual work with both arms with no increase in pain or cording    Time  8    Period  Weeks    Status  On-going      PT LONG TERM GOAL #4   Title  Pt will have decrease in visual and palpable axillary cording by 50%  per photo and verbal report    Time  8    Period  Weeks    Status  On-going            Plan - 08/29/19 1510    Clinical Impression Statement  Pt continues with cording and fascial adhesions in the L atnerior/lateral upper quadrant with decreased stiffness and improved movement from the body this session. The cords from the anterior deltoid to the biceps are more defined this session demonstrated decrease adhesions to the tissues below. Pt continues with small cord that crosses the antecubital fossa; she continues to loosen up following physical therapy myofascial release. Improved edema in the L breast following MLD and then re-worked following myofascial release to facilitate fluid flow. Pt will benefit from continued POC at this time.    Personal Factors and Comorbidities  Comorbidity 2    Comorbidities  breast surgery with seroma. previous history of increased scarring after other surgeries    Examination-Activity Limitations  Reach Overhead    Rehab Potential  Good    PT Frequency  3x / week    PT Duration  8 weeks    PT Treatment/Interventions  ADLs/Self Care Home Management;Therapeutic exercise;Orthotic Fit/Training;Patient/family education;Manual techniques;Manual lymph drainage;Passive range of motion;Scar mobilization;Taping;Functional mobility training;DME Instruction;Therapeutic activities;Neuromuscular re-education;Dry needling    PT Next Visit Plan  work on posterior  glides of the L humeral head and cording in the L forearm , Work on L teres major/minor, serratus and latissimus wing on the L, focus on MLD of the L breast/arm, myofascial release for cording    PT Home Exercise Plan  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    Consulted and Agree with Plan of Care  Patient       Patient will benefit from skilled therapeutic intervention in order to improve  the following deficits and impairments:  Pain, Impaired UE functional use, Impaired flexibility, Increased fascial restricitons, Decreased strength, Decreased knowledge of use of DME, Decreased range of motion, Decreased scar mobility, Increased edema, Impaired perceived functional ability, Decreased skin integrity, Postural dysfunction  Visit Diagnosis: Aftercare following surgery for neoplasm  Stiffness of left shoulder, not elsewhere classified  Muscle weakness (generalized)  Acute pain of left shoulder     Problem List Patient Active Problem List   Diagnosis Date Noted  . Numbness 02/04/2016  . Cognitive changes 02/04/2016  . Other fatigue 02/04/2016    Ander Purpura, PT 08/29/2019, 6:32 PM  [place], [place], [address] Phone: [phone]   Fax:  [phone]  Name: Sheena Jarvis MRN: [id number] Date of Birth: 1970-09-13

## 2019-08-31 ENCOUNTER — Encounter: Payer: Federal, State, Local not specified - PPO | Admitting: Physical Therapy

## 2019-09-01 ENCOUNTER — Other Ambulatory Visit: Payer: Self-pay

## 2019-09-01 ENCOUNTER — Ambulatory Visit: Payer: Federal, State, Local not specified - PPO

## 2019-09-01 DIAGNOSIS — M25612 Stiffness of left shoulder, not elsewhere classified: Secondary | ICD-10-CM

## 2019-09-01 DIAGNOSIS — Z483 Aftercare following surgery for neoplasm: Secondary | ICD-10-CM

## 2019-09-01 DIAGNOSIS — M6281 Muscle weakness (generalized): Secondary | ICD-10-CM

## 2019-09-01 DIAGNOSIS — M25512 Pain in left shoulder: Secondary | ICD-10-CM | POA: Diagnosis not present

## 2019-09-01 NOTE — Therapy (Signed)
[place], [place], [address] Phone: [phone]   Fax:  [phone]  Physical Therapy Treatment  Patient Details  Name: Sheena Jarvis MRN: [id number] Date of Birth: 13-Oct-1970 Referring Provider (PT): Dr.Byerly    Encounter Date: 09/01/2019  PT End of Session - 09/01/19 0800    Visit Number  51    Number of Visits  53    Date for PT Re-Evaluation  09/06/19    PT Start Time  0802    PT Stop Time  0900    PT Time Calculation (min)  58 min    Activity Tolerance  Patient tolerated treatment well    Behavior During Therapy  [facility] for tasks assessed/performed       Past Medical History:  Diagnosis Date  . Pneumonia   . PONV (postoperative nausea and vomiting)   . Vision abnormalities     Past Surgical History:  Procedure Laterality Date  . APPENDECTOMY    . BREAST CYST ASPIRATION Left   . HIP ARTHROSCOPY    . MASS EXCISION Left 02/10/2019   Procedure: EXCISION OF LEFT BREAST MASS;  Surgeon: Stark Klein, MD;  Location: [facility];  Service: General;  Laterality: Left;  . SCAR REVISION Left 06/22/2019   Procedure: LEFT AXILLARY CONTRACTURE RELEASE;  Surgeon: Stark Klein, MD;  Location: [place];  Service: General;  Laterality: Left;    There were no vitals filed for this visit.  Subjective Assessment - 09/01/19 0801    Subjective  Pt states that she continues with pain in her L breast and feels that it continues to swell more than it was 2 weeks ago.    Pertinent History  Pt has removed of benign breast mass on 66/0/6301 with complication of seroma and clogged drain and possible infection.  She also developed lymphedema in her left breast and axillary cording.  She receieved extensive PT with MLD, compression, manual therapy and dry needling, but had a significant persistent cord that was painful and limited shoulder ROM She underwent and incision of that contracted  scar tissue on 06/22/2019 that included one node and develped a seroma after that.    Patient Stated Goals  To get her arm back to normal         [facility] PT Assessment - 09/01/19 0001      AROM   Left Shoulder Flexion  169 Degrees    Left Shoulder ABduction  161 Degrees   pt can compensate to higher with shoulder elevation                  [facility] Adult PT Treatment/Exercise - 09/01/19 0001      Manual Therapy   Manual Therapy  Myofascial release;Manual Lymphatic Drainage (MLD)    Myofascial Release  Cross hand myofascial release along the anterior chest wall from sternum to anterior deltoid, lateral breast into L medial brachium, longitudinally at the proximal/medial brachium with arm in flexion with rolled towel for placement to prevent anterior/lateral progression of scapula, distal/medial brachium to the L axilla, anterior L breast to the proximal medial brachium; extra time spent at the superior/lateral L breast to the axilla over cording.     Manual Lymphatic Drainage (MLD)  In supine: short neck, swimming in the terminus, bil shoulder collectors, bil axillary and inguinal nodes, anterior inter-axillary anastomosis, L axillo-inguinal anastomosis, superior and inferior breast toward corresponding anastomosis; re-worked surfaces following myofascial release and deep abdominals  PT Education - 09/01/19 1005    Education Details  During myofascial release discussed possibly discussing with surgeon about what feels like a seroma in the L breast and that this could be causing increased swelling in that breast. Pt has an [place] unit and may try to image it herself. Discussed possibly trying to find a pool to promote activity for improved mobility while decreasing aggravation and focus at the L breast. Also, discussed possibly wearing less compression if she can tolerate it to see if this doesn't help with her comfort level while at work.    Person(s) Educated  Patient     Methods  Explanation    Comprehension  Verbalized understanding          PT Long Term Goals - 06/24/19 1357      PT LONG TERM GOAL #1   Title  Pt will be independent in self MLD and use of compression to manage fullness in left upper quadrant    Time  8    Period  Weeks    Status  Achieved      PT LONG TERM GOAL #2   Title  Pt will have 150 degrees of left shoulder flexion and abduction so that she can return to normal activities    Time  8    Period  Weeks    Status  On-going      PT LONG TERM GOAL #3   Title  Pt will report she is able to do her job that involves manual work with both arms with no increase in pain or cording    Time  8    Period  Weeks    Status  On-going      PT LONG TERM GOAL #4   Title  Pt will have decrease in visual and palpable axillary cording by 50%  per photo and verbal report    Time  8    Period  Weeks    Status  On-going            Plan - 09/01/19 0800    Clinical Impression Statement  Pt continues with increased edema in her L breast with what feels like a possible seroma continueing. Discussed with patient that if this is a seroma it may be contributing to her continuous discomfort. Cording continues to improve and her ROM continues to return with small increments of increased in ROM every week. Edema continues to improved following MLD to the L breast but does not get down as small as it had been previously. Pt will benefit from continued POC at this time.    Personal Factors and Comorbidities  Comorbidity 2    Comorbidities  breast surgery with seroma. previous history of increased scarring after other surgeries    Examination-Activity Limitations  Reach Overhead    Stability/Clinical Decision Making  Stable/Uncomplicated    Rehab Potential  Good    PT Frequency  3x / week    PT Duration  8 weeks    PT Treatment/Interventions  ADLs/Self Care Home Management;Therapeutic exercise;Orthotic Fit/Training;Patient/family education;Manual  techniques;Manual lymph drainage;Passive range of motion;Scar mobilization;Taping;Functional mobility training;DME Instruction;Therapeutic activities;Neuromuscular re-education;Dry needling    PT Next Visit Plan  did you do [place]? Did you speak to surgeon? 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    Consulted and Agree with Plan of Care  Patient       Patient will benefit from skilled therapeutic intervention in order to improve the following deficits and impairments:  Pain, Impaired UE functional use, Impaired flexibility, Increased fascial restricitons, Decreased strength, Decreased knowledge of use of DME, Decreased range of motion, Decreased scar mobility, Increased edema, Impaired perceived functional ability, Decreased skin integrity, Postural dysfunction  Visit Diagnosis: Aftercare following surgery for neoplasm  Stiffness of left shoulder, not elsewhere classified  Muscle weakness (generalized)  Acute pain of left shoulder     Problem List Patient Active Problem List   Diagnosis Date Noted  . Numbness 02/04/2016  . Cognitive changes 02/04/2016  . Other fatigue 02/04/2016    Claudia Desanctis, PT 09/01/2019, 10:32 AM  [facility] [address] [place], [place], [address] Phone: [phone]   Fax:  [phone]  Name: Sheena Jarvis MRN: [id number] Date of Birth: Nov 23, 1970

## 2019-09-05 ENCOUNTER — Ambulatory Visit: Payer: Federal, State, Local not specified - PPO | Attending: General Surgery

## 2019-09-05 ENCOUNTER — Other Ambulatory Visit: Payer: Self-pay

## 2019-09-05 DIAGNOSIS — M25512 Pain in left shoulder: Secondary | ICD-10-CM | POA: Diagnosis not present

## 2019-09-05 DIAGNOSIS — Z483 Aftercare following surgery for neoplasm: Secondary | ICD-10-CM | POA: Diagnosis not present

## 2019-09-05 DIAGNOSIS — M25612 Stiffness of left shoulder, not elsewhere classified: Secondary | ICD-10-CM | POA: Diagnosis not present

## 2019-09-05 DIAGNOSIS — M6281 Muscle weakness (generalized): Secondary | ICD-10-CM | POA: Insufficient documentation

## 2019-09-05 NOTE — Therapy (Signed)
[facility] [address] [place], [place], [address] Phone: [phone]   Fax:  [phone]  Physical Therapy Treatment  Patient Details  Jarvis: Sheena Jarvis MRN: [id number] Date of Birth: 04/15/1971 Referring Provider (PT): Dr.Byerly    Encounter Date: 09/05/2019  PT End of Session - 09/05/19 1617    Visit Number  52    Number of Visits  53    Date for PT Re-Evaluation  09/06/19    PT Start Time  1610    PT Stop Time  1705    PT Time Calculation (min)  55 min    Activity Tolerance  Patient tolerated treatment well    Behavior During Therapy  [facility] for tasks assessed/performed       Past Medical History:  Diagnosis Date  . Pneumonia   . PONV (postoperative nausea and vomiting)   . Vision abnormalities     Past Surgical History:  Procedure Laterality Date  . APPENDECTOMY    . BREAST CYST ASPIRATION Left   . HIP ARTHROSCOPY    . MASS EXCISION Left 02/10/2019   Procedure: EXCISION OF LEFT BREAST MASS;  Surgeon: Almond Lint, MD;  Location: [facility];  Service: General;  Laterality: Left;  . SCAR REVISION Left 06/22/2019   Procedure: LEFT AXILLARY CONTRACTURE RELEASE;  Surgeon: Almond Lint, MD;  Location: [facility];  Service: General;  Laterality: Left;    There were no vitals filed for this visit.  Subjective Assessment - 09/05/19 1618    Subjective  Pt states that she went to the MD this morning and they are going to order an [place] to see why her L breast is starting to swell. She states that she has been sore and sits around a 4/5 in pain in her L breast due to feeling distended.    Pertinent History  Pt has removed of benign breast mass on 02/10/2019 with complication of seroma and clogged drain and possible infection.  She also developed lymphedema in her left breast and axillary cording.  She receieved extensive PT with MLD, compression, manual therapy and dry needling, but had a  significant persistent cord that was painful and limited shoulder ROM She underwent and incision of that contracted scar tissue on 06/22/2019 that included one node and develped a seroma after that.    Patient Stated Goals  To get her arm back to normal    Currently in Pain?  Yes    Pain Score  5     Pain Location  Breast    Pain Orientation  Left    Pain Descriptors / Indicators  Aching    Pain Type  Surgical pain    Pain Onset  More than a month ago    Pain Frequency  Constant    Aggravating Factors   at rest    Pain Relieving Factors  compression and MLD    Effect of Pain on Daily Activities  limts work activities.                       [facility] Adult PT Treatment/Exercise - 09/05/19 0001      Manual Therapy   Manual Therapy  Myofascial release;Manual Lymphatic Drainage (MLD);Soft tissue mobilization    Soft tissue mobilization  STM to the wing of the L latissimus dorsi, subscapularis as it emerges from underneath the scapula; improved ROM of the L shoulder with blocking the scapula following STM.  Myofascial Release  Cross hand myofascial release into the L axilla into the L medial brachium, then posteriorly from the L posterior shoulder into the medial scapula from the L axilla down the L lateral trunk.     Manual Lymphatic Drainage (MLD)  In supine: short neck, swimming in the terminus, bil shoulder collectors, bil axillary and inguinal nodes, anterior inter-axillary anastomosis, L axillo-inguinal anastomosis, superior and inferior breast toward corresponding anastomosis; re-worked surfaces following myofascial release and STM             PT Education - 09/05/19 1724    Education Details  Discussed continuing with myofascial/skeletal muscle release at home in the latissimus/subscapularis on the L to improve L shoulder ROM. Discussed the possibility of skeletal muscle restriction vs. cording/scar tissue restrictions.    Person(s) Educated  Patient    Methods   Explanation    Comprehension  Verbalized understanding          PT Long Term Goals - 06/24/19 1357      PT LONG TERM GOAL #1   Title  Pt will be independent in self MLD and use of compression to manage fullness in left upper quadrant    Time  8    Period  Weeks    Status  Achieved      PT LONG TERM GOAL #2   Title  Pt will have 150 degrees of left shoulder flexion and abduction so that she can return to normal activities    Time  8    Period  Weeks    Status  On-going      PT LONG TERM GOAL #3   Title  Pt will report she is able to do her job that involves manual work with both arms with no increase in pain or cording    Time  8    Period  Weeks    Status  On-going      PT LONG TERM GOAL #4   Title  Pt will have decrease in visual and palpable axillary cording by 50%  per photo and verbal report    Time  8    Period  Weeks    Status  On-going            Plan - 09/05/19 1616    Clinical Impression Statement  Pt continues with edema in her L breast that feels slightly better after MLD but is not reducing significantly. She went to see the MD today and is going to get an [place] on the L breast. Palpable tightness/tenderness was noted at the L latissimus wing and subscapularis this session during myofascial release and P/ROM. Pt continues with impingment noted at the L shoulder with shoulder RO and scapular blocking; improved following STM/TpR. MLD was performed for the L breast prior to and re-worked following myofascial release and STM. Pt will benefit from continued POC at this time.    Personal Factors and Comorbidities  Comorbidity 2    Comorbidities  breast surgery with seroma. previous history of increased scarring after other surgeries    Examination-Activity Limitations  Reach Overhead    Rehab Potential  Good    PT Frequency  3x / week    PT Duration  8 weeks    PT Treatment/Interventions  ADLs/Self Care Home Management;Therapeutic exercise;Orthotic  Fit/Training;Patient/family education;Manual techniques;Manual lymph drainage;Passive range of motion;Scar mobilization;Taping;Functional mobility training;DME Instruction;Therapeutic activities;Neuromuscular re-education;Dry needling    PT Next Visit Plan  did you do [place]? Did you speak to  surgeon? 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    Consulted and Agree with Plan of Care  Patient       Patient will benefit from skilled therapeutic intervention in order to improve the following deficits and impairments:  Pain, Impaired UE functional use, Impaired flexibility, Increased fascial restricitons, Decreased strength, Decreased knowledge of use of DME, Decreased range of motion, Decreased scar mobility, Increased edema, Impaired perceived functional ability, Decreased skin integrity, Postural dysfunction  Visit Diagnosis: Aftercare following surgery for neoplasm  Stiffness of left shoulder, not elsewhere classified  Muscle weakness (generalized)  Acute pain of left shoulder     Problem List Patient Active Problem List   Diagnosis Date Noted  . Numbness 02/04/2016  . Cognitive changes 02/04/2016  . Other fatigue 02/04/2016    Ander Purpura, PT 09/05/2019, 5:28 PM  [place], [place], [address] Phone: [phone]   Fax:  [phone]  Jarvis: Sheena Jarvis MRN: [id number] Date of Birth: 10/15/1970

## 2019-09-07 ENCOUNTER — Other Ambulatory Visit: Payer: Self-pay

## 2019-09-07 ENCOUNTER — Ambulatory Visit: Payer: Federal, State, Local not specified - PPO

## 2019-09-07 DIAGNOSIS — Z483 Aftercare following surgery for neoplasm: Secondary | ICD-10-CM

## 2019-09-07 DIAGNOSIS — M6281 Muscle weakness (generalized): Secondary | ICD-10-CM | POA: Diagnosis not present

## 2019-09-07 DIAGNOSIS — M25612 Stiffness of left shoulder, not elsewhere classified: Secondary | ICD-10-CM

## 2019-09-07 DIAGNOSIS — M25512 Pain in left shoulder: Secondary | ICD-10-CM | POA: Diagnosis not present

## 2019-09-07 NOTE — Therapy (Signed)
[facility] [address] [place], [place], [address] Phone: [phone]   Fax:  [phone]  Physical Therapy Progress Note  Progress Note Reporting Period 06/24/2019 to 09/07/2019  See note below for Objective Data and Assessment of Progress/Goals.       Patient Details  Name: Sheena Jarvis MRN: [id number] Date of Birth: [birth year] Referring Provider (PT): Dr.Byerly    Encounter Date: 09/07/2019  PT End of Session - 09/07/19 1613    Visit Number  53    Number of Visits  65    Date for PT Re-Evaluation  10/26/19    PT Start Time  1610    PT Stop Time  1718    PT Time Calculation (min)  68 min    Activity Tolerance  Patient tolerated treatment well    Behavior During Therapy  [facility] for tasks assessed/performed       Past Medical History:  Diagnosis Date  . Pneumonia   . PONV (postoperative nausea and vomiting)   . Vision abnormalities     Past Surgical History:  Procedure Laterality Date  . APPENDECTOMY    . BREAST CYST ASPIRATION Left   . HIP ARTHROSCOPY    . MASS EXCISION Left 02/10/2019   Procedure: EXCISION OF LEFT BREAST MASS;  Surgeon: Almond Lint, MD;  Location: [facility];  Service: General;  Laterality: Left;  . SCAR REVISION Left 06/22/2019   Procedure: LEFT AXILLARY CONTRACTURE RELEASE;  Surgeon: Almond Lint, MD;  Location: [facility];  Service: General;  Laterality: Left;    There were no vitals filed for this visit.  Subjective Assessment - 09/07/19 1613    Subjective  Pt states that she got dry needling done yesterday in the subscapularis, latissimus dorsi and along the cord. She states that she was able to get an increased ROM to 171 degrees in abduction    Pertinent History  Pt has removed of benign breast mass on 02/10/2019 with complication of seroma and clogged drain and possible infection.  She also developed lymphedema in her left breast and axillary cording.   She receieved extensive PT with MLD, compression, manual therapy and dry needling, but had a significant persistent cord that was painful and limited shoulder ROM She underwent and incision of that contracted scar tissue on 06/22/2019 that included one node and develped a seroma after that.    Patient Stated Goals  To get her arm back to normal    Currently in Pain?  Yes    Pain Score  5     Pain Location  Breast    Pain Orientation  Left    Pain Descriptors / Indicators  Aching    Pain Onset  More than a month ago    Pain Frequency  Constant    Aggravating Factors   with bumping, touching or not wearing compression.    Pain Relieving Factors  compression and MLD    Effect of Pain on Daily Activities  limits work activities and is painful but pt is able to work through the pain.         [facility] PT Assessment - 09/07/19 0001      AROM   Left Shoulder ABduction  173 Degrees   without blocked scapula with blocked scapula pt is 163                  [facility] Adult PT Treatment/Exercise - 09/07/19 0001      Manual  Therapy   Manual Therapy  Myofascial release;Manual Lymphatic Drainage (MLD)    Myofascial Release  Cross hand myofascial release continued from the L axilla into the distal medial LUE to the anterior chest toward the sternum, longitudinal myofascial release by the anterior deltoid pt continues with pronounced cords in this area and down the L lateral trunk due to noted cording posteriorly possibly restricting movement of the humerus away from the scapula interfering with scapulohumeral rhythm.     Manual Lymphatic Drainage (MLD)  In supine: short neck, swimming in the terminus, bil shoulder collectors, bil axillary and inguinal nodes, anterior inter-axillary anastomosis, L axillo-inguinal anastomosis, superior and inferior breast toward corresponding anastomosis, L lateral brachium, medial to lateral L brachium, lateral L brachium, re-worked anastomosis, all surfaces of  antebrachium; re-worked surfaces MLD performed followng myofascial release.              PT Education - 09/07/19 1735    Education Details  Pt will continue stretching and stabilization exercises at home for the scapula. She is going to try to get her physical therapist friend to perform 3 more dry needling sessions on her for mobility.    Person(s) Educated  Patient    Methods  Explanation    Comprehension  Verbalized understanding          PT Long Term Goals - 09/07/19 1618      PT LONG TERM GOAL #2   Title  Pt will have 175 degrees of left shoulder flexion and abduction so that she can return to normal activities and return to pt baseline.    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    Time  6    Period  Weeks    Status  On-going    Target Date  10/19/19      PT LONG TERM GOAL #4   Title  Pt will have decrease in visual and palpable axillary cording by 50%  per photo and verbal report    Baseline  Pt continues with cording and has started swelling again in the L breast her MD has been notified and [place] is to be scheduled.    Time  6    Period  Weeks    Status  On-going    Target Date  10/26/19            Plan - 09/07/19 1613    Clinical Impression Statement  Assessed patient goals today and she continues to improve and move toward her goals. Cording stands unyielding but continues to provide relief with myofascial release. She previously was improving significantly with edema in the L breast but recently is experiencing more difficulty with heaviness despite MLD, vasopneumatic pump and compression; she has seen MD and an [place] is to be scheduled. Pt continues  with cording noted in the L axilla into the medial/lateral L breast that is visible down into the L antebrachium wrapping around to posterior and posteriorly in the L axilla. Pt has improvement with cords following myofascial release demonstrating improved clearance from  underlying fascia but continues to tack back down between session. Her ROM has improved significantly but continues to be limited especially when blocking the scapula to prevent winging due to tightness in the surrounding muscualture and possibly cording noted at today's session. Pt will benefit from continued physical therapy 2x/week for 6 weeks if needed to address the above limitations and will reduce frequency as appropriate to 1x/week depending on patient progress.    Personal Factors and Comorbidities  Comorbidity 2    Comorbidities  breast surgery with seroma. previous history of increased scarring after other surgeries    Examination-Activity Limitations  Reach Overhead    Rehab Potential  Good    PT Frequency  2x / week    PT Duration  6 weeks    PT Treatment/Interventions  ADLs/Self Care Home Management;Therapeutic exercise;Orthotic Fit/Training;Patient/family education;Manual techniques;Manual lymph drainage;Passive range of motion;Scar mobilization;Taping;Functional mobility training;DME Instruction;Therapeutic activities;Neuromuscular re-education;Dry needling    PT Next Visit Plan  did you do [place]? Did you speak to surgeon? 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    Consulted and Agree with Plan of Care  Patient       Patient will benefit from skilled therapeutic intervention in order to improve the following deficits and impairments:  Pain, Impaired UE functional use, Impaired  flexibility, Increased fascial restricitons, Decreased strength, Decreased knowledge of use of DME, Decreased range of motion, Decreased scar mobility, Increased edema, Impaired perceived functional ability, Decreased skin integrity, Postural dysfunction  Visit Diagnosis: Aftercare following surgery for neoplasm  Stiffness of left shoulder, not elsewhere classified  Muscle weakness (generalized)  Acute pain of left shoulder     Problem List Patient Active Problem List   Diagnosis Date Noted  . Numbness 02/04/2016  . Cognitive changes 02/04/2016  . Other fatigue 02/04/2016    Claudia Desanctis, PT 09/07/2019, 5:41 PM  [facility] [address] [place], [place], [address] Phone: [phone]   Fax:  [phone]  Name: Sheena Jarvis MRN: [id number] Date of Birth: 10/08/1970

## 2019-09-08 ENCOUNTER — Other Ambulatory Visit: Payer: Self-pay | Admitting: General Surgery

## 2019-09-08 DIAGNOSIS — N6489 Other specified disorders of breast: Secondary | ICD-10-CM

## 2019-09-12 ENCOUNTER — Other Ambulatory Visit: Payer: Self-pay

## 2019-09-12 ENCOUNTER — Ambulatory Visit: Payer: Federal, State, Local not specified - PPO

## 2019-09-12 DIAGNOSIS — M25612 Stiffness of left shoulder, not elsewhere classified: Secondary | ICD-10-CM | POA: Diagnosis not present

## 2019-09-12 DIAGNOSIS — Z483 Aftercare following surgery for neoplasm: Secondary | ICD-10-CM | POA: Diagnosis not present

## 2019-09-12 DIAGNOSIS — M25512 Pain in left shoulder: Secondary | ICD-10-CM

## 2019-09-12 DIAGNOSIS — M6281 Muscle weakness (generalized): Secondary | ICD-10-CM

## 2019-09-12 NOTE — Therapy (Signed)
[facility] [address] [place], [place], [address] Phone: [phone]   Fax:  [phone]  Physical Therapy Treatment  Patient Details  Name: Sheena Jarvis MRN: [id number] Date of Birth: Aug 02, 1970 Referring Provider (PT): Dr.Byerly    Encounter Date: 09/12/2019  PT End of Session - 09/12/19 1724    Visit Number  54    Number of Visits  65    Date for PT Re-Evaluation  10/26/19    PT Start Time  1607    PT Stop Time  1701    PT Time Calculation (min)  54 min    Activity Tolerance  Patient tolerated treatment well    Behavior During Therapy  [facility] for tasks assessed/performed       Past Medical History:  Diagnosis Date  . Pneumonia   . PONV (postoperative nausea and vomiting)   . Vision abnormalities     Past Surgical History:  Procedure Laterality Date  . APPENDECTOMY    . BREAST CYST ASPIRATION Left   . HIP ARTHROSCOPY    . MASS EXCISION Left 02/10/2019   Procedure: EXCISION OF LEFT BREAST MASS;  Surgeon: Almond Lint, MD;  Location: [facility];  Service: General;  Laterality: Left;  . SCAR REVISION Left 06/22/2019   Procedure: LEFT AXILLARY CONTRACTURE RELEASE;  Surgeon: Almond Lint, MD;  Location: [facility];  Service: General;  Laterality: Left;    There were no vitals filed for this visit.  Subjective Assessment - 09/12/19 1657    Subjective  Pt is going in for another dry needling session this week. She states that the swelling is down in her breast today and feels much better without activity.When she performs activity she feels the aching and pulling. She states that her [place] is next week not this week.    Pertinent History  Pt has removed of benign breast mass on 02/10/2019 with complication of seroma and clogged drain and possible infection.  She also developed lymphedema in her left breast and axillary cording.  She receieved extensive PT with MLD, compression, manual therapy  and dry needling, but had a significant persistent cord that was painful and limited shoulder ROM She underwent and incision of that contracted scar tissue on 06/22/2019 that included one node and develped a seroma after that.    Patient Stated Goals  To get her arm back to normal    Currently in Pain?  No/denies    Pain Score  0-No pain         [facility] PT Assessment - 09/12/19 0001      AROM   Left Shoulder Flexion  173 Degrees    Left Shoulder ABduction  160 Degrees                   [facility] Adult PT Treatment/Exercise - 09/12/19 0001      Shoulder Exercises: Supine   Other Supine Exercises  horizontal abduction, flexion, abduction and D2 flexion pattern with soft tissue mobilization along cording and tight musculature.       Manual Therapy   Manual Therapy  Manual Lymphatic Drainage (MLD);Soft tissue mobilization;Passive ROM    Soft tissue mobilization  Soft tissue mobilization during A/ROM and P/ROM into horizontal abduction, D2 flexion pattern, abduction and flexion along the ridge of the anterior deltoid, lattisimus dorsi and serratus anterior, anterior chest all, along the L lateral breast wall and into the L lateral breast towrard areola.     Manual  Lymphatic Drainage (MLD)  In supine: short neck, swimming in the terminus, bil shoulder collectors, bil axillary and inguinal nodes, anterior inter-axillary anastomosis, L axillo-inguinal anastomosis, superior and inferior breast toward corresponding anastomosis, L lateral brachium, medial to lateral L brachium, lateral L brachium, re-worked anastomosis, all surfaces of antebrachium; re-worked surfaces MLD performed followng myofascial release and deep abdominals    Passive ROM  P/ROM into horizontal abduction, flexion and abduction with STM; D2 flexion was only A/ROM 10x each              PT Education - 09/12/19 1724    Education Details  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    Person(s) Educated  Patient    Methods  Explanation    Comprehension  Verbalized understanding          PT Long Term Goals - 09/07/19 1618      PT LONG TERM GOAL #2   Title  Pt will have 175 degrees of left shoulder flexion and abduction so that she can return to normal activities and return to pt baseline.    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    Time  6    Period  Weeks    Status  On-going    Target Date  10/19/19      PT LONG TERM GOAL #4   Title  Pt will have decrease in visual and palpable axillary cording by 50%  per photo and verbal report    Baseline  Pt continues with cording and has started swelling again in the L breast her MD has been notified and [place] is to be scheduled.    Time  6    Period  Weeks    Status  On-going    Target Date  10/26/19            Plan - 09/12/19 1725    Clinical Impression Statement  Pt continues with cordin along the lateral L breast and along the anterior L deltoid. Soft tissue mobilization with A/ROM and P/ROM performs this session along cording and tight musculature to help decrease adhesions, improve blood flow within functional movement patterns. Pt A/ROM improved this session in flexion to 173 degrees and improved in L shoulder abduction w/o significant scapular winging to 160 degrees. Improve stabilization at the L scapula this session though it has an early initiation with abduction. Pt will benefit from continued POC at this time.    Personal Factors and Comorbidities  Comorbidity 2     Comorbidities  breast surgery with seroma. previous history of increased scarring after other surgeries    Examination-Activity Limitations  Reach Overhead    Rehab Potential  Good    PT Frequency  2x / week    PT Duration  6 weeks    PT Treatment/Interventions  ADLs/Self Care Home Management;Therapeutic exercise;Orthotic Fit/Training;Patient/family education;Manual techniques;Manual lymph drainage;Passive range  of motion;Scar mobilization;Taping;Functional mobility training;DME Instruction;Therapeutic activities;Neuromuscular re-education;Dry needling    PT Next Visit Plan  did you do [place]?  how was treatment last session? 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       Patient will benefit from skilled therapeutic intervention in order to improve the following deficits and impairments:  Pain, Impaired UE functional use, Impaired flexibility, Increased fascial restricitons, Decreased strength, Decreased knowledge of use of DME, Decreased range of motion, Decreased scar mobility, Increased edema, Impaired perceived functional ability, Decreased skin integrity, Postural dysfunction  Visit Diagnosis: Aftercare following surgery for neoplasm  Stiffness of left shoulder, not elsewhere classified  Muscle weakness (generalized)  Acute pain of left shoulder     Problem List Patient Active Problem List   Diagnosis Date Noted  . Numbness 02/04/2016  . Cognitive changes 02/04/2016  . Other fatigue 02/04/2016    Claudia Desanctis, PT 09/12/2019, 5:29 PM  [facility] [address] [place], [place], [address] Phone: [phone]   Fax:  [phone]  Name: ALIANNAH HOLSTROM MRN: [id number] Date of Birth: 1970-09-06

## 2019-09-14 ENCOUNTER — Ambulatory Visit: Payer: Federal, State, Local not specified - PPO

## 2019-09-14 ENCOUNTER — Other Ambulatory Visit: Payer: Self-pay

## 2019-09-14 DIAGNOSIS — M25512 Pain in left shoulder: Secondary | ICD-10-CM

## 2019-09-14 DIAGNOSIS — Z483 Aftercare following surgery for neoplasm: Secondary | ICD-10-CM

## 2019-09-14 DIAGNOSIS — M25612 Stiffness of left shoulder, not elsewhere classified: Secondary | ICD-10-CM | POA: Diagnosis not present

## 2019-09-14 DIAGNOSIS — M6281 Muscle weakness (generalized): Secondary | ICD-10-CM

## 2019-09-14 NOTE — Therapy (Addendum)
[facility] [address] [place], [place], [address] Phone: [phone]   Fax:  [phone]  Physical Therapy Treatment  Patient Details  Name: Sheena Jarvis MRN: [id number] Date of Birth: Nov 08, 1970 Referring Provider (PT): Dr.Byerly    Encounter Date: 09/14/2019  PT End of Session - 09/14/19 1605    Visit Number  55    Number of Visits  65    Date for PT Re-Evaluation  10/26/19    PT Start Time  1604    PT Stop Time  1705    PT Time Calculation (min)  61 min    Activity Tolerance  Patient tolerated treatment well    Behavior During Therapy  [facility] for tasks assessed/performed       Past Medical History:  Diagnosis Date  . Pneumonia   . PONV (postoperative nausea and vomiting)   . Vision abnormalities     Past Surgical History:  Procedure Laterality Date  . APPENDECTOMY    . BREAST CYST ASPIRATION Left   . HIP ARTHROSCOPY    . MASS EXCISION Left 02/10/2019   Procedure: EXCISION OF LEFT BREAST MASS;  Surgeon: Almond Lint, MD;  Location: [facility];  Service: General;  Laterality: Left;  . SCAR REVISION Left 06/22/2019   Procedure: LEFT AXILLARY CONTRACTURE RELEASE;  Surgeon: Almond Lint, MD;  Location: [facility];  Service: General;  Laterality: Left;    There were no vitals filed for this visit.  Subjective Assessment - 09/14/19 1605    Subjective  Pt states that her swelling has continued to feel better. Her pain is still less in her L breast.    Pertinent History  Pt has removed of benign breast mass on 02/10/2019 with complication of seroma and clogged drain and possible infection.  She also developed lymphedema in her left breast and axillary cording.  She receieved extensive PT with MLD, compression, manual therapy and dry needling, but had a significant persistent cord that was painful and limited shoulder ROM She underwent and incision of that contracted scar tissue on 06/22/2019  that included one node and develped a seroma after that.    Patient Stated Goals  To get her arm back to normal    Currently in Pain?  No/denies    Pain Score  0-No pain            LYMPHEDEMA/ONCOLOGY QUESTIONNAIRE - 09/14/19 1607      Left Upper Extremity Lymphedema   15 cm Proximal to Olecranon Process  22.2 cm    10 cm Proximal to Olecranon Process  21.3 cm    Olecranon Process  19.9 cm    15 cm Proximal to Ulnar Styloid Process  19.8 cm    Just Proximal to Ulnar Styloid Process  14 cm    Across Hand at [facility]  16.8 cm    At [place] of 2nd Digit  5.7 cm                 [facility] Adult PT Treatment/Exercise - 09/14/19 0001      Manual Therapy   Manual Therapy  Manual Lymphatic Drainage (MLD);Soft tissue mobilization;Passive ROM    Soft tissue mobilization  Soft tissue mobilization during P/ROM into horizontal abduction, abduction, scapation with horizontal abduction and flexion along the ridge of the anterior deltoid, lattisimus dorsi and serratus anterior, anterior chest all, along the L lateral breast wall and into the L lateral breast towrard areola.  Manual Lymphatic Drainage (MLD)  In supine: short neck, swimming in the terminus, bil shoulder collectors, bil axillary and inguinal nodes, anterior inter-axillary anastomosis, L axillo-inguinal anastomosis, superior and inferior breast toward corresponding anastomosis, L lateral brachium, medial to lateral L brachium, lateral L brachium, re-worked anastomosis, all surfaces of antebrachium then re-worked all surfaces; MLD was performed after STM.     Passive ROM  P/ROM into horizontal abduction, flexion, scaption w/horizontal abduction and abduction with STM             PT Education - 09/14/19 1711    Education Details  Pt will continue with scapular stabilization exercises at home she has been using a [place] as well as tapering off compression sleeve.    Person(s) Educated  Patient    Methods   Explanation    Comprehension  Verbalized understanding          PT Long Term Goals - 09/07/19 1618      PT LONG TERM GOAL #2   Title  Pt will have 175 degrees of left shoulder flexion and abduction so that she can return to normal activities and return to pt baseline.    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    Time  6    Period  Weeks    Status  On-going    Target Date  10/19/19      PT LONG TERM GOAL #4   Title  Pt will have decrease in visual and palpable axillary cording by 50%  per photo and verbal report    Baseline  Pt continues with cording and has started swelling again in the L breast her MD has been notified and [place] is to be scheduled.    Time  6    Period  Weeks    Status  On-going    Target Date  10/26/19            Plan - 09/14/19 1604    Clinical Impression Statement  Pt circumferential measurements were taken this session with decreased measurements as she weans out of the compression sleeve. Pt continues with cording along the lateral L breast and along the anterior L deltoid; decreased since last session in the lateral breast and has greater definition into the medial brachium from across the anterior deltoid demonstrating decreased adhesions to underlying fascia. perform Soft tissue mobilization with passive ROM this session into scaption horizontal abduction, flexion and abduction along cording and tight tissue. MLD was performed following STM this session. Pt will benefit from continued POC at this time.    Personal Factors and Comorbidities  Comorbidity 2    Comorbidities  breast surgery with seroma. previous history of increased scarring after  other surgeries    Examination-Activity Limitations  Reach Overhead    Rehab Potential  Good    PT Frequency  2x / week    PT Duration  6 weeks    PT Treatment/Interventions  ADLs/Self Care Home Management;Therapeutic exercise;Orthotic Fit/Training;Patient/family education;Manual techniques;Manual lymph drainage;Passive range of motion;Scar mobilization;Taping;Functional mobility training;DME  Instruction;Therapeutic activities;Neuromuscular re-education;Dry needling    PT Next Visit Plan  did you do [place]?  how was treatment last session? 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    Consulted and Agree with Plan of Care  Patient       Patient will benefit from skilled therapeutic intervention in order to improve the following deficits and impairments:  Pain, Impaired UE functional use, Impaired flexibility, Increased fascial restricitons, Decreased strength, Decreased knowledge of use of DME, Decreased range of motion, Decreased scar mobility, Increased edema, Impaired perceived functional ability, Decreased skin integrity, Postural dysfunction  Visit Diagnosis: Aftercare following surgery for neoplasm  Stiffness of left shoulder, not elsewhere classified  Muscle weakness (generalized)  Acute pain of left shoulder     Problem List Patient Active Problem List   Diagnosis Date Noted  . Numbness 02/04/2016  . Cognitive changes 02/04/2016  . Other fatigue 02/04/2016    Ander Purpura, PT 09/14/2019, 5:20 PM  [place], [place], [address] Phone: [phone]   Fax:  [phone]  Name: Sheena Jarvis MRN: [id number] Date of Birth: 12-17-1970

## 2019-09-19 ENCOUNTER — Other Ambulatory Visit: Payer: Self-pay | Admitting: General Surgery

## 2019-09-19 ENCOUNTER — Other Ambulatory Visit: Payer: Self-pay

## 2019-09-19 ENCOUNTER — Ambulatory Visit
Admission: RE | Admit: 2019-09-19 | Discharge: 2019-09-19 | Disposition: A | Discharge status: Home / Self Care | Payer: Federal, State, Local not specified - PPO | Source: Ambulatory Visit | Attending: General Surgery | Admitting: General Surgery

## 2019-09-19 DIAGNOSIS — N6489 Other specified disorders of breast: Secondary | ICD-10-CM | POA: Diagnosis not present

## 2019-09-19 DIAGNOSIS — R922 Inconclusive mammogram: Secondary | ICD-10-CM | POA: Diagnosis not present

## 2019-09-21 ENCOUNTER — Ambulatory Visit: Payer: Federal, State, Local not specified - PPO

## 2019-09-21 ENCOUNTER — Other Ambulatory Visit: Payer: Self-pay

## 2019-09-21 DIAGNOSIS — Z483 Aftercare following surgery for neoplasm: Secondary | ICD-10-CM

## 2019-09-21 DIAGNOSIS — M25512 Pain in left shoulder: Secondary | ICD-10-CM

## 2019-09-21 DIAGNOSIS — M25612 Stiffness of left shoulder, not elsewhere classified: Secondary | ICD-10-CM | POA: Diagnosis not present

## 2019-09-21 DIAGNOSIS — M6281 Muscle weakness (generalized): Secondary | ICD-10-CM

## 2019-09-21 NOTE — Therapy (Addendum)
[place], [place], [address] Phone: [phone]   Fax:  [phone]  Physical Therapy Treatment  Patient Details  Name: Sheena Jarvis MRN: [id number] Date of Birth: Aug 21, 1970 Referring Provider (PT): Dr.Byerly    Encounter Date: 09/21/2019  PT End of Session - 09/21/19 1619    Visit Number  56    Number of Visits  65    Date for PT Re-Evaluation  10/26/19    PT Start Time  1500    PT Stop Time  1600    PT Time Calculation (min)  60 min    Activity Tolerance  Patient tolerated treatment well    Behavior During Therapy  [facility] for tasks assessed/performed       Past Medical History:  Diagnosis Date  . Pneumonia   . PONV (postoperative nausea and vomiting)   . Vision abnormalities     Past Surgical History:  Procedure Laterality Date  . APPENDECTOMY    . BREAST CYST ASPIRATION Left   . HIP ARTHROSCOPY    . MASS EXCISION Left 02/10/2019   Procedure: EXCISION OF LEFT BREAST MASS;  Surgeon: Stark Klein, MD;  Location: [place];  Service: General;  Laterality: Left;  . SCAR REVISION Left 06/22/2019   Procedure: LEFT AXILLARY CONTRACTURE RELEASE;  Surgeon: Stark Klein, MD;  Location: [place];  Service: General;  Laterality: Left;    There were no vitals filed for this visit.  Subjective Assessment - 09/21/19 1520    Subjective  Pt states that she continues to feel better and that she believes that her range will improve with time. She has been wearing less compression without an increase in symptoms.    Pertinent History  Pt has removed of benign breast mass on 68/07/4194 with complication of seroma and clogged drain and possible infection.  She also developed lymphedema in her left breast and axillary cording.  She receieved extensive PT with MLD, compression, manual therapy and dry needling, but had a significant persistent cord that was painful and limited  shoulder ROM She underwent and incision of that contracted scar tissue on 06/22/2019 that included one node and develped a seroma after that.    Patient Stated Goals  To get her arm back to normal    Currently in Pain?  No/denies    Pain Score  0-No pain                        [facility] Adult PT Treatment/Exercise - 09/21/19 0001      Manual Therapy   Manual Therapy  Myofascial release;Manual Lymphatic Drainage (MLD)    Myofascial Release  easy longitudinal myofascial release along the L axilla into the cord from the lateral L areola into the medial L brachium     Manual Lymphatic Drainage (MLD)  In supine: short neck, swimming in the terminus, bil shoulder collectors, bil axillary and inguinal nodes, anterior inter-axillary anastomosis, L axillo-inguinal anastomosis, superior and inferior breast toward corresponding anastomosis, L lateral brachium, medial to lateral L brachium, lateral L brachium, re-worked anastomosis, all surfaces of antebrachium then re-worked all surfaces; MLD was performed after myofascial release             PT Education - 09/21/19 1615    Education Details  Pt will continue with scapular stabilization exercises at home. She was educated on reasons to wear her compression and risk reduction precautions including avoiding extreme weather  and prolonged hot showers/hot tubs for more than 15 minutes. As well as discussed the role of inflammation with lymphedema and spontaneously reversible lymphedema.    Person(s) Educated  Patient    Methods  Explanation    Comprehension  Verbalized understanding          PT Long Term Goals - 09/07/19 1618      PT LONG TERM GOAL #2   Title  Pt will have 175 degrees of left shoulder flexion and abduction so that she can return to normal activities and return to pt baseline.    Baseline  updated goal    Time  6    Period  Weeks    Status  Revised    Target Date  11/02/19      PT LONG TERM GOAL #3   Title  Pt  will report she is able to do her job that involves manual work with both arms with no increase in pain or cording    Baseline  Pt continues with difficulty due to increased stiffness and limited with corded tissue.    Time  6    Period  Weeks    Status  On-going    Target Date  10/19/19      PT LONG TERM GOAL #4   Title  Pt will have decrease in visual and palpable axillary cording by 50%  per photo and verbal report    Baseline  Pt continues with cording and has started swelling again in the L breast her MD has been notified and [place] is to be scheduled.    Time  6    Period  Weeks    Status  On-going    Target Date  10/26/19            Plan - 09/21/19 1709    Clinical Impression Statement  Pt presents to physical therapy with continued improvement in edema in the LUE and breast. She is reporting decreased pain in her L brachium and L breast as she has been weaning out of compression. Discussed spontaneously reversible lymphedema with patient and that she will need to be aware of the signs/symptoms of early lymphedema to manage lymphedema if she does notice any of these signs in order to reverse swelling with the utilization of manual lymph drainage, compression and exercise. She continues with cording but seems to be limited more by periscapular musculature at this time as demonstrated by significant movement of the scapula with movement into abduction that is not present with movement into flexion. Easy myofascial release was performed over cording this session followed by manual lymph drainge of the LUE and L breast. Pt was assisted with getting pictures of her movement into abduction/flexion and of the cording in her L axilla for her personal use to work on scapular stabilization and cord release at home. Pt is beginning to feel more confident in caring for her cording/mobility at home and is ready to decrease her frequency. Discussed the role of inflammation with lymphedema and cording and  pt will return in 2 weeks if she notices any changes.    Personal Factors and Comorbidities  Comorbidity 2    Comorbidities  breast surgery with seroma. previous history of increased scarring after other surgeries    Examination-Activity Limitations  Reach Overhead    Rehab Potential  Good    PT Frequency  2x / week    PT Duration  6 weeks    PT Treatment/Interventions  ADLs/Self Care  Home Management;Therapeutic exercise;Orthotic Fit/Training;Patient/family education;Manual techniques;Manual lymph drainage;Passive range of motion;Scar mobilization;Taping;Functional mobility training;DME Instruction;Therapeutic activities;Neuromuscular re-education;Dry needling    PT Next Visit Plan  cording, Work on L teres major/minor, serratus and latissimus wing on the L, focus on MLD of the L breast/arm, myofascial release for cording    PT Home Exercise Plan  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    Consulted and Agree with Plan of Care  Patient       Patient will benefit from skilled therapeutic intervention in order to improve the following deficits and impairments:  Pain, Impaired UE functional use, Impaired flexibility, Increased fascial restricitons, Decreased strength, Decreased knowledge of use of DME, Decreased range of motion, Decreased scar mobility, Increased edema, Impaired perceived functional ability, Decreased skin integrity, Postural dysfunction  Visit Diagnosis: Aftercare following surgery for neoplasm  Stiffness of left shoulder, not elsewhere classified  Muscle weakness (generalized)  Acute pain of left shoulder     Problem List Patient Active Problem List   Diagnosis Date Noted  . Numbness 02/04/2016  . Cognitive changes 02/04/2016  . Other fatigue 02/04/2016   PHYSICAL THERAPY DISCHARGE SUMMARY  Plan:                                                    Patient goals were met. Patient is being discharged due to  meeting the stated rehab goals.  ?????      Ander Purpura, PT 09/21/2019, 5:15 PM  [place], [place], [address] Phone: [phone]   Fax:  [phone]  Name: Sheena Jarvis MRN: [id number] Date of Birth: June 05, 1970

## 2019-09-23 ENCOUNTER — Ambulatory Visit: Payer: Federal, State, Local not specified - PPO

## 2019-09-29 IMAGING — MR MR BREAST BX W LOC DEV 1ST LESION IMAGE BX SPEC MR GUIDE*L*
8 of 10 series · 33 of 48 positions shown · IV contrast (gadavist)
Comparison: Previous exams.
COMPARISON: Previous exams.

Addendum:
CLINICAL DATA: 48-year-old female for tissue sampling of 1.4 cm
linear enhancement within the INNER LEFT breast.

EXAM:
MRI GUIDED CORE NEEDLE BIOPSY OF THE LEFT BREAST
TECHNIQUE: Multiplanar, multisequence MR imaging of the LEFT breast was
performed both before and after administration of intravenous
contrast.
CONTRAST:  5 ML GADAVIST IV SOLN

[Series 2: fiducial unilateral · sagittal · 2.0mm · 1.33mm/px · 1 of 52 slices shown (1 of 2)]
[im 1/52]
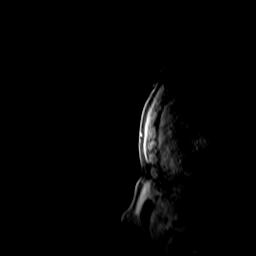

[Series 3: dynamic pre · axial · non-contrast · 1.3mm · 0.73mm/px · z∈[-96,+90]mm · 5 of 144 slices shown (1 of 2)]
[im 1/144]
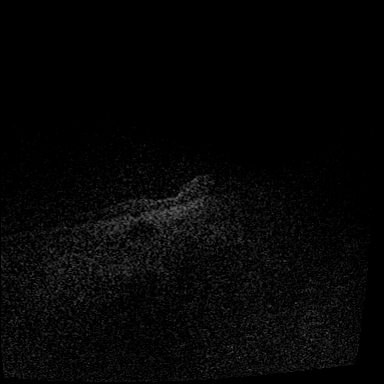
[im 36/144]
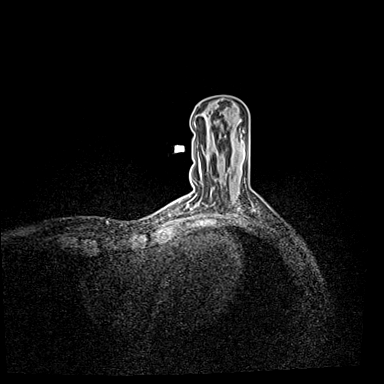
[im 72/144]
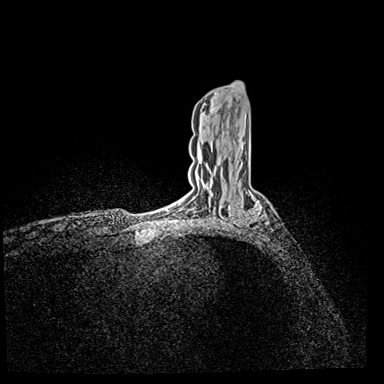
[im 108/144]
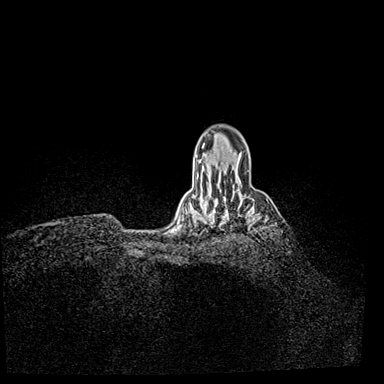
[im 144/144]
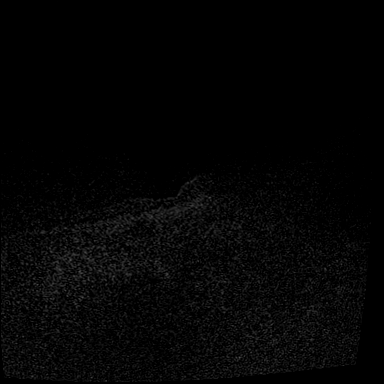

[Series 5: fiducial unilateral · sagittal · 2.0mm · 1.33mm/px · 1 of 52 slices shown (2 of 2)]
[im 1/52]
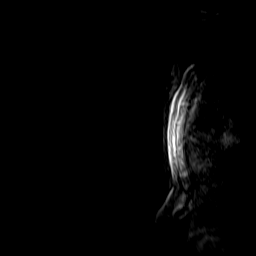

[Series 6: dynamic pre · axial · non-contrast · 1.3mm · 0.73mm/px · z∈[-103,+83]mm · 5 of 144 slices shown (2 of 2)]
[im 1/144]
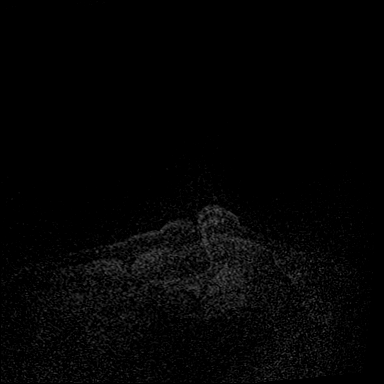
[im 36/144]
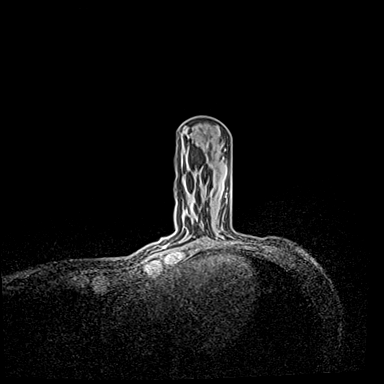
[im 72/144]
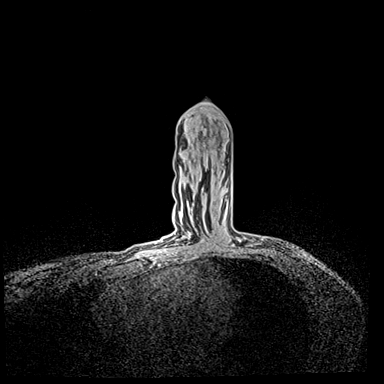
[im 108/144]
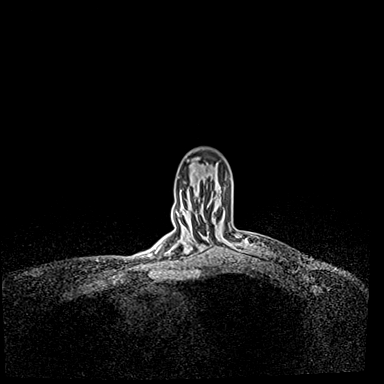
[im 144/144]
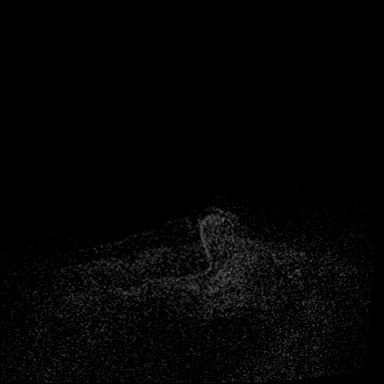

[Series 7: dynamic post 20 · axial · 1.3mm · 0.73mm/px · z∈[-103,+83]mm · 6 of 144 slices shown (1 of 2)]
[im 1/144]
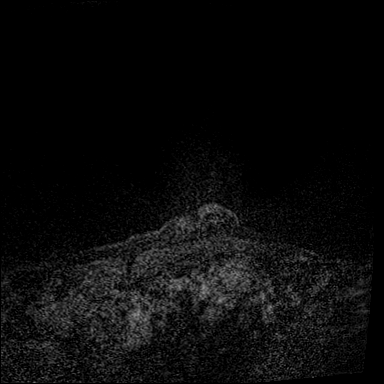
[im 29/144]
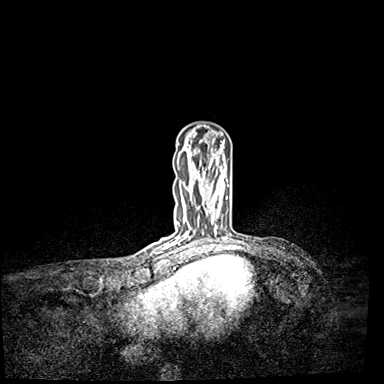
[im 58/144]
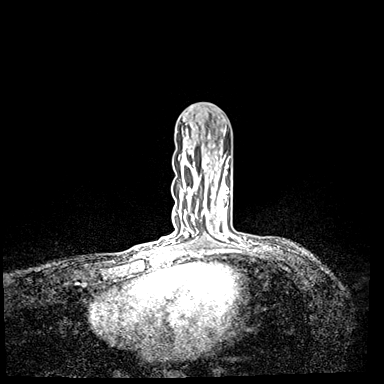
[im 86/144]
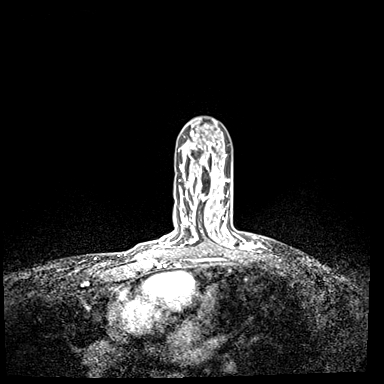
[im 115/144]
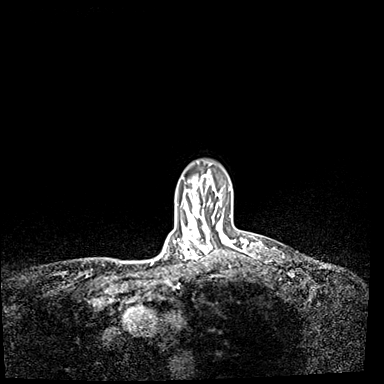
[im 144/144]
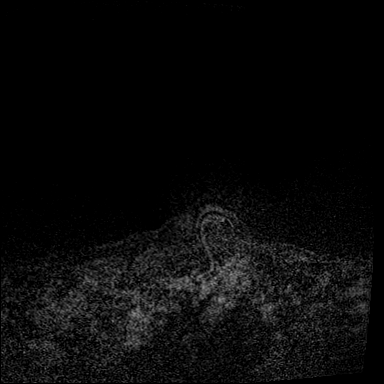

[Series 8: dynamic post 20 · axial · 1.3mm · 0.73mm/px · z∈[-103,+83]mm · 6 of 144 slices shown (2 of 2)]
[im 1/144]
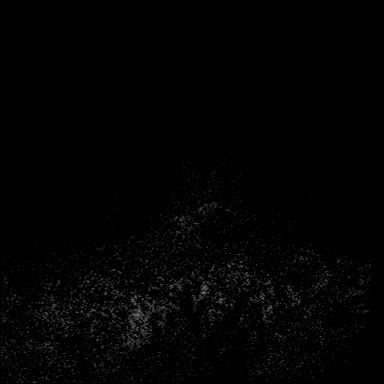
[im 29/144]
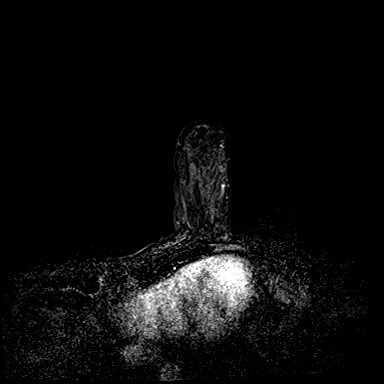
[im 58/144]
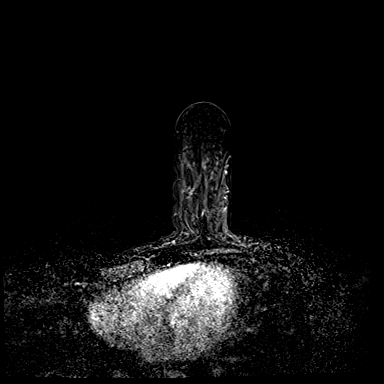
[im 86/144]
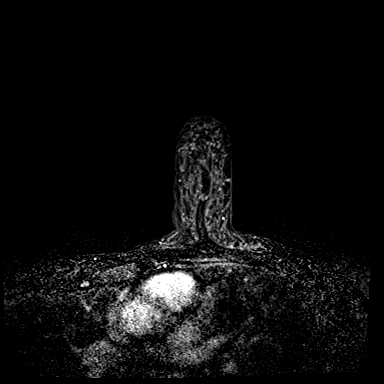
[im 115/144]
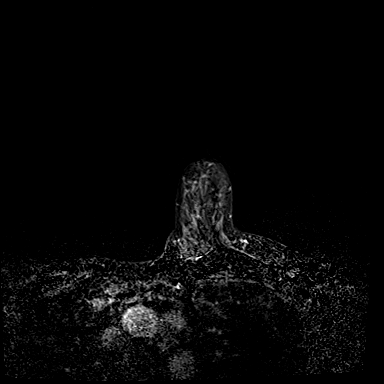
[im 144/144]
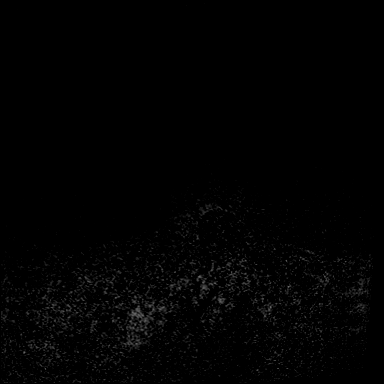

[Series 9: needle confirmation · axial · 1.3mm · 0.73mm/px · z∈[-103,+83]mm · 6 of 144 slices shown]
[im 1/144]
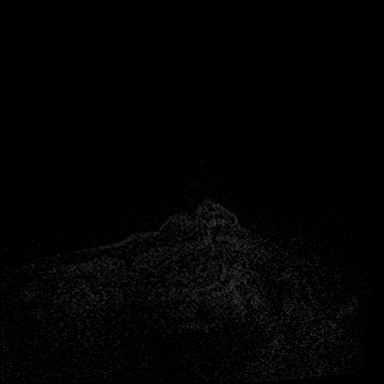
[im 29/144]
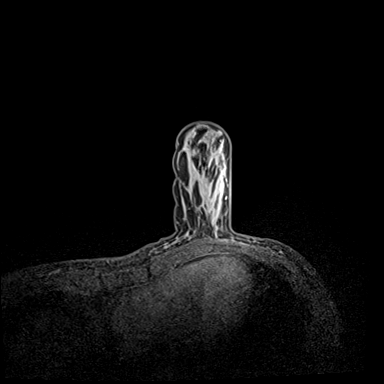
[im 58/144]
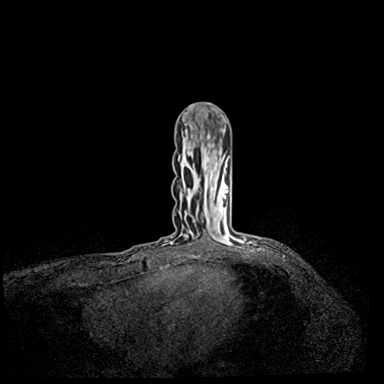
[im 86/144]
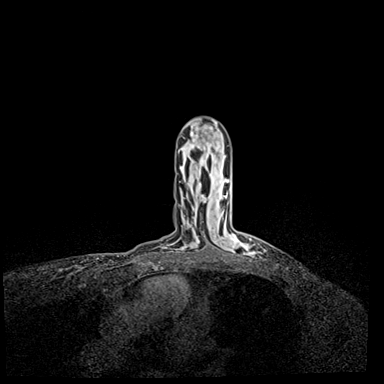
[im 115/144]
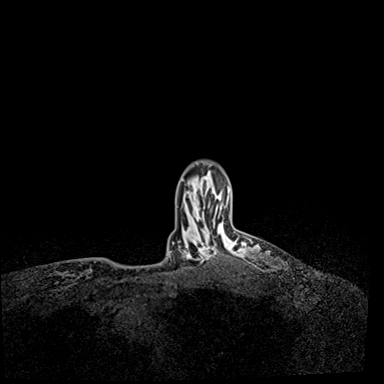
[im 144/144]
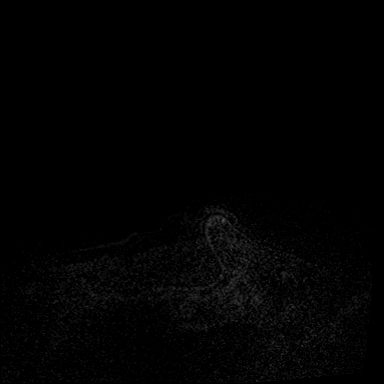

[Series 10: needle confirmation_sub · axial · 1.3mm · 0.73mm/px · z∈[-103,-29]mm · 3 of 144 slices shown]
[im 1/144]
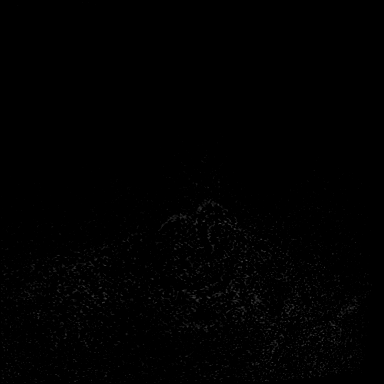
[im 29/144]
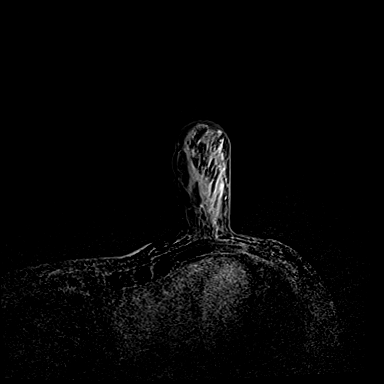
[im 58/144]
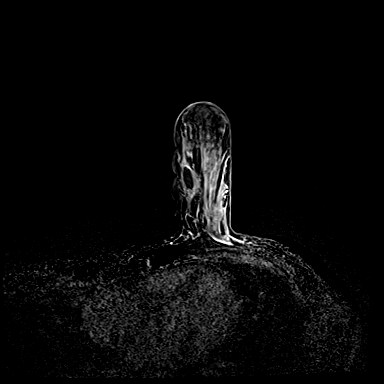

[33 of 48 positions shown; findings below may reference images not displayed]

FINDINGS: I met with the patient, and we discussed the procedure of MRI guided
biopsy, including risks, benefits, and alternatives. Specifically,
we discussed the risks of infection, bleeding, tissue injury, clip
migration, and inadequate sampling. Informed, written consent was
given. The usual time out protocol was performed immediately prior
to the procedure.

Using sterile technique, 1% Lidocaine, MRI guidance, and a 9 gauge
vacuum assisted device, biopsy was performed of the 1.4 cm linear
enhancement within the INNER LEFT breast using a MEDIAL approach. At
the conclusion of the procedure, a BARBELL tissue marker clip was
deployed into the biopsy cavity. Follow-up 2-view mammogram was
performed and dictated separately.
IMPRESSION: MRI guided biopsy of 1.4 cm INNER LEFT br[REDACTED]ar enhancement. No
apparent complications.

ADDENDUM:
Pathology revealed FIBROCYSTIC AND COLUMNAR CELLS CHANGES,
PSEUDOANGIOMATOUS STROMAL HYPERPLASIA of the LEFT breast, inner.
This was found to be concordant by Dr. Negril Norman.

Pathology results were discussed with the patient by telephone. The
patient reported doing well after the biopsy with tenderness at the
site. Post biopsy instructions and care were reviewed and questions
were answered. The patient was encouraged to call The [REDACTED]

The patient was instructed to return for bilateral breast MRI in 6
months per protocol and informed a reminder notice would be sent
regarding this appointment.

Pathology results reported by Rtoyota Joshjax, RN on 01/13/2019.

*** End of Addendum ***
FINDINGS: I met with the patient, and we discussed the procedure of MRI guided
biopsy, including risks, benefits, and alternatives. Specifically,
we discussed the risks of infection, bleeding, tissue injury, clip
migration, and inadequate sampling. Informed, written consent was
given. The usual time out protocol was performed immediately prior
to the procedure.

Using sterile technique, 1% Lidocaine, MRI guidance, and a 9 gauge
vacuum assisted device, biopsy was performed of the 1.4 cm linear
enhancement within the INNER LEFT breast using a MEDIAL approach. At
the conclusion of the procedure, a BARBELL tissue marker clip was
deployed into the biopsy cavity. Follow-up 2-view mammogram was
performed and dictated separately.
IMPRESSION: MRI guided biopsy of 1.4 cm INNER LEFT br[REDACTED]ar enhancement. No
apparent complications.

## 2019-11-14 DIAGNOSIS — E639 Nutritional deficiency, unspecified: Secondary | ICD-10-CM | POA: Diagnosis not present

## 2019-11-14 DIAGNOSIS — K580 Irritable bowel syndrome with diarrhea: Secondary | ICD-10-CM | POA: Diagnosis not present

## 2019-11-14 DIAGNOSIS — Z8249 Family history of ischemic heart disease and other diseases of the circulatory system: Secondary | ICD-10-CM | POA: Diagnosis not present

## 2019-11-14 DIAGNOSIS — R5383 Other fatigue: Secondary | ICD-10-CM | POA: Diagnosis not present

## 2019-11-14 DIAGNOSIS — E559 Vitamin D deficiency, unspecified: Secondary | ICD-10-CM | POA: Diagnosis not present

## 2019-11-14 DIAGNOSIS — E611 Iron deficiency: Secondary | ICD-10-CM | POA: Diagnosis not present

## 2019-12-17 ENCOUNTER — Telehealth: Payer: Federal, State, Local not specified - PPO | Admitting: Emergency Medicine

## 2019-12-17 DIAGNOSIS — R21 Rash and other nonspecific skin eruption: Secondary | ICD-10-CM

## 2019-12-17 MED ORDER — PREDNISONE 10 MG PO TABS
10.0000 mg | ORAL_TABLET | Freq: Every day | ORAL | 0 refills | Status: AC
Start: 2019-12-17 — End: ?

## 2019-12-17 NOTE — Progress Notes (Signed)
Time spent: 10 min  Sheena Jarvis,   We are sorry that you are not feeing well.  Here is how we plan to help!  Based on what you have shared with me it looks like you have had an allergic or hypersensitivity reaction to the oily resin from a group of plants.  This resin is very sticky, so it easily attaches to your skin, clothing, tools equipment, and pet's fur.    This blistering rash is often called poison ivy rash although it can come from contact with the leaves, stems and roots of poison ivy, poison oak and poison sumac, etc.  The oily resin contains urushiol (u-ROO-she-ol) that produces a skin rash on exposed skin.  The severity of the rash depends on the amount of urushiol that gets on your skin.  A section of skin with more urushiol on it may develop a rash sooner.  The rash usually develops 12-48 hours after exposure and can last two to three weeks.  Your skin must come in direct contact with the plant's oil to be affected.  Blister fluid doesn't spread the rash.  However, if you come into contact with a piece of clothing or pet fur that has urushiol on it, the rash may spread out.  You can also transfer the oil to other parts of your body with your fingers.  Often the rash looks like a straight line because of the way the plant brushes against your skin.  Since your rash has been persistent despite topical medicines, I have prescribed an oral corticosteroid.  Please follow these recommendations:  I have sent a prednisone dose pack to your chosen pharmacy. Be sure to follow the instructions carefully and complete the entire prescription. You may use Benadryl or Caladryl topical lotions to sooth the itch and remember cool, not hot, showers and baths can help relieve the itching!  Place cool, wet compresses on the affected area for 15-30 minutes several times a day.  You may also take oral antihistamines, such as diphenhydramine (Benadryl, others), which may also help you sleep better.  Watch  your skin for any purulent (pus) drainage or red streaking from the site.  If this occurs, contact your provider.  You may require an antibiotic for a skin infection.  Make sure that the clothes you were wearing as well as any towels or sheets that may have come in contact with the oil (urushiol) are washed in detergent and hot water.       I have developed the following plan to treat your condition I am prescribing a two week course of steroids (37 tablets of 10 mg prednisone).  Days 1-4 take 4 tablets (40 mg) daily  Days 5-8 take 3 tablets (30 mg) daily, Days 9-11 take 2 tablets (20 mg) daily, Days 12-14 take 1 tablet (10 mg) daily.    What can you do to prevent this rash?  Avoid the plants.  Learn how to identify poison ivy, poison oak and poison sumac in all seasons.  When hiking or engaging in other activities that might expose you to these plants, try to stay on cleared pathways.  If camping, make sure you pitch your tent in an area free of these plants.  Keep pets from running through wooded areas so that urushiol doesn't accidentally stick to their fur, which you may touch.  Remove or kill the plants.  In your yard, you can get rid of poison ivy by applying an herbicide or pulling  it out of the ground, including the roots, while wearing heavy gloves.  Afterward remove the gloves and thoroughly wash them and your hands.  Don't burn poison ivy or related plants because the urushiol can be carried by smoke.  Wear protective clothing.  If needed, protect your skin by wearing socks, boots, pants, long sleeves and vinyl gloves.  Wash your skin right away.  Washing off the oil with soap and water within 30 minutes of exposure may reduce your chances of getting a poison ivy rash.  Even washing after an hour or so can help reduce the severity of the rash.  If you walk through some poison ivy and then later touch your shoes, you may get some urushiol on your hands, which may then transfer to your face or  body by touching or rubbing.  If the contaminated object isn't cleaned, the urushiol on it can still cause a skin reaction years later.    Be careful not to reuse towels after you have washed your skin.  Also carefully wash clothing in detergent and hot water to remove all traces of the oil.  Handle contaminated clothing carefully so you don't transfer the urushiol to yourself, furniture, rugs or appliances.  Remember that pets can carry the oil on their fur and paws.  If you think your pet may be contaminated with urushiol, put on some long rubber gloves and give your pet a bath.  Finally, be careful not to burn these plants as the smoke can contain traces of the oil.  Inhaling the smoke may result in difficulty breathing. If that occurred you should see a physician as soon as possible.  See your doctor right away if:   The reaction is severe or widespread  You inhaled the smoke from burning poison ivy and are having difficulty breathing  Your skin continues to swell  The rash affects your eyes, mouth or genitals  Blisters are oozing pus  You develop a fever greater than 100 F (37.8 C)  The rash doesn't get better within a few weeks.  If you scratch the poison ivy rash, bacteria under your fingernails may cause the skin to become infected.  See your doctor if pus starts oozing from the blisters.  Treatment generally includes antibiotics.  Poison ivy treatments are usually limited to self-care methods.  And the rash typically goes away on its own in two to three weeks.     If the rash is widespread or results in a large number of blisters, your doctor may prescribe an oral corticosteroid, such as prednisone.  If a bacterial infection has developed at the rash site, your doctor may give you a prescription for an oral antibiotic.  MAKE SURE YOU   Understand these instructions.  Will watch your condition.  Will get help right away if you are not doing well or get worse.  Thank  you for choosing an e-visit. Your e-visit answers were reviewed by a board certified advanced clinical practitioner to complete your personal care plan. Depending upon the condition, your plan could have included both over the counter or prescription medications.  Please review your pharmacy choice. If there is a problem you may use MyChart messaging to have the prescription routed to another pharmacy.   Your safety is important to [place]. If you have drug allergies check your prescription carefully.  You can use MyChart to ask questions about today's visit, request a non-urgent call back, or ask for a work or school excuse  for 24 hours related to this e-Visit. If it has been greater than 24 hours you will need to follow up with your provider, or enter a new e-Visit to address those concerns.   You will get an email in the next two days asking about your experience. I hope that your e-visit has been valuable and will speed your recovery  Thank you for choosing an e-visit.  I hope you feel better soon,   Sharen Heck, PA-C [facility]

## 2019-12-30 DIAGNOSIS — F411 Generalized anxiety disorder: Secondary | ICD-10-CM | POA: Diagnosis not present

## 2019-12-31 ENCOUNTER — Encounter: Payer: Self-pay | Admitting: Physician Assistant

## 2019-12-31 ENCOUNTER — Telehealth: Payer: Federal, State, Local not specified - PPO | Admitting: Physician Assistant

## 2019-12-31 DIAGNOSIS — R21 Rash and other nonspecific skin eruption: Secondary | ICD-10-CM

## 2019-12-31 MED ORDER — CICLOPIROX OLAMINE 0.77 % EX CREA: TOPICAL_CREAM | Freq: Two times a day (BID) | CUTANEOUS | 0 refills | Status: AC

## 2019-12-31 MED ORDER — HYDROXYZINE HCL 25 MG PO TABS: 25.0000 mg | ORAL_TABLET | Freq: Every evening | ORAL | 0 refills | Status: AC | PRN

## 2019-12-31 NOTE — Addendum Note (Signed)
Addended by: Demetrio Lapping on: 12/31/2019 02:26 PM   Modules accepted: Orders

## 2019-12-31 NOTE — Progress Notes (Signed)
E Visit for Rash  We are sorry that you are not feeling well. Here is how we plan to help!  The rash on the photo you have provided shows that it may be fungal in nature. I have prescribed Ciclopirox, an antifungal cream. Use it twice daily for 10 days. If no improvement in symptoms, please consider a face to face evaluation.     HOME CARE:   Take cool showers and avoid direct sunlight.  Apply cool compress or wet dressings.  Take a bath in an oatmeal bath.  Sprinkle content of one Aveeno packet under running faucet with comfortably warm water.  Bathe for 15-20 minutes, 1-2 times daily.  Pat dry with a towel. Do not rub the rash.  Use hydrocortisone cream.  Take an antihistamine like Benadryl for widespread rashes that itch.  The adult dose of Benadryl is 25-50 mg by mouth 4 times daily.  Caution:  This type of medication may cause sleepiness.  Do not drink alcohol, drive, or operate dangerous machinery while taking antihistamines.  Do not take these medications if you have prostate enlargement.  Read package instructions thoroughly on all medications that you take.  GET HELP RIGHT AWAY IF:   Symptoms don't go away after treatment.  Severe itching that persists.  If you rash spreads or swells.  If you rash begins to smell.  If it blisters and opens or develops a yellow-brown crust.  You develop a fever.  You have a sore throat.  You become short of breath.  MAKE SURE YOU:  Understand these instructions. Will watch your condition. Will get help right away if you are not doing well or get worse.  Thank you for choosing an e-visit. Your e-visit answers were reviewed by a board certified advanced clinical practitioner to complete your personal care plan. Depending upon the condition, your plan could have included both over the counter or prescription medications. Please review your pharmacy choice. Be sure that the pharmacy you have chosen is open so that you can pick up  your prescription now.  If there is a problem you may message your provider in MyChart to have the prescription routed to another pharmacy. Your safety is important to [place]. If you have drug allergies check your prescription carefully.  For the next 24 hours, you can use MyChart to ask questions about today's visit, request a non-urgent call back, or ask for a work or school excuse from your e-visit provider. You will get an email in the next two days asking about your experience. I hope that your e-visit has been valuable and will speed your recovery.     I spent 5-10 minutes on review and completion of this note- Illa Level [facility]

## 2020-01-05 DIAGNOSIS — L237 Allergic contact dermatitis due to plants, except food: Secondary | ICD-10-CM | POA: Diagnosis not present

## 2020-01-20 DIAGNOSIS — Z01419 Encounter for gynecological examination (general) (routine) without abnormal findings: Secondary | ICD-10-CM | POA: Diagnosis not present

## 2020-01-20 DIAGNOSIS — Z681 Body mass index (BMI) 19 or less, adult: Secondary | ICD-10-CM | POA: Diagnosis not present

## 2020-02-03 DIAGNOSIS — F331 Major depressive disorder, recurrent, moderate: Secondary | ICD-10-CM | POA: Diagnosis not present

## 2020-02-03 DIAGNOSIS — Z630 Problems in relationship with spouse or partner: Secondary | ICD-10-CM | POA: Diagnosis not present

## 2020-02-10 DIAGNOSIS — Z630 Problems in relationship with spouse or partner: Secondary | ICD-10-CM | POA: Diagnosis not present

## 2020-02-10 DIAGNOSIS — F331 Major depressive disorder, recurrent, moderate: Secondary | ICD-10-CM | POA: Diagnosis not present

## 2020-02-21 DIAGNOSIS — Z630 Problems in relationship with spouse or partner: Secondary | ICD-10-CM | POA: Diagnosis not present

## 2020-02-21 DIAGNOSIS — F331 Major depressive disorder, recurrent, moderate: Secondary | ICD-10-CM | POA: Diagnosis not present

## 2020-03-06 DIAGNOSIS — F331 Major depressive disorder, recurrent, moderate: Secondary | ICD-10-CM | POA: Diagnosis not present

## 2020-03-06 DIAGNOSIS — Z630 Problems in relationship with spouse or partner: Secondary | ICD-10-CM | POA: Diagnosis not present

## 2020-03-09 DIAGNOSIS — Z630 Problems in relationship with spouse or partner: Secondary | ICD-10-CM | POA: Diagnosis not present

## 2020-03-09 DIAGNOSIS — F331 Major depressive disorder, recurrent, moderate: Secondary | ICD-10-CM | POA: Diagnosis not present

## 2020-03-16 DIAGNOSIS — F331 Major depressive disorder, recurrent, moderate: Secondary | ICD-10-CM | POA: Diagnosis not present

## 2020-03-16 DIAGNOSIS — Z630 Problems in relationship with spouse or partner: Secondary | ICD-10-CM | POA: Diagnosis not present

## 2020-03-20 DIAGNOSIS — F331 Major depressive disorder, recurrent, moderate: Secondary | ICD-10-CM | POA: Diagnosis not present

## 2020-03-20 DIAGNOSIS — Z630 Problems in relationship with spouse or partner: Secondary | ICD-10-CM | POA: Diagnosis not present

## 2020-03-23 DIAGNOSIS — F331 Major depressive disorder, recurrent, moderate: Secondary | ICD-10-CM | POA: Diagnosis not present

## 2020-03-23 DIAGNOSIS — Z630 Problems in relationship with spouse or partner: Secondary | ICD-10-CM | POA: Diagnosis not present

## 2020-04-03 DIAGNOSIS — F331 Major depressive disorder, recurrent, moderate: Secondary | ICD-10-CM | POA: Diagnosis not present

## 2020-04-03 DIAGNOSIS — Z630 Problems in relationship with spouse or partner: Secondary | ICD-10-CM | POA: Diagnosis not present

## 2020-04-13 DIAGNOSIS — F331 Major depressive disorder, recurrent, moderate: Secondary | ICD-10-CM | POA: Diagnosis not present

## 2020-04-13 DIAGNOSIS — Z630 Problems in relationship with spouse or partner: Secondary | ICD-10-CM | POA: Diagnosis not present

## 2020-04-17 DIAGNOSIS — F331 Major depressive disorder, recurrent, moderate: Secondary | ICD-10-CM | POA: Diagnosis not present

## 2020-04-17 DIAGNOSIS — Z630 Problems in relationship with spouse or partner: Secondary | ICD-10-CM | POA: Diagnosis not present

## 2020-04-20 DIAGNOSIS — Z630 Problems in relationship with spouse or partner: Secondary | ICD-10-CM | POA: Diagnosis not present

## 2020-04-20 DIAGNOSIS — F331 Major depressive disorder, recurrent, moderate: Secondary | ICD-10-CM | POA: Diagnosis not present

## 2020-04-24 DIAGNOSIS — N920 Excessive and frequent menstruation with regular cycle: Secondary | ICD-10-CM | POA: Diagnosis not present

## 2020-04-24 DIAGNOSIS — E559 Vitamin D deficiency, unspecified: Secondary | ICD-10-CM | POA: Diagnosis not present

## 2020-04-24 DIAGNOSIS — E7210 Disorders of sulfur-bearing amino-acid metabolism, unspecified: Secondary | ICD-10-CM | POA: Diagnosis not present

## 2020-04-24 DIAGNOSIS — E639 Nutritional deficiency, unspecified: Secondary | ICD-10-CM | POA: Diagnosis not present

## 2020-04-24 DIAGNOSIS — R5383 Other fatigue: Secondary | ICD-10-CM | POA: Diagnosis not present

## 2020-04-24 DIAGNOSIS — K580 Irritable bowel syndrome with diarrhea: Secondary | ICD-10-CM | POA: Diagnosis not present

## 2020-04-24 DIAGNOSIS — Z8249 Family history of ischemic heart disease and other diseases of the circulatory system: Secondary | ICD-10-CM | POA: Diagnosis not present

## 2020-04-24 DIAGNOSIS — E611 Iron deficiency: Secondary | ICD-10-CM | POA: Diagnosis not present

## 2020-05-01 DIAGNOSIS — F331 Major depressive disorder, recurrent, moderate: Secondary | ICD-10-CM | POA: Diagnosis not present

## 2020-05-01 DIAGNOSIS — Z630 Problems in relationship with spouse or partner: Secondary | ICD-10-CM | POA: Diagnosis not present

## 2020-05-04 DIAGNOSIS — F331 Major depressive disorder, recurrent, moderate: Secondary | ICD-10-CM | POA: Diagnosis not present

## 2020-05-04 DIAGNOSIS — Z630 Problems in relationship with spouse or partner: Secondary | ICD-10-CM | POA: Diagnosis not present

## 2020-05-11 DIAGNOSIS — Z630 Problems in relationship with spouse or partner: Secondary | ICD-10-CM | POA: Diagnosis not present

## 2020-05-11 DIAGNOSIS — F331 Major depressive disorder, recurrent, moderate: Secondary | ICD-10-CM | POA: Diagnosis not present

## 2020-05-15 DIAGNOSIS — Z630 Problems in relationship with spouse or partner: Secondary | ICD-10-CM | POA: Diagnosis not present

## 2020-05-15 DIAGNOSIS — M76891 Other specified enthesopathies of right lower limb, excluding foot: Secondary | ICD-10-CM | POA: Diagnosis not present

## 2020-05-15 DIAGNOSIS — F331 Major depressive disorder, recurrent, moderate: Secondary | ICD-10-CM | POA: Diagnosis not present

## 2020-05-18 DIAGNOSIS — F331 Major depressive disorder, recurrent, moderate: Secondary | ICD-10-CM | POA: Diagnosis not present

## 2020-05-18 DIAGNOSIS — Z630 Problems in relationship with spouse or partner: Secondary | ICD-10-CM | POA: Diagnosis not present

## 2020-06-01 DIAGNOSIS — Z630 Problems in relationship with spouse or partner: Secondary | ICD-10-CM | POA: Diagnosis not present

## 2020-06-01 DIAGNOSIS — F331 Major depressive disorder, recurrent, moderate: Secondary | ICD-10-CM | POA: Diagnosis not present

## 2020-06-05 DIAGNOSIS — Z630 Problems in relationship with spouse or partner: Secondary | ICD-10-CM | POA: Diagnosis not present

## 2020-06-05 DIAGNOSIS — F331 Major depressive disorder, recurrent, moderate: Secondary | ICD-10-CM | POA: Diagnosis not present

## 2020-06-08 DIAGNOSIS — F331 Major depressive disorder, recurrent, moderate: Secondary | ICD-10-CM | POA: Diagnosis not present

## 2020-06-08 DIAGNOSIS — Z630 Problems in relationship with spouse or partner: Secondary | ICD-10-CM | POA: Diagnosis not present

## 2020-06-12 DIAGNOSIS — Z630 Problems in relationship with spouse or partner: Secondary | ICD-10-CM | POA: Diagnosis not present

## 2020-06-12 DIAGNOSIS — F331 Major depressive disorder, recurrent, moderate: Secondary | ICD-10-CM | POA: Diagnosis not present

## 2020-06-19 DIAGNOSIS — F331 Major depressive disorder, recurrent, moderate: Secondary | ICD-10-CM | POA: Diagnosis not present

## 2020-06-19 DIAGNOSIS — Z630 Problems in relationship with spouse or partner: Secondary | ICD-10-CM | POA: Diagnosis not present

## 2020-06-22 DIAGNOSIS — F331 Major depressive disorder, recurrent, moderate: Secondary | ICD-10-CM | POA: Diagnosis not present

## 2020-06-22 DIAGNOSIS — N920 Excessive and frequent menstruation with regular cycle: Secondary | ICD-10-CM | POA: Diagnosis not present

## 2020-06-22 DIAGNOSIS — Z630 Problems in relationship with spouse or partner: Secondary | ICD-10-CM | POA: Diagnosis not present

## 2020-06-22 DIAGNOSIS — N951 Menopausal and female climacteric states: Secondary | ICD-10-CM | POA: Diagnosis not present

## 2020-06-22 DIAGNOSIS — E611 Iron deficiency: Secondary | ICD-10-CM | POA: Diagnosis not present

## 2020-06-22 DIAGNOSIS — Z8249 Family history of ischemic heart disease and other diseases of the circulatory system: Secondary | ICD-10-CM | POA: Diagnosis not present

## 2020-06-22 DIAGNOSIS — K580 Irritable bowel syndrome with diarrhea: Secondary | ICD-10-CM | POA: Diagnosis not present

## 2020-06-22 DIAGNOSIS — E639 Nutritional deficiency, unspecified: Secondary | ICD-10-CM | POA: Diagnosis not present

## 2020-06-22 DIAGNOSIS — E559 Vitamin D deficiency, unspecified: Secondary | ICD-10-CM | POA: Diagnosis not present

## 2020-06-22 DIAGNOSIS — E7210 Disorders of sulfur-bearing amino-acid metabolism, unspecified: Secondary | ICD-10-CM | POA: Diagnosis not present

## 2020-06-22 DIAGNOSIS — R5383 Other fatigue: Secondary | ICD-10-CM | POA: Diagnosis not present

## 2020-06-26 DIAGNOSIS — Z630 Problems in relationship with spouse or partner: Secondary | ICD-10-CM | POA: Diagnosis not present

## 2020-06-26 DIAGNOSIS — F331 Major depressive disorder, recurrent, moderate: Secondary | ICD-10-CM | POA: Diagnosis not present

## 2020-07-06 DIAGNOSIS — F331 Major depressive disorder, recurrent, moderate: Secondary | ICD-10-CM | POA: Diagnosis not present

## 2020-07-06 DIAGNOSIS — Z630 Problems in relationship with spouse or partner: Secondary | ICD-10-CM | POA: Diagnosis not present

## 2020-07-13 DIAGNOSIS — F331 Major depressive disorder, recurrent, moderate: Secondary | ICD-10-CM | POA: Diagnosis not present

## 2020-07-13 DIAGNOSIS — Z630 Problems in relationship with spouse or partner: Secondary | ICD-10-CM | POA: Diagnosis not present

## 2020-07-17 DIAGNOSIS — Z630 Problems in relationship with spouse or partner: Secondary | ICD-10-CM | POA: Diagnosis not present

## 2020-07-17 DIAGNOSIS — F331 Major depressive disorder, recurrent, moderate: Secondary | ICD-10-CM | POA: Diagnosis not present

## 2020-07-20 DIAGNOSIS — Z630 Problems in relationship with spouse or partner: Secondary | ICD-10-CM | POA: Diagnosis not present

## 2020-07-20 DIAGNOSIS — F331 Major depressive disorder, recurrent, moderate: Secondary | ICD-10-CM | POA: Diagnosis not present

## 2020-07-24 DIAGNOSIS — F331 Major depressive disorder, recurrent, moderate: Secondary | ICD-10-CM | POA: Diagnosis not present

## 2020-07-24 DIAGNOSIS — Z630 Problems in relationship with spouse or partner: Secondary | ICD-10-CM | POA: Diagnosis not present

## 2020-07-27 DIAGNOSIS — F331 Major depressive disorder, recurrent, moderate: Secondary | ICD-10-CM | POA: Diagnosis not present

## 2020-07-27 DIAGNOSIS — Z630 Problems in relationship with spouse or partner: Secondary | ICD-10-CM | POA: Diagnosis not present

## 2020-07-31 DIAGNOSIS — F331 Major depressive disorder, recurrent, moderate: Secondary | ICD-10-CM | POA: Diagnosis not present

## 2020-07-31 DIAGNOSIS — Z630 Problems in relationship with spouse or partner: Secondary | ICD-10-CM | POA: Diagnosis not present

## 2020-08-03 DIAGNOSIS — Z630 Problems in relationship with spouse or partner: Secondary | ICD-10-CM | POA: Diagnosis not present

## 2020-08-03 DIAGNOSIS — F331 Major depressive disorder, recurrent, moderate: Secondary | ICD-10-CM | POA: Diagnosis not present

## 2020-08-07 DIAGNOSIS — Z630 Problems in relationship with spouse or partner: Secondary | ICD-10-CM | POA: Diagnosis not present

## 2020-08-07 DIAGNOSIS — F331 Major depressive disorder, recurrent, moderate: Secondary | ICD-10-CM | POA: Diagnosis not present

## 2020-08-15 DIAGNOSIS — Z630 Problems in relationship with spouse or partner: Secondary | ICD-10-CM | POA: Diagnosis not present

## 2020-08-15 DIAGNOSIS — F331 Major depressive disorder, recurrent, moderate: Secondary | ICD-10-CM | POA: Diagnosis not present

## 2020-08-24 DIAGNOSIS — F331 Major depressive disorder, recurrent, moderate: Secondary | ICD-10-CM | POA: Diagnosis not present

## 2020-08-24 DIAGNOSIS — Z630 Problems in relationship with spouse or partner: Secondary | ICD-10-CM | POA: Diagnosis not present

## 2020-08-28 DIAGNOSIS — R0989 Other specified symptoms and signs involving the circulatory and respiratory systems: Secondary | ICD-10-CM | POA: Diagnosis not present

## 2020-08-28 DIAGNOSIS — J069 Acute upper respiratory infection, unspecified: Secondary | ICD-10-CM | POA: Diagnosis not present

## 2020-08-28 DIAGNOSIS — F331 Major depressive disorder, recurrent, moderate: Secondary | ICD-10-CM | POA: Diagnosis not present

## 2020-08-28 DIAGNOSIS — R059 Cough, unspecified: Secondary | ICD-10-CM | POA: Diagnosis not present

## 2020-08-28 DIAGNOSIS — Z630 Problems in relationship with spouse or partner: Secondary | ICD-10-CM | POA: Diagnosis not present

## 2020-08-28 DIAGNOSIS — Z20822 Contact with and (suspected) exposure to covid-19: Secondary | ICD-10-CM | POA: Diagnosis not present

## 2020-08-28 DIAGNOSIS — R0981 Nasal congestion: Secondary | ICD-10-CM | POA: Diagnosis not present

## 2020-08-31 DIAGNOSIS — Z630 Problems in relationship with spouse or partner: Secondary | ICD-10-CM | POA: Diagnosis not present

## 2020-08-31 DIAGNOSIS — F331 Major depressive disorder, recurrent, moderate: Secondary | ICD-10-CM | POA: Diagnosis not present

## 2020-09-04 DIAGNOSIS — F331 Major depressive disorder, recurrent, moderate: Secondary | ICD-10-CM | POA: Diagnosis not present

## 2020-09-04 DIAGNOSIS — Z630 Problems in relationship with spouse or partner: Secondary | ICD-10-CM | POA: Diagnosis not present

## 2020-09-11 DIAGNOSIS — Z630 Problems in relationship with spouse or partner: Secondary | ICD-10-CM | POA: Diagnosis not present

## 2020-09-11 DIAGNOSIS — F331 Major depressive disorder, recurrent, moderate: Secondary | ICD-10-CM | POA: Diagnosis not present

## 2020-09-14 DIAGNOSIS — F331 Major depressive disorder, recurrent, moderate: Secondary | ICD-10-CM | POA: Diagnosis not present

## 2020-09-14 DIAGNOSIS — Z630 Problems in relationship with spouse or partner: Secondary | ICD-10-CM | POA: Diagnosis not present

## 2020-09-18 DIAGNOSIS — F331 Major depressive disorder, recurrent, moderate: Secondary | ICD-10-CM | POA: Diagnosis not present

## 2020-09-18 DIAGNOSIS — Z630 Problems in relationship with spouse or partner: Secondary | ICD-10-CM | POA: Diagnosis not present

## 2020-09-25 DIAGNOSIS — F331 Major depressive disorder, recurrent, moderate: Secondary | ICD-10-CM | POA: Diagnosis not present

## 2020-09-25 DIAGNOSIS — Z630 Problems in relationship with spouse or partner: Secondary | ICD-10-CM | POA: Diagnosis not present

## 2020-10-02 DIAGNOSIS — F331 Major depressive disorder, recurrent, moderate: Secondary | ICD-10-CM | POA: Diagnosis not present

## 2020-10-02 DIAGNOSIS — Z630 Problems in relationship with spouse or partner: Secondary | ICD-10-CM | POA: Diagnosis not present

## 2020-10-09 DIAGNOSIS — F331 Major depressive disorder, recurrent, moderate: Secondary | ICD-10-CM | POA: Diagnosis not present

## 2020-10-09 DIAGNOSIS — Z630 Problems in relationship with spouse or partner: Secondary | ICD-10-CM | POA: Diagnosis not present

## 2020-10-30 DIAGNOSIS — F331 Major depressive disorder, recurrent, moderate: Secondary | ICD-10-CM | POA: Diagnosis not present

## 2020-10-30 DIAGNOSIS — Z630 Problems in relationship with spouse or partner: Secondary | ICD-10-CM | POA: Diagnosis not present

## 2020-11-13 DIAGNOSIS — F331 Major depressive disorder, recurrent, moderate: Secondary | ICD-10-CM | POA: Diagnosis not present

## 2020-11-13 DIAGNOSIS — Z630 Problems in relationship with spouse or partner: Secondary | ICD-10-CM | POA: Diagnosis not present

## 2020-11-20 DIAGNOSIS — Z630 Problems in relationship with spouse or partner: Secondary | ICD-10-CM | POA: Diagnosis not present

## 2020-11-20 DIAGNOSIS — F331 Major depressive disorder, recurrent, moderate: Secondary | ICD-10-CM | POA: Diagnosis not present

## 2020-11-27 DIAGNOSIS — F331 Major depressive disorder, recurrent, moderate: Secondary | ICD-10-CM | POA: Diagnosis not present

## 2020-11-27 DIAGNOSIS — Z630 Problems in relationship with spouse or partner: Secondary | ICD-10-CM | POA: Diagnosis not present

## 2020-12-07 DIAGNOSIS — F331 Major depressive disorder, recurrent, moderate: Secondary | ICD-10-CM | POA: Diagnosis not present

## 2020-12-07 DIAGNOSIS — Z630 Problems in relationship with spouse or partner: Secondary | ICD-10-CM | POA: Diagnosis not present

## 2020-12-12 DIAGNOSIS — F4320 Adjustment disorder, unspecified: Secondary | ICD-10-CM | POA: Diagnosis not present

## 2020-12-12 DIAGNOSIS — Z630 Problems in relationship with spouse or partner: Secondary | ICD-10-CM | POA: Diagnosis not present

## 2020-12-13 DIAGNOSIS — Z630 Problems in relationship with spouse or partner: Secondary | ICD-10-CM | POA: Diagnosis not present

## 2020-12-13 DIAGNOSIS — F331 Major depressive disorder, recurrent, moderate: Secondary | ICD-10-CM | POA: Diagnosis not present

## 2020-12-23 ENCOUNTER — Encounter (HOSPITAL_COMMUNITY): Payer: Self-pay | Admitting: Emergency Medicine

## 2020-12-23 ENCOUNTER — Other Ambulatory Visit: Payer: Self-pay

## 2020-12-23 ENCOUNTER — Emergency Department (HOSPITAL_COMMUNITY)
Admission: EM | Admit: 2020-12-23 | Discharge: 2020-12-23 | Disposition: A | Discharge status: Home / Self Care | Payer: Federal, State, Local not specified - PPO | Attending: Emergency Medicine | Admitting: Emergency Medicine

## 2020-12-23 DIAGNOSIS — Z91040 Latex allergy status: Secondary | ICD-10-CM | POA: Diagnosis not present

## 2020-12-23 DIAGNOSIS — U071 COVID-19: Secondary | ICD-10-CM | POA: Diagnosis not present

## 2020-12-23 DIAGNOSIS — R531 Weakness: Secondary | ICD-10-CM | POA: Diagnosis not present

## 2020-12-23 LAB — COMPREHENSIVE METABOLIC PANEL
ALT: 19 U/L (ref 0–44)
AST: 24 U/L (ref 15–41)
Albumin: 4.1 g/dL (ref 3.5–5.0)
Alkaline Phosphatase: 25 U/L — ABNORMAL LOW (ref 38–126)
Anion gap: 7 (ref 5–15)
BUN: 7 mg/dL (ref 6–20)
CO2: 26 mmol/L (ref 22–32)
Calcium: 8.6 mg/dL — ABNORMAL LOW (ref 8.9–10.3)
Chloride: 103 mmol/L (ref 98–111)
Creatinine, Ser: 0.7 mg/dL (ref 0.44–1.00)
GFR, Estimated: >60 mL/min (ref >60)
Glucose, Bld: 85 mg/dL (ref 70–99)
Potassium: 4 mmol/L (ref 3.5–5.1)
Sodium: 136 mmol/L (ref 135–145)
Total Bilirubin: 0.6 mg/dL (ref 0.3–1.2)
Total Protein: 6.5 g/dL (ref 6.5–8.1)

## 2020-12-23 LAB — CBC
HCT: 40.4 % (ref 36.0–46.0)
Hemoglobin: 13.1 g/dL (ref 12.0–15.0)
MCH: 31.9 pg (ref 26.0–34.0)
MCHC: 32.4 g/dL (ref 30.0–36.0)
MCV: 98.3 fL (ref 80.0–100.0)
Platelets: 107 10*3/uL — ABNORMAL LOW (ref 150–400)
RBC: 4.11 MIL/uL (ref 3.87–5.11)
RDW: 12.6 % (ref 11.5–15.5)
WBC: 2.4 10*3/uL — ABNORMAL LOW (ref 4.0–10.5)
nRBC: 0 % (ref 0.0–0.2)

## 2020-12-23 MED ORDER — NIRMATRELVIR/RITONAVIR (PAXLOVID)TABLET
3.0000 | ORAL_TABLET | Freq: Two times a day (BID) | ORAL | Status: DC
  Administered 2020-12-23: 3 via ORAL
  Filled 2020-12-23: qty 30

## 2020-12-23 MED ORDER — ONDANSETRON HCL 4 MG/2ML IJ SOLN
4.0000 mg | Freq: Once | INTRAMUSCULAR | Status: AC
  Administered 2020-12-23: 4 mg via INTRAVENOUS
  Filled 2020-12-23: qty 2

## 2020-12-23 MED ORDER — ONDANSETRON 4 MG PO TBDP
ORAL_TABLET | ORAL | 0 refills | Status: AC
Start: 2020-12-23 — End: ?

## 2020-12-23 MED ORDER — SODIUM CHLORIDE 0.9 % IV BOLUS
1000.0000 mL | Freq: Once | INTRAVENOUS | Status: AC
  Administered 2020-12-23: 1000 mL via INTRAVENOUS

## 2020-12-23 NOTE — ED Provider Notes (Signed)
[facility] EMERGENCY DEPARTMENT Provider Note   CSN: [id number] Arrival date & time: 12/23/20  1403     History Chief Complaint  Patient presents with   Weakness    Sheena Jarvis is a 50 y.o. female.  Patient complains of fever and aches.  He tested positive for COVID 2 days ago  The history is provided by the patient and medical records. No language interpreter was used.  Weakness Severity:  Moderate Onset quality:  Sudden Timing:  Constant Progression:  Waxing and waning Chronicity:  New Context: not alcohol use   Relieved by:  Nothing Worsened by:  Nothing Associated symptoms: no abdominal pain, no chest pain, no cough, no diarrhea, no frequency, no headaches and no seizures       Past Medical History:  Diagnosis Date   Pneumonia    PONV (postoperative nausea and vomiting)    Vision abnormalities     Patient Active Problem List   Diagnosis Date Noted   Numbness 02/04/2016   Cognitive changes 02/04/2016   Other fatigue 02/04/2016    Past Surgical History:  Procedure Laterality Date   APPENDECTOMY     BREAST CYST ASPIRATION Left    HIP ARTHROSCOPY     MASS EXCISION Left 02/10/2019   Procedure: EXCISION OF LEFT BREAST MASS;  Surgeon: Almond Lint, MD;  Location: [facility];  Service: General;  Laterality: Left;   SCAR REVISION Left 06/22/2019   Procedure: LEFT AXILLARY CONTRACTURE RELEASE;  Surgeon: Almond Lint, MD;  Location: [facility];  Service: General;  Laterality: Left;     OB History   No obstetric history on file.     Family History  Problem Relation Age of Onset   Healthy Mother    Heart disease Father    Multiple sclerosis Brother    Multiple sclerosis Maternal Aunt    Breast cancer Maternal Grandmother     Social History   Tobacco Use   Smoking status: Never   Smokeless tobacco: Never  Vaping Use   Vaping Use: Never used  Substance Use Topics   Alcohol use: Yes     Alcohol/week: 3.0 - 4.0 standard drinks    Types: 3 - 4 Glasses of wine per week   Drug use: No    Home Medications Prior to Admission medications   Medication Sig Start Date End Date Taking? Authorizing Provider  Ascorbic Acid (VITAMIN C) POWD Take 2,569 mg by mouth.    [provider]  ciclopirox (LOPROX) 0.77 % cream Apply topically 2 (two) times daily. 12/31/19   Demetrio Lapping, PA-C  hydrOXYzine (ATARAX/VISTARIL) 25 MG tablet Take 1 tablet (25 mg total) by mouth at bedtime as needed. 12/31/19   Demetrio Lapping, PA-C  Multiple Vitamin (MULTIVITAMIN) capsule Take 1 capsule by mouth daily.    Alver Fisher, RN  Omega-3 Fatty Acids (FISH OIL) 1000 MG CAPS Take by mouth.    Alver Fisher, RN  predniSONE (DELTASONE) 10 MG tablet Take 1 tablet (10 mg total) by mouth daily with breakfast. Days 1-4 take 4 tablets (40 mg) daily  Days 5-8 take 3 tablets (30 mg) daily, Days 9-11 take 2 tablets (20 mg) daily, Days 12-14 take 1 tablet (10 mg) daily. 12/17/19   Liberty Handy, PA-C  TURMERIC PO Take by mouth.    [provider]    Allergies    Penicillins, Tetracyclines & related, Latex, and Sulfa antibiotics  Review of Systems  Review of Systems  Constitutional:  Negative for appetite change and fatigue.  HENT:  Negative for congestion, ear discharge and sinus pressure.   Eyes:  Negative for discharge.  Respiratory:  Negative for cough.   Cardiovascular:  Negative for chest pain.  Gastrointestinal:  Negative for abdominal pain and diarrhea.  Genitourinary:  Negative for frequency and hematuria.  Musculoskeletal:  Negative for back pain.  Skin:  Negative for rash.  Neurological:  Positive for weakness. Negative for seizures and headaches.  Psychiatric/Behavioral:  Negative for hallucinations.    Physical Exam Updated Vital Signs BP 102/64   Pulse (!) 58   Temp 98.3 F (36.8 C) (Oral)   Resp 15   SpO2 100%   Physical Exam Vitals reviewed.  Constitutional:       Appearance: She is well-developed.  HENT:     Head: Normocephalic.     Nose: Nose normal.  Eyes:     General: No scleral icterus.    Conjunctiva/sclera: Conjunctivae normal.  Neck:     Thyroid: No thyromegaly.  Cardiovascular:     Rate and Rhythm: Normal rate and regular rhythm.     Heart sounds: No murmur heard.   No friction rub. No gallop.  Pulmonary:     Breath sounds: No stridor. No wheezing or rales.  Chest:     Chest wall: No tenderness.  Abdominal:     General: There is no distension.     Tenderness: There is no abdominal tenderness. There is no rebound.  Musculoskeletal:        General: Normal range of motion.     Cervical back: Neck supple.  Lymphadenopathy:     Cervical: No cervical adenopathy.  Skin:    Findings: No erythema or rash.  Neurological:     Mental Status: She is oriented to person, place, and time.     Motor: No abnormal muscle tone.     Coordination: Coordination normal.  Psychiatric:        Behavior: Behavior normal.    ED Results / Procedures / Treatments   Labs (all labs ordered are listed, but only abnormal results are displayed) Labs Reviewed  CBC - Abnormal; Notable for the following components:      Result Value   WBC 2.4 (*)    Platelets 107 (*)    All other components within normal limits  COMPREHENSIVE METABOLIC PANEL - Abnormal; Notable for the following components:   Calcium 8.6 (*)    Alkaline Phosphatase 25 (*)    All other components within normal limits    EKG None  Radiology No results found.  Procedures Procedures   Medications Ordered in ED Medications  nirmatrelvir/ritonavir EUA (PAXLOVID) 3 tablet (3 tablets Oral Given 12/23/20 1748)  sodium chloride 0.9 % bolus 1,000 mL (1,000 mLs Intravenous New Bag/Given 12/23/20 1646)  ondansetron (ZOFRAN) injection 4 mg (4 mg Intravenous Given 12/23/20 1646)    ED Course  I have reviewed the triage vital signs and the nursing notes.  Pertinent labs & imaging results  that were available during my care of the patient were reviewed by me and considered in my medical decision making (see chart for details).    MDM Rules/Calculators/A&P                         Patient with COVID dehydration.  She is improved with fluids and will follow up as needed Final Clinical Impression(s) / ED Diagnoses Final diagnoses:  COVID-19  Rx / DC Orders ED Discharge Orders     None        Bethann Berkshire, MD 12/23/20 [phone]

## 2020-12-23 NOTE — Discharge Instructions (Addendum)
Drink plenty of fluids.  Take Tylenol or Motrin for pain.  Follow up if not improving

## 2020-12-23 NOTE — ED Triage Notes (Signed)
Pt reports positive COVID on Friday. Since then she has had poor PO intake. Reports nausea, headache, and body aches.

## 2020-12-24 DIAGNOSIS — F4320 Adjustment disorder, unspecified: Secondary | ICD-10-CM | POA: Diagnosis not present

## 2020-12-25 DIAGNOSIS — F331 Major depressive disorder, recurrent, moderate: Secondary | ICD-10-CM | POA: Diagnosis not present

## 2020-12-25 DIAGNOSIS — Z630 Problems in relationship with spouse or partner: Secondary | ICD-10-CM | POA: Diagnosis not present

## 2020-12-28 DIAGNOSIS — R5383 Other fatigue: Secondary | ICD-10-CM | POA: Diagnosis not present

## 2020-12-28 DIAGNOSIS — U071 COVID-19: Secondary | ICD-10-CM | POA: Diagnosis not present

## 2020-12-28 DIAGNOSIS — D509 Iron deficiency anemia, unspecified: Secondary | ICD-10-CM | POA: Diagnosis not present

## 2021-01-01 DIAGNOSIS — R5383 Other fatigue: Secondary | ICD-10-CM | POA: Diagnosis not present

## 2021-01-01 DIAGNOSIS — N920 Excessive and frequent menstruation with regular cycle: Secondary | ICD-10-CM | POA: Diagnosis not present

## 2021-01-01 DIAGNOSIS — E639 Nutritional deficiency, unspecified: Secondary | ICD-10-CM | POA: Diagnosis not present

## 2021-01-01 DIAGNOSIS — E559 Vitamin D deficiency, unspecified: Secondary | ICD-10-CM | POA: Diagnosis not present

## 2021-01-01 DIAGNOSIS — K580 Irritable bowel syndrome with diarrhea: Secondary | ICD-10-CM | POA: Diagnosis not present

## 2021-01-01 DIAGNOSIS — Z8249 Family history of ischemic heart disease and other diseases of the circulatory system: Secondary | ICD-10-CM | POA: Diagnosis not present

## 2021-01-01 DIAGNOSIS — E611 Iron deficiency: Secondary | ICD-10-CM | POA: Diagnosis not present

## 2021-01-01 DIAGNOSIS — E7210 Disorders of sulfur-bearing amino-acid metabolism, unspecified: Secondary | ICD-10-CM | POA: Diagnosis not present

## 2021-01-08 DIAGNOSIS — F4320 Adjustment disorder, unspecified: Secondary | ICD-10-CM | POA: Diagnosis not present

## 2021-01-08 DIAGNOSIS — F331 Major depressive disorder, recurrent, moderate: Secondary | ICD-10-CM | POA: Diagnosis not present

## 2021-01-08 DIAGNOSIS — Z630 Problems in relationship with spouse or partner: Secondary | ICD-10-CM | POA: Diagnosis not present

## 2021-01-14 DIAGNOSIS — F4320 Adjustment disorder, unspecified: Secondary | ICD-10-CM | POA: Diagnosis not present

## 2021-01-15 DIAGNOSIS — Z630 Problems in relationship with spouse or partner: Secondary | ICD-10-CM | POA: Diagnosis not present

## 2021-01-15 DIAGNOSIS — F331 Major depressive disorder, recurrent, moderate: Secondary | ICD-10-CM | POA: Diagnosis not present

## 2021-01-20 DIAGNOSIS — F4320 Adjustment disorder, unspecified: Secondary | ICD-10-CM | POA: Diagnosis not present

## 2021-01-22 DIAGNOSIS — F331 Major depressive disorder, recurrent, moderate: Secondary | ICD-10-CM | POA: Diagnosis not present

## 2021-01-22 DIAGNOSIS — Z630 Problems in relationship with spouse or partner: Secondary | ICD-10-CM | POA: Diagnosis not present

## 2021-01-27 DIAGNOSIS — F4320 Adjustment disorder, unspecified: Secondary | ICD-10-CM | POA: Diagnosis not present

## 2021-01-29 DIAGNOSIS — Z630 Problems in relationship with spouse or partner: Secondary | ICD-10-CM | POA: Diagnosis not present

## 2021-01-29 DIAGNOSIS — F331 Major depressive disorder, recurrent, moderate: Secondary | ICD-10-CM | POA: Diagnosis not present

## 2021-01-30 DIAGNOSIS — F4320 Adjustment disorder, unspecified: Secondary | ICD-10-CM | POA: Diagnosis not present

## 2021-02-03 DIAGNOSIS — F4320 Adjustment disorder, unspecified: Secondary | ICD-10-CM | POA: Diagnosis not present

## 2021-02-05 DIAGNOSIS — Z630 Problems in relationship with spouse or partner: Secondary | ICD-10-CM | POA: Diagnosis not present

## 2021-02-05 DIAGNOSIS — F331 Major depressive disorder, recurrent, moderate: Secondary | ICD-10-CM | POA: Diagnosis not present

## 2021-02-06 DIAGNOSIS — F4320 Adjustment disorder, unspecified: Secondary | ICD-10-CM | POA: Diagnosis not present

## 2021-02-12 DIAGNOSIS — Z630 Problems in relationship with spouse or partner: Secondary | ICD-10-CM | POA: Diagnosis not present

## 2021-02-12 DIAGNOSIS — F331 Major depressive disorder, recurrent, moderate: Secondary | ICD-10-CM | POA: Diagnosis not present

## 2021-02-17 DIAGNOSIS — F4320 Adjustment disorder, unspecified: Secondary | ICD-10-CM | POA: Diagnosis not present

## 2021-02-19 DIAGNOSIS — F331 Major depressive disorder, recurrent, moderate: Secondary | ICD-10-CM | POA: Diagnosis not present

## 2021-02-19 DIAGNOSIS — Z630 Problems in relationship with spouse or partner: Secondary | ICD-10-CM | POA: Diagnosis not present

## 2021-02-22 DIAGNOSIS — F4320 Adjustment disorder, unspecified: Secondary | ICD-10-CM | POA: Diagnosis not present

## 2021-02-23 DIAGNOSIS — F4320 Adjustment disorder, unspecified: Secondary | ICD-10-CM | POA: Diagnosis not present

## 2021-02-24 DIAGNOSIS — F4320 Adjustment disorder, unspecified: Secondary | ICD-10-CM | POA: Diagnosis not present

## 2021-02-26 DIAGNOSIS — F331 Major depressive disorder, recurrent, moderate: Secondary | ICD-10-CM | POA: Diagnosis not present

## 2021-02-26 DIAGNOSIS — Z630 Problems in relationship with spouse or partner: Secondary | ICD-10-CM | POA: Diagnosis not present

## 2021-03-01 DIAGNOSIS — E559 Vitamin D deficiency, unspecified: Secondary | ICD-10-CM | POA: Diagnosis not present

## 2021-03-01 DIAGNOSIS — E7210 Disorders of sulfur-bearing amino-acid metabolism, unspecified: Secondary | ICD-10-CM | POA: Diagnosis not present

## 2021-03-01 DIAGNOSIS — N951 Menopausal and female climacteric states: Secondary | ICD-10-CM | POA: Diagnosis not present

## 2021-03-01 DIAGNOSIS — E611 Iron deficiency: Secondary | ICD-10-CM | POA: Diagnosis not present

## 2021-03-01 DIAGNOSIS — N920 Excessive and frequent menstruation with regular cycle: Secondary | ICD-10-CM | POA: Diagnosis not present

## 2021-03-05 DIAGNOSIS — Z630 Problems in relationship with spouse or partner: Secondary | ICD-10-CM | POA: Diagnosis not present

## 2021-03-05 DIAGNOSIS — F331 Major depressive disorder, recurrent, moderate: Secondary | ICD-10-CM | POA: Diagnosis not present

## 2021-03-09 DIAGNOSIS — F4320 Adjustment disorder, unspecified: Secondary | ICD-10-CM | POA: Diagnosis not present

## 2021-03-16 DIAGNOSIS — F4320 Adjustment disorder, unspecified: Secondary | ICD-10-CM | POA: Diagnosis not present

## 2021-03-18 DIAGNOSIS — Z01419 Encounter for gynecological examination (general) (routine) without abnormal findings: Secondary | ICD-10-CM | POA: Diagnosis not present

## 2021-03-19 DIAGNOSIS — Z630 Problems in relationship with spouse or partner: Secondary | ICD-10-CM | POA: Diagnosis not present

## 2021-03-19 DIAGNOSIS — F4320 Adjustment disorder, unspecified: Secondary | ICD-10-CM | POA: Diagnosis not present

## 2021-03-19 DIAGNOSIS — F331 Major depressive disorder, recurrent, moderate: Secondary | ICD-10-CM | POA: Diagnosis not present

## 2021-03-25 DIAGNOSIS — F4320 Adjustment disorder, unspecified: Secondary | ICD-10-CM | POA: Diagnosis not present

## 2021-03-27 DIAGNOSIS — F4320 Adjustment disorder, unspecified: Secondary | ICD-10-CM | POA: Diagnosis not present

## 2021-04-01 DIAGNOSIS — F4320 Adjustment disorder, unspecified: Secondary | ICD-10-CM | POA: Diagnosis not present

## 2021-04-02 DIAGNOSIS — Z630 Problems in relationship with spouse or partner: Secondary | ICD-10-CM | POA: Diagnosis not present

## 2021-04-02 DIAGNOSIS — F331 Major depressive disorder, recurrent, moderate: Secondary | ICD-10-CM | POA: Diagnosis not present

## 2021-04-10 DIAGNOSIS — F4320 Adjustment disorder, unspecified: Secondary | ICD-10-CM | POA: Diagnosis not present

## 2021-04-16 DIAGNOSIS — F331 Major depressive disorder, recurrent, moderate: Secondary | ICD-10-CM | POA: Diagnosis not present

## 2021-04-16 DIAGNOSIS — Z630 Problems in relationship with spouse or partner: Secondary | ICD-10-CM | POA: Diagnosis not present

## 2021-04-17 DIAGNOSIS — F4320 Adjustment disorder, unspecified: Secondary | ICD-10-CM | POA: Diagnosis not present

## 2021-04-25 DIAGNOSIS — F4320 Adjustment disorder, unspecified: Secondary | ICD-10-CM | POA: Diagnosis not present

## 2021-05-06 DIAGNOSIS — F4320 Adjustment disorder, unspecified: Secondary | ICD-10-CM | POA: Diagnosis not present

## 2021-05-07 DIAGNOSIS — Z630 Problems in relationship with spouse or partner: Secondary | ICD-10-CM | POA: Diagnosis not present

## 2021-05-07 DIAGNOSIS — F331 Major depressive disorder, recurrent, moderate: Secondary | ICD-10-CM | POA: Diagnosis not present

## 2021-05-14 DIAGNOSIS — F4320 Adjustment disorder, unspecified: Secondary | ICD-10-CM | POA: Diagnosis not present

## 2021-05-20 DIAGNOSIS — F4320 Adjustment disorder, unspecified: Secondary | ICD-10-CM | POA: Diagnosis not present

## 2021-05-21 DIAGNOSIS — F331 Major depressive disorder, recurrent, moderate: Secondary | ICD-10-CM | POA: Diagnosis not present

## 2021-05-21 DIAGNOSIS — Z630 Problems in relationship with spouse or partner: Secondary | ICD-10-CM | POA: Diagnosis not present

## 2021-05-25 DIAGNOSIS — F4320 Adjustment disorder, unspecified: Secondary | ICD-10-CM | POA: Diagnosis not present

## 2021-05-27 DIAGNOSIS — Z8249 Family history of ischemic heart disease and other diseases of the circulatory system: Secondary | ICD-10-CM | POA: Diagnosis not present

## 2021-05-27 DIAGNOSIS — R5383 Other fatigue: Secondary | ICD-10-CM | POA: Diagnosis not present

## 2021-05-27 DIAGNOSIS — K580 Irritable bowel syndrome with diarrhea: Secondary | ICD-10-CM | POA: Diagnosis not present

## 2021-05-27 DIAGNOSIS — N920 Excessive and frequent menstruation with regular cycle: Secondary | ICD-10-CM | POA: Diagnosis not present

## 2021-05-27 DIAGNOSIS — E639 Nutritional deficiency, unspecified: Secondary | ICD-10-CM | POA: Diagnosis not present

## 2021-05-27 DIAGNOSIS — E7210 Disorders of sulfur-bearing amino-acid metabolism, unspecified: Secondary | ICD-10-CM | POA: Diagnosis not present

## 2021-05-27 DIAGNOSIS — E559 Vitamin D deficiency, unspecified: Secondary | ICD-10-CM | POA: Diagnosis not present

## 2021-05-27 DIAGNOSIS — E611 Iron deficiency: Secondary | ICD-10-CM | POA: Diagnosis not present

## 2021-05-27 DIAGNOSIS — N951 Menopausal and female climacteric states: Secondary | ICD-10-CM | POA: Diagnosis not present

## 2021-05-28 DIAGNOSIS — Z630 Problems in relationship with spouse or partner: Secondary | ICD-10-CM | POA: Diagnosis not present

## 2021-05-28 DIAGNOSIS — F331 Major depressive disorder, recurrent, moderate: Secondary | ICD-10-CM | POA: Diagnosis not present

## 2021-06-04 DIAGNOSIS — F331 Major depressive disorder, recurrent, moderate: Secondary | ICD-10-CM | POA: Diagnosis not present

## 2021-06-04 DIAGNOSIS — Z630 Problems in relationship with spouse or partner: Secondary | ICD-10-CM | POA: Diagnosis not present

## 2021-06-17 DIAGNOSIS — F4320 Adjustment disorder, unspecified: Secondary | ICD-10-CM | POA: Diagnosis not present

## 2021-06-24 DIAGNOSIS — F4320 Adjustment disorder, unspecified: Secondary | ICD-10-CM | POA: Diagnosis not present

## 2021-07-01 DIAGNOSIS — F4320 Adjustment disorder, unspecified: Secondary | ICD-10-CM | POA: Diagnosis not present

## 2021-07-02 ENCOUNTER — Encounter: Payer: Self-pay | Admitting: Obstetrics

## 2021-07-08 DIAGNOSIS — F4320 Adjustment disorder, unspecified: Secondary | ICD-10-CM | POA: Diagnosis not present

## 2021-07-16 DIAGNOSIS — F331 Major depressive disorder, recurrent, moderate: Secondary | ICD-10-CM | POA: Diagnosis not present

## 2021-07-16 DIAGNOSIS — Z630 Problems in relationship with spouse or partner: Secondary | ICD-10-CM | POA: Diagnosis not present

## 2021-07-29 DIAGNOSIS — F4320 Adjustment disorder, unspecified: Secondary | ICD-10-CM | POA: Diagnosis not present

## 2021-07-30 DIAGNOSIS — Z630 Problems in relationship with spouse or partner: Secondary | ICD-10-CM | POA: Diagnosis not present

## 2021-07-30 DIAGNOSIS — F331 Major depressive disorder, recurrent, moderate: Secondary | ICD-10-CM | POA: Diagnosis not present

## 2021-07-31 DIAGNOSIS — F4320 Adjustment disorder, unspecified: Secondary | ICD-10-CM | POA: Diagnosis not present

## 2021-08-03 DIAGNOSIS — F4320 Adjustment disorder, unspecified: Secondary | ICD-10-CM | POA: Diagnosis not present

## 2021-08-05 DIAGNOSIS — F4320 Adjustment disorder, unspecified: Secondary | ICD-10-CM | POA: Diagnosis not present

## 2021-08-08 ENCOUNTER — Encounter (HOSPITAL_BASED_OUTPATIENT_CLINIC_OR_DEPARTMENT_OTHER): Payer: Self-pay | Admitting: Emergency Medicine

## 2021-08-08 ENCOUNTER — Emergency Department (HOSPITAL_BASED_OUTPATIENT_CLINIC_OR_DEPARTMENT_OTHER)
Admission: EM | Admit: 2021-08-08 | Discharge: 2021-08-08 | Disposition: A | Discharge status: Home / Self Care | Payer: Federal, State, Local not specified - PPO | Attending: Emergency Medicine | Admitting: Emergency Medicine

## 2021-08-08 ENCOUNTER — Other Ambulatory Visit: Payer: Self-pay

## 2021-08-08 ENCOUNTER — Emergency Department (HOSPITAL_BASED_OUTPATIENT_CLINIC_OR_DEPARTMENT_OTHER): Payer: Federal, State, Local not specified - PPO

## 2021-08-08 DIAGNOSIS — R109 Unspecified abdominal pain: Secondary | ICD-10-CM | POA: Diagnosis not present

## 2021-08-08 DIAGNOSIS — Z91040 Latex allergy status: Secondary | ICD-10-CM | POA: Insufficient documentation

## 2021-08-08 DIAGNOSIS — R1031 Right lower quadrant pain: Secondary | ICD-10-CM | POA: Diagnosis not present

## 2021-08-08 LAB — CBC WITH DIFFERENTIAL/PLATELET
Abs Immature Granulocytes: 0.02 10*3/uL (ref 0.00–0.07)
Basophils Absolute: 0 10*3/uL (ref 0.0–0.1)
Basophils Relative: 0 %
Eosinophils Absolute: 0.2 10*3/uL (ref 0.0–0.5)
Eosinophils Relative: 2 %
HCT: 36.2 % (ref 36.0–46.0)
Hemoglobin: 11.7 g/dL — ABNORMAL LOW (ref 12.0–15.0)
Immature Granulocytes: 0 %
Lymphocytes Relative: 13 %
Lymphs Abs: 1.1 10*3/uL (ref 0.7–4.0)
MCH: 30.5 pg (ref 26.0–34.0)
MCHC: 32.3 g/dL (ref 30.0–36.0)
MCV: 94.5 fL (ref 80.0–100.0)
Monocytes Absolute: 0.6 10*3/uL (ref 0.1–1.0)
Monocytes Relative: 7 %
Neutro Abs: 6.5 10*3/uL (ref 1.7–7.7)
Neutrophils Relative %: 78 %
Platelets: 156 10*3/uL (ref 150–400)
RBC: 3.83 MIL/uL — ABNORMAL LOW (ref 3.87–5.11)
RDW: 12.7 % (ref 11.5–15.5)
WBC: 8.3 10*3/uL (ref 4.0–10.5)
nRBC: 0 % (ref 0.0–0.2)

## 2021-08-08 LAB — COMPREHENSIVE METABOLIC PANEL
ALT: 13 U/L (ref 0–44)
AST: 15 U/L (ref 15–41)
Albumin: 4.8 g/dL (ref 3.5–5.0)
Alkaline Phosphatase: 31 U/L — ABNORMAL LOW (ref 38–126)
Anion gap: 10 (ref 5–15)
BUN: 16 mg/dL (ref 6–20)
CO2: 27 mmol/L (ref 22–32)
Calcium: 9.6 mg/dL (ref 8.9–10.3)
Chloride: 104 mmol/L (ref 98–111)
Creatinine, Ser: 0.62 mg/dL (ref 0.44–1.00)
GFR, Estimated: >60 mL/min (ref >60)
Glucose, Bld: 89 mg/dL (ref 70–99)
Potassium: 3.7 mmol/L (ref 3.5–5.1)
Sodium: 141 mmol/L (ref 135–145)
Total Bilirubin: 0.4 mg/dL (ref 0.3–1.2)
Total Protein: 7.1 g/dL (ref 6.5–8.1)

## 2021-08-08 LAB — URINALYSIS, ROUTINE W REFLEX MICROSCOPIC
Bilirubin Urine: NEGATIVE
Glucose, UA: NEGATIVE mg/dL
Hgb urine dipstick: NEGATIVE
Ketones, ur: NEGATIVE mg/dL
Leukocytes,Ua: NEGATIVE
Nitrite: NEGATIVE
Protein, ur: NEGATIVE mg/dL
Specific Gravity, Urine: 1.012 (ref 1.005–1.030)
pH: 7 (ref 5.0–8.0)

## 2021-08-08 LAB — PREGNANCY, URINE: Preg Test, Ur: NEGATIVE

## 2021-08-08 MED ORDER — KETOROLAC TROMETHAMINE 30 MG/ML IJ SOLN
30.0000 mg | Freq: Once | INTRAMUSCULAR | Status: AC
  Administered 2021-08-08: 30 mg via INTRAVENOUS
  Filled 2021-08-08: qty 1

## 2021-08-08 MED ORDER — LIDOCAINE 5 % EX PTCH: 1.0000 | MEDICATED_PATCH | CUTANEOUS | 0 refills | Status: AC

## 2021-08-08 MED ORDER — DICLOFENAC SODIUM ER 100 MG PO TB24: 100.0000 mg | ORAL_TABLET | Freq: Every day | ORAL | 0 refills | Status: AC

## 2021-08-08 MED ORDER — IOHEXOL 300 MG/ML  SOLN
75.0000 mL | Freq: Once | INTRAMUSCULAR | Status: AC | PRN
  Administered 2021-08-08: 75 mL via INTRAVENOUS

## 2021-08-08 NOTE — ED Provider Notes (Signed)
?[facility] EM[facility] DEPT ?Provider Note ? ? ?CSN: [id number] ?Arrival date & time: 08/08/21  0108 ? ?  ? ?History ? ?Chief Complaint  ?Patient presents with  ? Hip Pain  ? ? ?Sheena Jarvis is a 51 y.o. female. ? ?The history is provided by the patient.  ?Abdominal Pain ?Pain location:  RLQ ?Pain quality comment:  Deep pain ?Pain radiation: radiates to the right hip and low back. ?Pain severity:  Severe ?Onset quality:  Gradual ?Timing:  Constant ?Progression:  Worsening ?Chronicity:  Recurrent ?Context: not trauma   ?Relieved by:  Nothing ?Worsened by:  Nothing ?Ineffective treatments:  None tried ?Associated symptoms: no constipation, no dysuria, no fever and no vomiting   ?Risk factors: not pregnant   ?Patient presents with RLQ pain that radiates to the posterior hip.  It has been back today but patient states it is not musculoskeletal.  She states that it has been coming on for months.  Moreover, she has had some pain for years that she has been working through.  Patient states she thinks it may be related to endometriosis.  She denies nausea and vomiting and constipation.  No urinary symptoms.  No fevers.  Patient reports she eats a healthy, plant based diet.   ?  ?Past Medical History:  ?Diagnosis Date  ? Pneumonia   ? PONV (postoperative nausea and vomiting)   ? Vision abnormalities   ? ? ?Home Medications ?Prior to Admission medications   ?Medication Sig Start Date End Date Taking? Authorizing Provider  ?Ascorbic Acid (VITAMIN C) POWD Take 2,569 mg by mouth.    [provider]  ?ciclopirox (LOPROX) 0.77 % cream Apply topically 2 (two) times daily. 12/31/19   Demetrio Lapping, PA-C  ?hydrOXYzine (ATARAX/VISTARIL) 25 MG tablet Take 1 tablet (25 mg total) by mouth at bedtime as needed. 12/31/19   Demetrio Lapping, PA-C  ?Multiple Vitamin (MULTIVITAMIN) capsule Take 1 capsule by mouth daily.    Alver Fisher, RN  ?Omega-3 Fatty Acids (FISH OIL) 1000 MG CAPS Take by mouth.    Alver Fisher,  RN  ?ondansetron (ZOFRAN ODT) 4 MG disintegrating tablet 4mg  ODT q4 hours prn nausea/vomit 12/23/20   12/25/20, MD  ?predniSONE (DELTASONE) 10 MG tablet Take 1 tablet (10 mg total) by mouth daily with breakfast. Days 1-4 take 4 tablets (40 mg) daily  Days 5-8 take 3 tablets (30 mg) daily, Days 9-11 take 2 tablets (20 mg) daily, Days 12-14 take 1 tablet (10 mg) daily. 12/17/19   12/19/19, PA-C  ?TURMERIC PO Take by mouth.    [provider]  ?   ? ?Allergies    ?Penicillins, Tetracyclines & related, Latex, and Sulfa antibiotics   ? ?Review of Systems   ?Review of Systems  ?Constitutional:  Negative for fever.  ?HENT:  Negative for facial swelling.   ?Eyes:  Negative for redness.  ?Respiratory:  Negative for wheezing and stridor.   ?Gastrointestinal:  Positive for abdominal pain. Negative for constipation and vomiting.  ?Genitourinary:  Negative for dysuria.  ?Neurological:  Negative for facial asymmetry.  ?All other systems reviewed and are negative. ? ?Physical Exam ?Updated Vital Signs ?BP 104/64   Pulse 71   Temp 97.7 ?F (36.5 ?C)   Resp 16   SpO2 98%  ?Physical Exam ?Vitals and nursing note reviewed.  ?Constitutional:   ?   General: She is not in acute distress. ?   Appearance: Normal appearance.  ?HENT:  ?  Head: Normocephalic and atraumatic.  ?   Nose: Nose normal.  ?Eyes:  ?   Conjunctiva/sclera: Conjunctivae normal.  ?   Pupils: Pupils are equal, round, and reactive to light.  ?Cardiovascular:  ?   Rate and Rhythm: Normal rate and regular rhythm.  ?   Pulses: Normal pulses.  ?   Heart sounds: Normal heart sounds.  ?Pulmonary:  ?   Effort: Pulmonary effort is normal.  ?   Breath sounds: Normal breath sounds.  ?Abdominal:  ?   General: Abdomen is flat. Bowel sounds are normal.  ?   Palpations: Abdomen is soft.  ?   Tenderness: There is no abdominal tenderness. There is no guarding or rebound. Negative signs include Rovsing's sign and McBurney's sign.  ?   Hernia: No hernia is  present.  ?Musculoskeletal:  ?   Cervical back: Normal range of motion and neck supple.  ?   Right lower leg: No edema.  ?   Left lower leg: No edema.  ?Skin: ?   General: Skin is warm and dry.  ?   Capillary Refill: Capillary refill takes less than 2 seconds.  ?Neurological:  ?   General: No focal deficit present.  ?   Mental Status: She is alert and oriented to person, place, and time.  ?   Deep Tendon Reflexes: Reflexes normal.  ?Psychiatric:     ?   Mood and Affect: Mood normal.     ?   Behavior: Behavior normal.  ? ? ?ED Results / Procedures / Treatments   ?Labs ?(all labs ordered are listed, but only abnormal results are displayed) ?Results for orders placed or performed during the hospital encounter of 08/08/21  ?Urinalysis, Routine w reflex microscopic  ?Result Value Ref Range  ? Color, Urine YELLOW YELLOW  ? APPearance HAZY (A) CLEAR  ? Specific Gravity, Urine 1.012 1.005 - 1.030  ? pH 7.0 5.0 - 8.0  ? Glucose, UA NEGATIVE NEGATIVE mg/dL  ? Hgb urine dipstick NEGATIVE NEGATIVE  ? Bilirubin Urine NEGATIVE NEGATIVE  ? Ketones, ur NEGATIVE NEGATIVE mg/dL  ? Protein, ur NEGATIVE NEGATIVE mg/dL  ? Nitrite NEGATIVE NEGATIVE  ? Leukocytes,Ua NEGATIVE NEGATIVE  ? RBC / HPF 0-5 0 - 5 RBC/hpf  ? WBC, UA 0-5 0 - 5 WBC/hpf  ? Squamous Epithelial / LPF 0-5 0 - 5  ? Mucus PRESENT   ?Pregnancy, urine  ?Result Value Ref Range  ? Preg Test, Ur NEGATIVE NEGATIVE  ?CBC with Differential/Platelet  ?Result Value Ref Range  ? WBC 8.3 4.0 - 10.5 K/uL  ? RBC 3.83 (L) 3.87 - 5.11 MIL/uL  ? Hemoglobin 11.7 (L) 12.0 - 15.0 g/dL  ? HCT 36.2 36.0 - 46.0 %  ? MCV 94.5 80.0 - 100.0 fL  ? MCH 30.5 26.0 - 34.0 pg  ? MCHC 32.3 30.0 - 36.0 g/dL  ? RDW 12.7 11.5 - 15.5 %  ? Platelets 156 150 - 400 K/uL  ? nRBC 0.0 0.0 - 0.2 %  ? Neutrophils Relative % 78 %  ? Neutro Abs 6.5 1.7 - 7.7 K/uL  ? Lymphocytes Relative 13 %  ? Lymphs Abs 1.1 0.7 - 4.0 K/uL  ? Monocytes Relative 7 %  ? Monocytes Absolute 0.6 0.1 - 1.0 K/uL  ? Eosinophils Relative 2 %   ? Eosinophils Absolute 0.2 0.0 - 0.5 K/uL  ? Basophils Relative 0 %  ? Basophils Absolute 0.0 0.0 - 0.1 K/uL  ? Immature Granulocytes 0 %  ? Abs  Immature Granulocytes 0.02 0.00 - 0.07 K/uL  ?Comprehensive metabolic panel  ?Result Value Ref Range  ? Sodium 141 135 - 145 mmol/L  ? Potassium 3.7 3.5 - 5.1 mmol/L  ? Chloride 104 98 - 111 mmol/L  ? CO2 27 22 - 32 mmol/L  ? Glucose, Bld 89 70 - 99 mg/dL  ? BUN 16 6 - 20 mg/dL  ? Creatinine, Ser 0.62 0.44 - 1.00 mg/dL  ? Calcium 9.6 8.9 - 10.3 mg/dL  ? Total Protein 7.1 6.5 - 8.1 g/dL  ? Albumin 4.8 3.5 - 5.0 g/dL  ? AST 15 15 - 41 U/L  ? ALT 13 0 - 44 U/L  ? Alkaline Phosphatase 31 (L) 38 - 126 U/L  ? Total Bilirubin 0.4 0.3 - 1.2 mg/dL  ? GFR, Estimated >60 >60 mL/min  ? Anion gap 10 5 - 15  ? ?CT ABDOMEN PELVIS W CONTRAST ? ?Result Date: 08/08/2021 ?CLINICAL DATA:  Acute, nonlocalized abdominal pain EXAM: CT ABDOMEN AND PELVIS WITH CONTRAST TECHNIQUE: Multidetector CT imaging of the abdomen and pelvis was performed using the standard protocol following bolus administration of intravenous contrast. RADIATION DOSE REDUCTION: This exam was performed according to the departmental dose-optimization program which includes automated exposure control, adjustment of the mA and/or kV according to patient size and/or use of iterative reconstruction technique. CONTRAST:  104mL OMNIPAQUE IOHEXOL 300 MG/ML  SOLN COMPARISON:  None. FINDINGS: Lower chest:  No contributory findings. Hepatobiliary: Scattered cystic densities in the liver, up to 2.3 cm in the lower right lobe.Cholecystectomy. No bile duct dilatation Pancreas: Unremarkable. Spleen: Unremarkable. Adrenals/Urinary Tract: Negative adrenals. No hydronephrosis or stone. Unremarkable bladder. Stomach/Bowel: No obstruction. Appendectomy. No visible bowel inflammation. Vascular/Lymphatic: No acute vascular abnormality. Retroaortic left renal vein with larger left gonadal veins. No mass or adenopathy. Reproductive:No pathologic  findings. Other: No ascites or pneumoperitoneum. Musculoskeletal: No acute abnormalities. IMPRESSION: 1. No acute finding. 2. Cholecystectomy and appendectomy. Electronically Signed   By: Tiburcio Pea M.D.   On:

## 2021-08-08 NOTE — ED Triage Notes (Signed)
Pt presents for hip pain that radiates into her posterior hip, has been progressively worse such that she now notes reduced ROM in R hip today 2 hrs ago. When describing pain also points to RLQ ?H/o abd adhesion surgery after gall bladder removal.  ?Pain worse with palpation.  ?Similar episode last year, was not treated at that time ?

## 2021-08-12 DIAGNOSIS — F4320 Adjustment disorder, unspecified: Secondary | ICD-10-CM | POA: Diagnosis not present

## 2021-08-13 ENCOUNTER — Other Ambulatory Visit: Payer: Self-pay | Admitting: Emergency Medicine

## 2021-08-13 DIAGNOSIS — Z13820 Encounter for screening for osteoporosis: Secondary | ICD-10-CM

## 2021-08-17 DIAGNOSIS — F4320 Adjustment disorder, unspecified: Secondary | ICD-10-CM | POA: Diagnosis not present

## 2021-08-19 DIAGNOSIS — F4320 Adjustment disorder, unspecified: Secondary | ICD-10-CM | POA: Diagnosis not present

## 2021-08-26 DIAGNOSIS — F4320 Adjustment disorder, unspecified: Secondary | ICD-10-CM | POA: Diagnosis not present

## 2021-08-28 DIAGNOSIS — F4320 Adjustment disorder, unspecified: Secondary | ICD-10-CM | POA: Diagnosis not present

## 2021-09-02 ENCOUNTER — Other Ambulatory Visit: Payer: Self-pay | Admitting: General Surgery

## 2021-09-02 DIAGNOSIS — Z1231 Encounter for screening mammogram for malignant neoplasm of breast: Secondary | ICD-10-CM

## 2021-09-09 DIAGNOSIS — F4320 Adjustment disorder, unspecified: Secondary | ICD-10-CM | POA: Diagnosis not present

## 2021-09-16 DIAGNOSIS — F4320 Adjustment disorder, unspecified: Secondary | ICD-10-CM | POA: Diagnosis not present

## 2021-09-17 DIAGNOSIS — Z630 Problems in relationship with spouse or partner: Secondary | ICD-10-CM | POA: Diagnosis not present

## 2021-09-17 DIAGNOSIS — F331 Major depressive disorder, recurrent, moderate: Secondary | ICD-10-CM | POA: Diagnosis not present

## 2021-09-23 DIAGNOSIS — F4320 Adjustment disorder, unspecified: Secondary | ICD-10-CM | POA: Diagnosis not present

## 2021-09-28 DIAGNOSIS — F4320 Adjustment disorder, unspecified: Secondary | ICD-10-CM | POA: Diagnosis not present

## 2021-09-30 DIAGNOSIS — F4320 Adjustment disorder, unspecified: Secondary | ICD-10-CM | POA: Diagnosis not present

## 2021-10-05 DIAGNOSIS — K080 Exfoliation of teeth due to systemic causes: Secondary | ICD-10-CM | POA: Diagnosis not present

## 2021-10-07 DIAGNOSIS — F4320 Adjustment disorder, unspecified: Secondary | ICD-10-CM | POA: Diagnosis not present

## 2021-10-12 DIAGNOSIS — F4320 Adjustment disorder, unspecified: Secondary | ICD-10-CM | POA: Diagnosis not present

## 2021-11-18 DIAGNOSIS — F4320 Adjustment disorder, unspecified: Secondary | ICD-10-CM | POA: Diagnosis not present

## 2021-11-19 ENCOUNTER — Other Ambulatory Visit: Payer: Federal, State, Local not specified - PPO

## 2021-11-19 ENCOUNTER — Inpatient Hospital Stay: Admission: RE | Admit: 2021-11-19 | Payer: Federal, State, Local not specified - PPO | Source: Ambulatory Visit

## 2021-11-23 DIAGNOSIS — F4320 Adjustment disorder, unspecified: Secondary | ICD-10-CM | POA: Diagnosis not present

## 2021-11-26 DIAGNOSIS — E639 Nutritional deficiency, unspecified: Secondary | ICD-10-CM | POA: Diagnosis not present

## 2021-11-26 DIAGNOSIS — E611 Iron deficiency: Secondary | ICD-10-CM | POA: Diagnosis not present

## 2021-11-26 DIAGNOSIS — F4320 Adjustment disorder, unspecified: Secondary | ICD-10-CM | POA: Diagnosis not present

## 2021-11-26 DIAGNOSIS — N920 Excessive and frequent menstruation with regular cycle: Secondary | ICD-10-CM | POA: Diagnosis not present

## 2021-11-26 DIAGNOSIS — Z8249 Family history of ischemic heart disease and other diseases of the circulatory system: Secondary | ICD-10-CM | POA: Diagnosis not present

## 2021-11-26 DIAGNOSIS — E559 Vitamin D deficiency, unspecified: Secondary | ICD-10-CM | POA: Diagnosis not present

## 2021-11-26 DIAGNOSIS — E7210 Disorders of sulfur-bearing amino-acid metabolism, unspecified: Secondary | ICD-10-CM | POA: Diagnosis not present

## 2021-11-26 DIAGNOSIS — R5383 Other fatigue: Secondary | ICD-10-CM | POA: Diagnosis not present

## 2021-11-26 DIAGNOSIS — K580 Irritable bowel syndrome with diarrhea: Secondary | ICD-10-CM | POA: Diagnosis not present

## 2021-11-29 DIAGNOSIS — N809 Endometriosis, unspecified: Secondary | ICD-10-CM | POA: Diagnosis not present

## 2021-11-29 DIAGNOSIS — R7989 Other specified abnormal findings of blood chemistry: Secondary | ICD-10-CM | POA: Diagnosis not present

## 2021-11-29 DIAGNOSIS — Z8742 Personal history of other diseases of the female genital tract: Secondary | ICD-10-CM | POA: Diagnosis not present

## 2021-11-29 DIAGNOSIS — Z8639 Personal history of other endocrine, nutritional and metabolic disease: Secondary | ICD-10-CM | POA: Diagnosis not present

## 2021-12-02 DIAGNOSIS — F4320 Adjustment disorder, unspecified: Secondary | ICD-10-CM | POA: Diagnosis not present

## 2021-12-04 DIAGNOSIS — F4320 Adjustment disorder, unspecified: Secondary | ICD-10-CM | POA: Diagnosis not present

## 2021-12-10 DIAGNOSIS — F4320 Adjustment disorder, unspecified: Secondary | ICD-10-CM | POA: Diagnosis not present

## 2021-12-17 ENCOUNTER — Ambulatory Visit
Admission: RE | Admit: 2021-12-17 | Discharge: 2021-12-17 | Disposition: A | Discharge status: Home / Self Care | Payer: Federal, State, Local not specified - PPO | Source: Ambulatory Visit | Attending: Emergency Medicine | Admitting: Emergency Medicine

## 2021-12-17 ENCOUNTER — Ambulatory Visit
Admission: RE | Admit: 2021-12-17 | Discharge: 2021-12-17 | Disposition: A | Discharge status: Home / Self Care | Payer: Federal, State, Local not specified - PPO | Source: Ambulatory Visit | Attending: General Surgery | Admitting: General Surgery

## 2021-12-17 DIAGNOSIS — M85852 Other specified disorders of bone density and structure, left thigh: Secondary | ICD-10-CM | POA: Diagnosis not present

## 2021-12-17 DIAGNOSIS — Z1231 Encounter for screening mammogram for malignant neoplasm of breast: Secondary | ICD-10-CM | POA: Diagnosis not present

## 2021-12-17 DIAGNOSIS — Z780 Asymptomatic menopausal state: Secondary | ICD-10-CM | POA: Diagnosis not present

## 2021-12-17 DIAGNOSIS — Z13820 Encounter for screening for osteoporosis: Secondary | ICD-10-CM

## 2021-12-19 ENCOUNTER — Other Ambulatory Visit: Payer: Self-pay | Admitting: Emergency Medicine

## 2021-12-20 ENCOUNTER — Other Ambulatory Visit: Payer: Self-pay | Admitting: Emergency Medicine

## 2021-12-20 DIAGNOSIS — E079 Disorder of thyroid, unspecified: Secondary | ICD-10-CM

## 2021-12-23 DIAGNOSIS — F4320 Adjustment disorder, unspecified: Secondary | ICD-10-CM | POA: Diagnosis not present

## 2021-12-25 DIAGNOSIS — F4320 Adjustment disorder, unspecified: Secondary | ICD-10-CM | POA: Diagnosis not present

## 2021-12-30 DIAGNOSIS — F4320 Adjustment disorder, unspecified: Secondary | ICD-10-CM | POA: Diagnosis not present

## 2022-01-06 DIAGNOSIS — F4320 Adjustment disorder, unspecified: Secondary | ICD-10-CM | POA: Diagnosis not present

## 2022-01-08 DIAGNOSIS — F4320 Adjustment disorder, unspecified: Secondary | ICD-10-CM | POA: Diagnosis not present

## 2022-01-14 ENCOUNTER — Ambulatory Visit
Admission: RE | Admit: 2022-01-14 | Discharge: 2022-01-14 | Disposition: A | Discharge status: Home / Self Care | Payer: Federal, State, Local not specified - PPO | Source: Ambulatory Visit | Attending: Emergency Medicine | Admitting: Emergency Medicine

## 2022-01-14 DIAGNOSIS — E079 Disorder of thyroid, unspecified: Secondary | ICD-10-CM

## 2022-01-14 DIAGNOSIS — E039 Hypothyroidism, unspecified: Secondary | ICD-10-CM | POA: Diagnosis not present

## 2022-01-18 DIAGNOSIS — F4320 Adjustment disorder, unspecified: Secondary | ICD-10-CM | POA: Diagnosis not present

## 2022-01-20 DIAGNOSIS — F4320 Adjustment disorder, unspecified: Secondary | ICD-10-CM | POA: Diagnosis not present

## 2022-02-01 DIAGNOSIS — F4320 Adjustment disorder, unspecified: Secondary | ICD-10-CM | POA: Diagnosis not present

## 2022-02-04 DIAGNOSIS — F331 Major depressive disorder, recurrent, moderate: Secondary | ICD-10-CM | POA: Diagnosis not present

## 2022-02-04 DIAGNOSIS — Z630 Problems in relationship with spouse or partner: Secondary | ICD-10-CM | POA: Diagnosis not present

## 2022-02-06 DIAGNOSIS — F4320 Adjustment disorder, unspecified: Secondary | ICD-10-CM | POA: Diagnosis not present

## 2022-02-10 DIAGNOSIS — F4320 Adjustment disorder, unspecified: Secondary | ICD-10-CM | POA: Diagnosis not present

## 2022-02-17 DIAGNOSIS — F4320 Adjustment disorder, unspecified: Secondary | ICD-10-CM | POA: Diagnosis not present

## 2022-02-24 DIAGNOSIS — F4320 Adjustment disorder, unspecified: Secondary | ICD-10-CM | POA: Diagnosis not present

## 2022-03-01 DIAGNOSIS — F4320 Adjustment disorder, unspecified: Secondary | ICD-10-CM | POA: Diagnosis not present

## 2022-03-10 DIAGNOSIS — F4320 Adjustment disorder, unspecified: Secondary | ICD-10-CM | POA: Diagnosis not present

## 2022-03-15 DIAGNOSIS — F4320 Adjustment disorder, unspecified: Secondary | ICD-10-CM | POA: Diagnosis not present

## 2022-03-17 DIAGNOSIS — F4320 Adjustment disorder, unspecified: Secondary | ICD-10-CM | POA: Diagnosis not present

## 2022-03-25 DIAGNOSIS — F4320 Adjustment disorder, unspecified: Secondary | ICD-10-CM | POA: Diagnosis not present

## 2022-03-31 DIAGNOSIS — F4320 Adjustment disorder, unspecified: Secondary | ICD-10-CM | POA: Diagnosis not present

## 2022-04-05 DIAGNOSIS — F4320 Adjustment disorder, unspecified: Secondary | ICD-10-CM | POA: Diagnosis not present

## 2022-04-14 DIAGNOSIS — F4320 Adjustment disorder, unspecified: Secondary | ICD-10-CM | POA: Diagnosis not present

## 2022-04-21 DIAGNOSIS — F4320 Adjustment disorder, unspecified: Secondary | ICD-10-CM | POA: Diagnosis not present

## 2022-04-25 IMAGING — CT CT ABD-PELV W/ CM
2 of 5 series · 16 of 46 positions shown, 18 images · IV contrast (APPLIED)
Comparison: None.

CLINICAL DATA: Acute, nonlocalized abdominal pain

EXAM:
CT ABDOMEN AND PELVIS WITH CONTRAST
TECHNIQUE: Multidetector CT imaging of the abdomen and pelvis was performed
using the standard protocol following bolus administration of
intravenous contrast.

[Series 2: abd pel w · axial · 0.67mm/px · z∈[+711,+1121]mm · 13 of 94 slices shown, 15 images]
[im 6/94  soft-tissue]
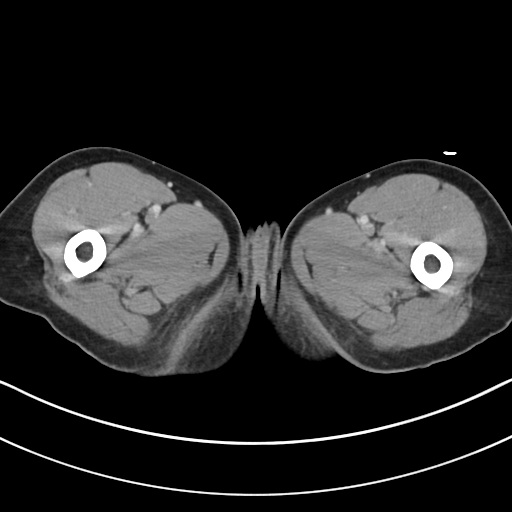
[im 6/94  bone]
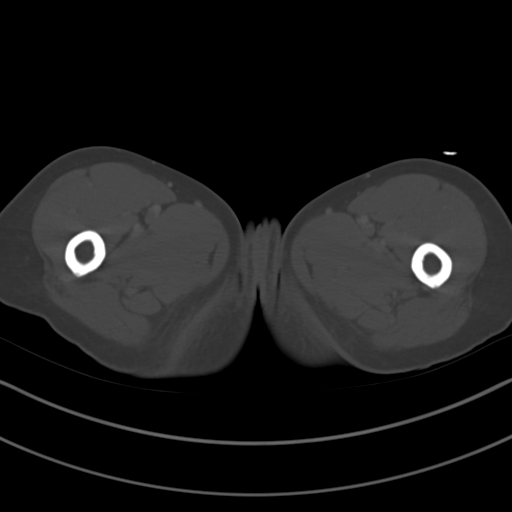
[im 11/94  soft-tissue]
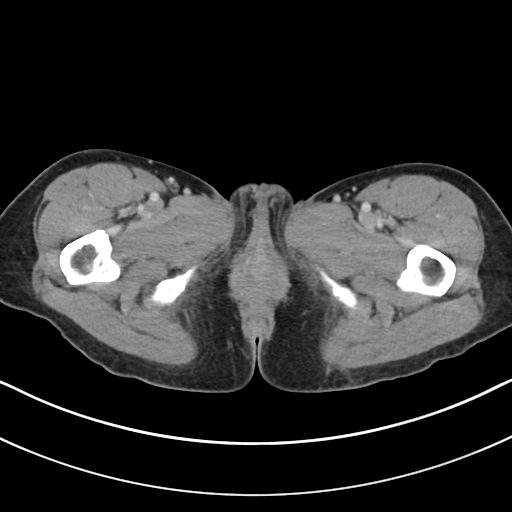
[im 21/94  soft-tissue]
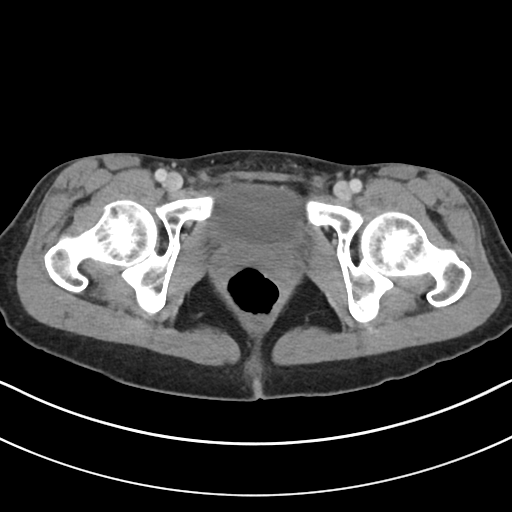
[im 26/94  soft-tissue]
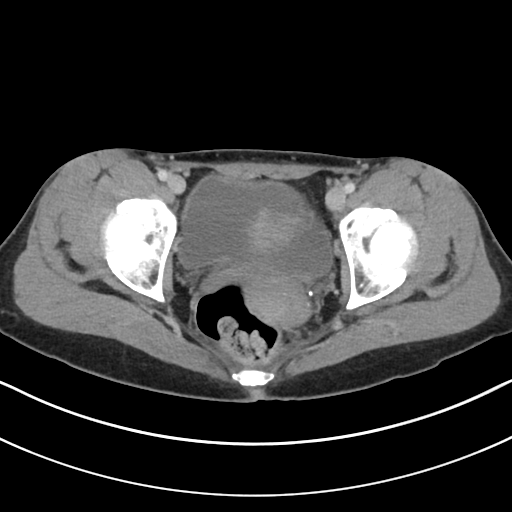
[im 32/94  soft-tissue]
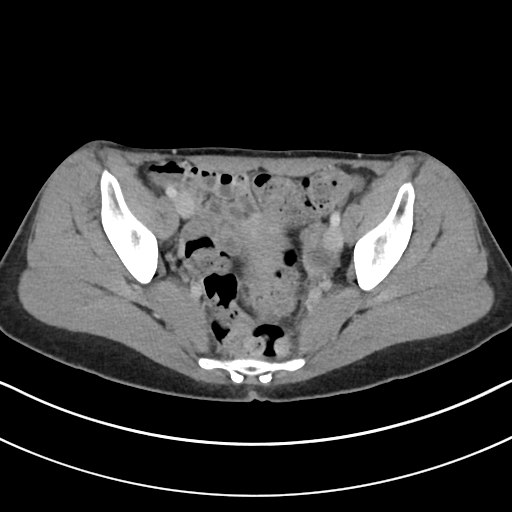
[im 42/94  soft-tissue]
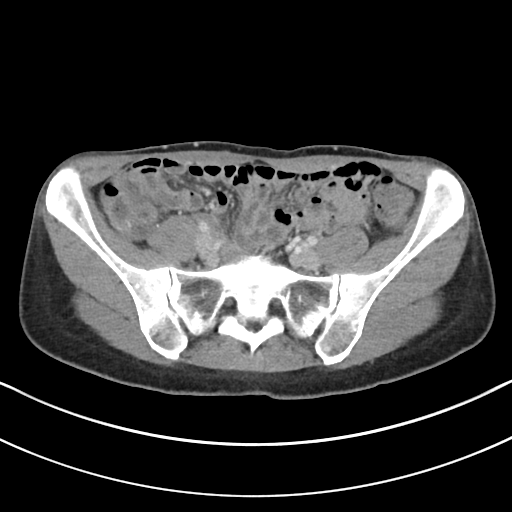
[im 47/94  soft-tissue]
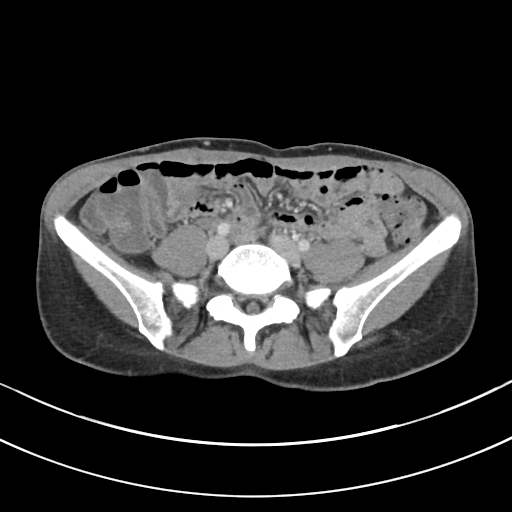
[im 52/94  soft-tissue]
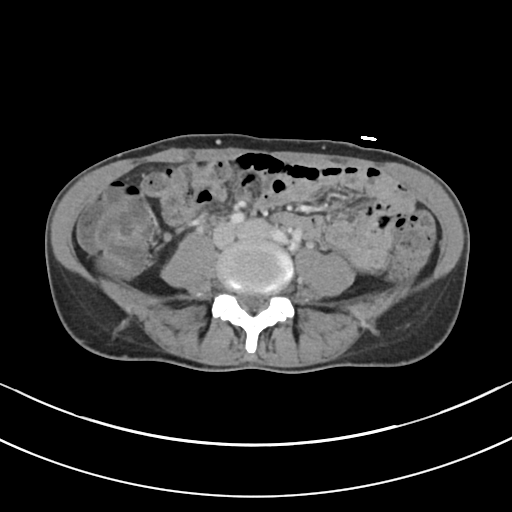
[im 63/94  soft-tissue]
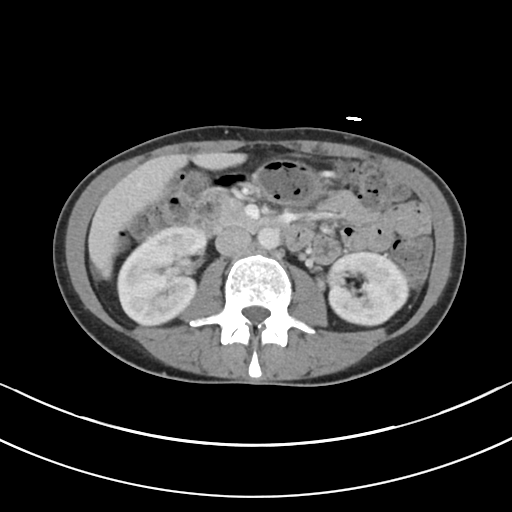
[im 63/94  bone]
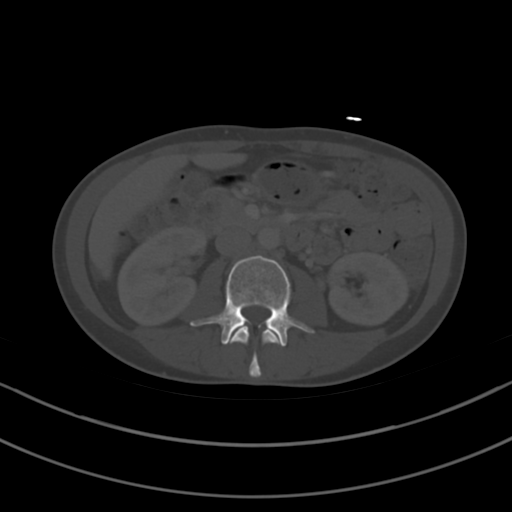
[im 68/94  soft-tissue]
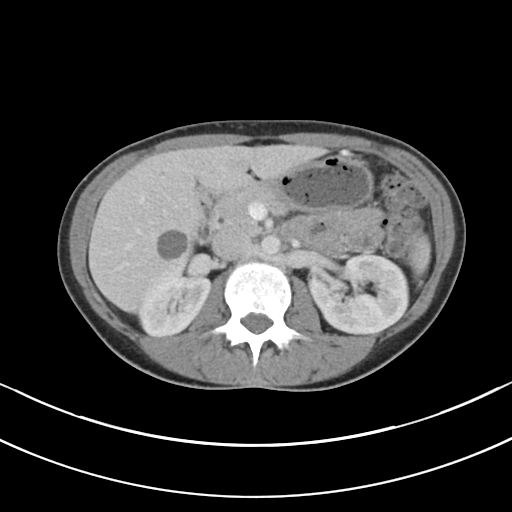
[im 73/94  soft-tissue]
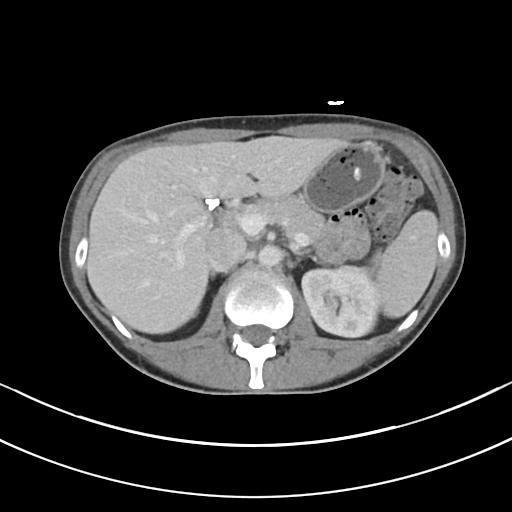
[im 83/94  soft-tissue]
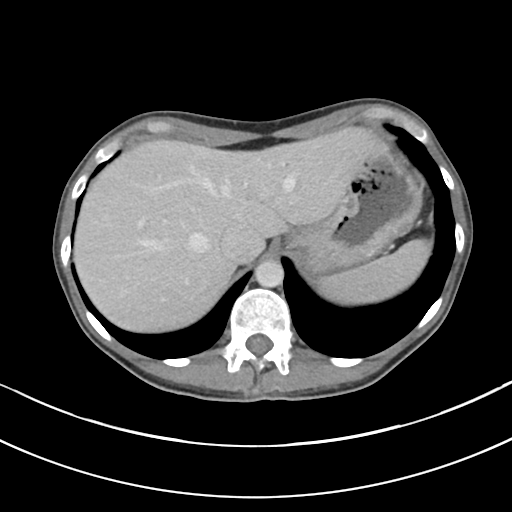
[im 88/94  soft-tissue]
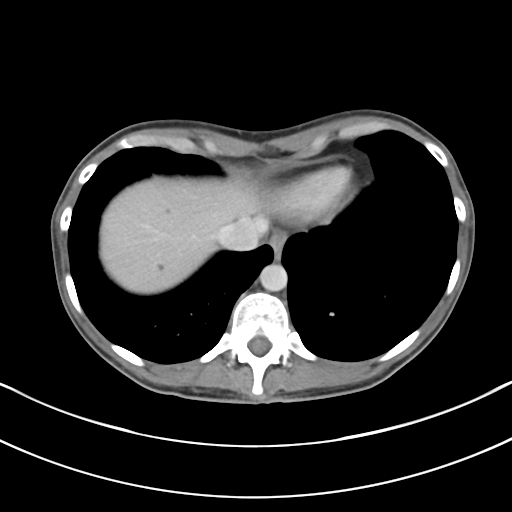

[Series 5: coronal · coronal · 0.74mm/px · 3 of 67 slices shown]
[im 23/67  soft-tissue]
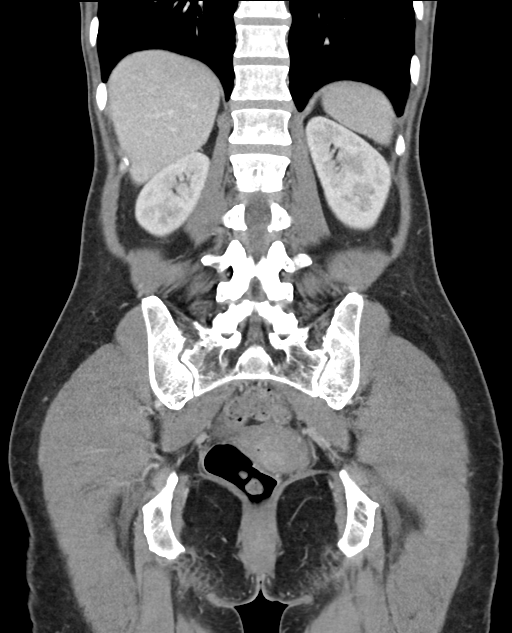
[im 30/67  soft-tissue]
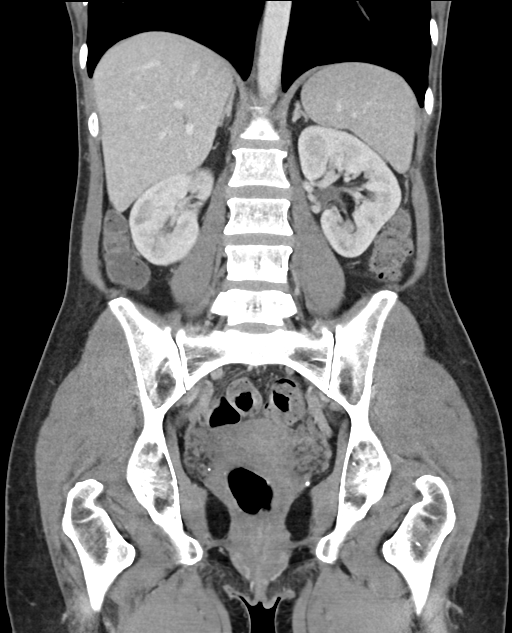
[im 37/67  soft-tissue]
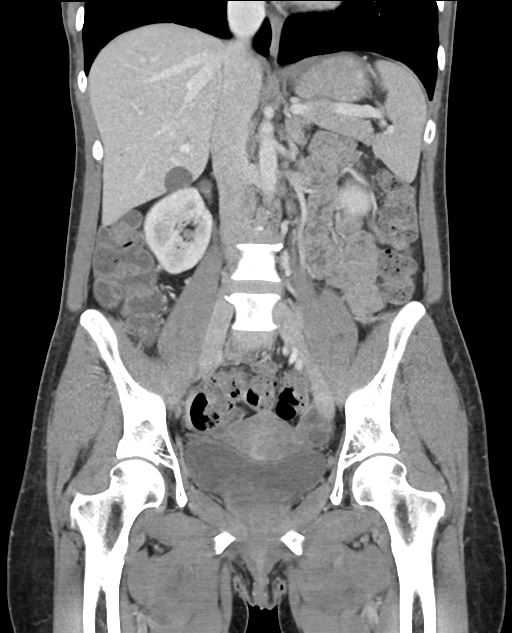

[16 of 46 positions shown; findings below may reference images not displayed]

RADIATION DOSE REDUCTION: This exam was performed according to the
departmental dose-optimization program which includes automated
exposure control, adjustment of the mA and/or kV according to
patient size and/or use of iterative reconstruction technique.

CONTRAST:  75mL OMNIPAQUE IOHEXOL 300 MG/ML  SOLN
FINDINGS: Lower chest:  No contributory findings.

Hepatobiliary: Scattered cystic densities in the liver, up to 2.3 cm
in the lower right lobe.Cholecystectomy. No bile duct dilatation

Pancreas: Unremarkable.

Spleen: Unremarkable.

Adrenals/Urinary Tract: Negative adrenals. No hydronephrosis or
stone. Unremarkable bladder.

Stomach/Bowel: No obstruction. Appendectomy. No visible bowel
inflammation.

Vascular/Lymphatic: No acute vascular abnormality. Retroaortic left
renal vein with larger left gonadal veins. No mass or adenopathy.

Reproductive:No pathologic findings.

Other: No ascites or pneumoperitoneum.

Musculoskeletal: No acute abnormalities.
IMPRESSION: 1. No acute finding.
2. Cholecystectomy and appendectomy.

## 2022-04-26 DIAGNOSIS — F4320 Adjustment disorder, unspecified: Secondary | ICD-10-CM | POA: Diagnosis not present

## 2022-05-12 DIAGNOSIS — F4320 Adjustment disorder, unspecified: Secondary | ICD-10-CM | POA: Diagnosis not present

## 2022-05-16 DIAGNOSIS — N920 Excessive and frequent menstruation with regular cycle: Secondary | ICD-10-CM | POA: Diagnosis not present

## 2022-05-16 DIAGNOSIS — E559 Vitamin D deficiency, unspecified: Secondary | ICD-10-CM | POA: Diagnosis not present

## 2022-05-16 DIAGNOSIS — N951 Menopausal and female climacteric states: Secondary | ICD-10-CM | POA: Diagnosis not present

## 2022-05-16 DIAGNOSIS — K580 Irritable bowel syndrome with diarrhea: Secondary | ICD-10-CM | POA: Diagnosis not present

## 2022-05-16 DIAGNOSIS — E611 Iron deficiency: Secondary | ICD-10-CM | POA: Diagnosis not present

## 2022-05-16 DIAGNOSIS — R5383 Other fatigue: Secondary | ICD-10-CM | POA: Diagnosis not present

## 2022-05-16 DIAGNOSIS — Z8249 Family history of ischemic heart disease and other diseases of the circulatory system: Secondary | ICD-10-CM | POA: Diagnosis not present

## 2022-05-16 DIAGNOSIS — E639 Nutritional deficiency, unspecified: Secondary | ICD-10-CM | POA: Diagnosis not present

## 2022-05-19 DIAGNOSIS — F4320 Adjustment disorder, unspecified: Secondary | ICD-10-CM | POA: Diagnosis not present

## 2022-05-21 DIAGNOSIS — F4320 Adjustment disorder, unspecified: Secondary | ICD-10-CM | POA: Diagnosis not present

## 2022-05-26 DIAGNOSIS — F4320 Adjustment disorder, unspecified: Secondary | ICD-10-CM | POA: Diagnosis not present

## 2022-05-29 DIAGNOSIS — F4320 Adjustment disorder, unspecified: Secondary | ICD-10-CM | POA: Diagnosis not present

## 2022-06-02 DIAGNOSIS — F4320 Adjustment disorder, unspecified: Secondary | ICD-10-CM | POA: Diagnosis not present

## 2022-06-03 DIAGNOSIS — D1801 Hemangioma of skin and subcutaneous tissue: Secondary | ICD-10-CM | POA: Diagnosis not present

## 2022-06-03 DIAGNOSIS — L738 Other specified follicular disorders: Secondary | ICD-10-CM | POA: Diagnosis not present

## 2022-06-03 DIAGNOSIS — D225 Melanocytic nevi of trunk: Secondary | ICD-10-CM | POA: Diagnosis not present

## 2022-06-03 DIAGNOSIS — D2271 Melanocytic nevi of right lower limb, including hip: Secondary | ICD-10-CM | POA: Diagnosis not present

## 2022-06-09 DIAGNOSIS — F4320 Adjustment disorder, unspecified: Secondary | ICD-10-CM | POA: Diagnosis not present

## 2022-06-12 DIAGNOSIS — F4320 Adjustment disorder, unspecified: Secondary | ICD-10-CM | POA: Diagnosis not present

## 2022-06-16 DIAGNOSIS — F4320 Adjustment disorder, unspecified: Secondary | ICD-10-CM | POA: Diagnosis not present

## 2022-06-23 DIAGNOSIS — F4320 Adjustment disorder, unspecified: Secondary | ICD-10-CM | POA: Diagnosis not present

## 2022-06-26 DIAGNOSIS — F4320 Adjustment disorder, unspecified: Secondary | ICD-10-CM | POA: Diagnosis not present

## 2022-06-30 DIAGNOSIS — F4320 Adjustment disorder, unspecified: Secondary | ICD-10-CM | POA: Diagnosis not present

## 2022-07-05 DIAGNOSIS — F4320 Adjustment disorder, unspecified: Secondary | ICD-10-CM | POA: Diagnosis not present

## 2022-07-21 DIAGNOSIS — F4320 Adjustment disorder, unspecified: Secondary | ICD-10-CM | POA: Diagnosis not present

## 2022-07-22 NOTE — Therapy (Signed)
OUTPATIENT PHYSICAL THERAPY ONCOLOGY EVALUATION  Patient Name: Sheena Jarvis MRN: [id number] DOB:[birth year], 52 y.o., female Today's Date: 07/23/2022  END OF SESSION:  PT End of Session - 07/23/22 0957     Visit Number 1    Number of Visits 12    Date for PT Re-Evaluation 09/03/22    PT Start Time 0905    PT Stop Time 0953    PT Time Calculation (min) 48 min    Activity Tolerance Patient tolerated treatment well    Behavior During Therapy [facility] for tasks assessed/performed             Past Medical History:  Diagnosis Date   Pneumonia    PONV (postoperative nausea and vomiting)    Vision abnormalities    Past Surgical History:  Procedure Laterality Date   APPENDECTOMY     BREAST BIOPSY Left 01/12/2019   [place]   BREAST BIOPSY Left 12/21/2018   benign   BREAST CYST ASPIRATION Left    BREAST EXCISIONAL BIOPSY Left 06/22/2019   Axilla BX - Benign   BREAST EXCISIONAL BIOPSY Left 02/10/2019   [place]   HIP ARTHROSCOPY     MASS EXCISION Left 02/10/2019   Procedure: EXCISION OF LEFT BREAST MASS;  Surgeon: Stark Klein, MD;  Location: [place];  Service: General;  Laterality: Left;   SCAR REVISION Left 06/22/2019   Procedure: LEFT AXILLARY CONTRACTURE RELEASE;  Surgeon: Stark Klein, MD;  Location: [place];  Service: General;  Laterality: Left;   Patient Active Problem List   Diagnosis Date Noted   Numbness 02/04/2016   Cognitive changes 02/04/2016   Other fatigue 02/04/2016     REFERRING PROVIDER: Shawnie Pons MD  REFERRING DIAG: Lymphedema post Endometrial surgery  THERAPY DIAG:  Lymphedema, not elsewhere classified  ONSET DATE: Mar 04, 2022  Rationale for Evaluation and Treatment: Rehabilitation  SUBJECTIVE:                                                                                                                                                                                           SUBJECTIVE STATEMENT: Pt had  Stage II Endometriosis  and hysterectomy with surgery performed on Mar 05, 2023. She noticed after surgery generalized swelling in the abdomen. She has 3 incisions. She has had numerous surgeries already. She has started to get some pitting edema in the abdominal region. Clothing at the end of the day is uncomfortable. She has a Flexi touch and that does help her. She uses it as much as she can. She also has a compression sleeve that she hasn't worn for  awhile. But  the arm has been achy. She has tried a binder and a swell spot, but she feels like it pushes the swelling down. She had some swelling in her arm and her left breast.  Her arm wakes her up at night with aching. 2 weeks ago with flight to [facility] breast got swollen and her arm is achy.  PERTINENT HISTORY:  Pt had recent surgery for Stage II Endometriosis on Oct 31,2024. She had a prior history of removal of benign breast mass on [id number] with complication of seroma and clogged drain and possible infection. She also developed lymphedema in her left breast and axillary cording. She receieved extensive PT with MLD, compression, manual therapy and dry needling, but had a significant persistent cord that was painful and limited shoulder ROM She underwent and incision of that contracted scar tissue on 06/22/2019 that included one node and develped a seroma after that.  PAIN:  Are you having pain? Yes NPRS scale: 3/10,left arm and breast, 5/10 abdominal region at night Pain location: Left arm and breast,  Pain orientation: Left  PAIN TYPE: aching, sharp, tight, and tingling Pain description: constant  Aggravating factors: Later in the day, sleeping, wearing regular pants Relieving factors:   PRECAUTIONS: Left UE lymphedema risk, abdominal swelling from Endometrial surgery/hysterectomy 03/05/2023  WEIGHT BEARING RESTRICTIONS: No  FALLS:  Has patient fallen in last 6 months? 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, integrative lifestyle medicine, pelvic floor, CE  LEISURE: yoga, anything outside, reading  HAND DOMINANCE: left   PRIOR LEVEL OF FUNCTION: Independent  PATIENT GOALS: differential dx, abdominal compression,cording release,   OBJECTIVE:  COGNITION: Overall cognitive status: Within functional limits for tasks assessed   PALPATION: Cording noted left axillary region running to incision, and upper arm  OBSERVATIONS / OTHER ASSESSMENTS: generalized left breast swelling;no enlarged pores or fibrotic areas noted, limitations in left shoulder ROM and cording noted left axillary region into upper arm. Release techniques produce tingling in median nerve distribution of hand. Decreased left scapular mobility. No visible abdominal swelling this am, but pt notices fullness that becomes pitting later in the day in the central abdomen. No rebound tenderness today noted in any bilateral upper or lower quarters of abdomen. 3 well healed abdominal incisions.  SENSATION: Light touch: Appears intact    POSTURE: WNL  UPPER EXTREMITY AROM/PROM:  A/PROM RIGHT   eval   Shoulder extension   Shoulder flexion 160  Shoulder abduction 175  Shoulder internal rotation 50  Shoulder external rotation 115    (Blank rows = not tested)  A/PROM LEFT   eval  Shoulder extension   Shoulder flexion 134  Shoulder abduction 137  Shoulder internal rotation 43  Shoulder external rotation 100    (Blank rows = not tested)       UPPER EXTREMITY STRENGTH: NT    LYMPHEDEMA ASSESSMENTS:   SURGERY TYPE/DATE: 02/10/2019 Removal of Benign Breast Mass, 06/22/2019 Excision of contracted scar , Endometriosis/hysterectomy surgery 03/04/2022  NUMBER OF LYMPH NODES REMOVED: 1 left axillary with excision of contracted scar  CHEMOTHERAPY: NO  RADIATION:NO  HORMONE TREATMENT: NO  INFECTIONS: YES (multiple seromas after breast  surgery)  LYMPHEDEMA ASSESSMENTS:   LANDMARK RIGHT  eval  10 cm proximal to olecranon process 23.2  Olecranon process 21.7  10 cm proximal to ulnar styloid process 19.4  Just proximal to ulnar styloid process 14.6  Across hand at thumb web  space 18.4  At base of 2nd digit 5.8  (Blank rows = not tested)  LANDMARK LEFT  eval  10 cm proximal to olecranon process 23.1  Olecranon process 21.6  10 cm proximal to ulnar styloid process 18.4  Just proximal to ulnar styloid process 14.5  Across hand at thumb web space 18.3  At base of 2nd digit 5.8  (Blank rows = not tested)      LLIS 43%  BREAST COMPLAINTS QUESTIONNAIRE Pain  4 Heaviness:5 Swollen feeling:5 Tense Skin:3 Redness:0 Bra Print:5 Size of Pores:0 Hard feeling: 0 Total:   22  /80 A Score over 9 indicates lymphedema issues in the breast  TODAY'S TREATMENT:                                                                                                                                         DATE: evaluation of breast, left UE, abdominal region and discussion of plan of care. Pt advised to wear compression bra for breast swelling, and to start wearing her sleeve to assist with cording.  PATIENT EDUCATION:  Education details: POC as above Person educated: Patient Education method: Explanation Education comprehension: verbalized understanding  HOME EXERCISE PROGRAM: Not given today  ASSESSMENT:  CLINICAL IMPRESSION: Patient is a 52 y.o. female who was seen today for physical therapy evaluation and treatment for new complaints of abdominal swelling which started after Endometrial/hysterectomy surgery on 03/04/2022. Pt notes that edema is always worse later in the day and that it is pitting in the central abdomen. It becomes very uncomfortable and prevents her from wearing regular pants . She has also had a return of left breast swelling and aggravation of left UE cording for which she was previously treated in  2021, after a recent flight to [place]. Left shoulder ROM with limitations and decreased scapular mobility. Pt does have a flexi touch that she uses and gets good results with. She will benefit from skilled PT to address deficits and return to PLOF  OBJECTIVE IMPAIRMENTS: decreased knowledge of condition, decreased ROM, hypomobility, increased edema, impaired UE functional use, and pain.   ACTIVITY LIMITATIONS: sleeping, bed mobility, and reach over head  PARTICIPATION LIMITATIONS:  social activities  PERSONAL FACTORS: 1-2 comorbidities: Breast and Left axillary surgery and recent abdominal surgery  are also affecting patient's functional outcome.   REHAB POTENTIAL: Excellent  CLINICAL DECISION MAKING: Stable/uncomplicated  EVALUATION COMPLEXITY: Low  GOALS: Goals reviewed with patient? Yes  SHORT TERM GOALS: Target date: 08/13/2023  Pt will be independent in a HEP for  ROM,NTS, stabilization to improve left shoulder ROM Baseline: Goal status: INITIAL  2.  Pt will be independent in abdominal treatment to reduce swelling Baseline:  Goal status: INITIAL  3.  Pt will have appropriate compression garments for abdominal swelling prn Baseline:  Goal status: INITIAL  4.  Pt will have decreased abdominal pain at night no greater than  3/10 Baseline:  Goal status: INITIAL  5.  Pt will have left shoulder Flex and abd improved by atleast 15 degrees Baseline: 134 flex, 137 abd Goal status: INITIAL  6.  Pt will have decreased pain/median nerve symptoms in left UE to no greater than 2/10 Baseline:  Goal status: INITIAL  LONG TERM GOALS: Target date: 09/03/2023  Pt will have Left shoulder ROM within 7 degrees of right shoulder ROM Baseline: 160 flex, 175 abd Goal status: INITIAL  2.  Pt will have abdominal pain no greater than 1/10 at night to allow better sleep Baseline:  Goal status: INITIAL  3.  Median nerve symptoms in left UE will no longer be present Baseline:  Goal  status: INITIAL  4.  Pt will be independent in self management of Left breast , and abdominal swelling, and shoulder stretching/strengthening Baseline:  Goal status: INITIAL     PLAN:  PT FREQUENCY: 2x/week  PT DURATION: 6 weeks  PLANNED INTERVENTIONS: Therapeutic exercises, Therapeutic activity, Neuromuscular re-education, Patient/Family education, Self Care, Joint mobilization, Orthotic/Fit training, Dry Needling, Manual lymph drainage, scar mobilization, Vasopneumatic device, Manual therapy, and Re-evaluation  PLAN FOR NEXT SESSION: perform and instruct  in abdominal treatment (superficial and deep abdominals), assist with compression wear for abdominals, Review left breast MLD, MFR to cording, scap. Mobility and stabs. Pt has a Flexi touch at home and uses it, check compression sleeves   Claris Pong, PT 07/23/2022, 9:58 AM

## 2022-07-23 ENCOUNTER — Other Ambulatory Visit: Payer: Self-pay

## 2022-07-23 ENCOUNTER — Ambulatory Visit: Payer: Federal, State, Local not specified - PPO | Attending: Obstetrics and Gynecology

## 2022-07-23 DIAGNOSIS — Z483 Aftercare following surgery for neoplasm: Secondary | ICD-10-CM

## 2022-07-23 DIAGNOSIS — M25612 Stiffness of left shoulder, not elsewhere classified: Secondary | ICD-10-CM

## 2022-07-23 DIAGNOSIS — I890 Lymphedema, not elsewhere classified: Secondary | ICD-10-CM

## 2022-07-23 DIAGNOSIS — R600 Localized edema: Secondary | ICD-10-CM | POA: Diagnosis not present

## 2022-07-28 DIAGNOSIS — F4320 Adjustment disorder, unspecified: Secondary | ICD-10-CM | POA: Diagnosis not present

## 2022-07-29 ENCOUNTER — Ambulatory Visit: Payer: Federal, State, Local not specified - PPO

## 2022-07-29 DIAGNOSIS — I890 Lymphedema, not elsewhere classified: Secondary | ICD-10-CM

## 2022-07-29 DIAGNOSIS — R600 Localized edema: Secondary | ICD-10-CM

## 2022-07-29 DIAGNOSIS — M25612 Stiffness of left shoulder, not elsewhere classified: Secondary | ICD-10-CM | POA: Diagnosis not present

## 2022-07-29 DIAGNOSIS — Z483 Aftercare following surgery for neoplasm: Secondary | ICD-10-CM

## 2022-07-29 NOTE — Therapy (Addendum)
OUTPATIENT PHYSICAL THERAPY ONCOLOGY TREATMENT  Patient Name: Sheena Jarvis MRN: [id number] DOB:11/07/1970, 52 y.o., female Today's Date: 07/29/2022  END OF SESSION:  PT End of Session - 07/29/22 0818     Visit Number 2    Number of Visits 12    Date for PT Re-Evaluation 09/03/22    PT Start Time 0808    PT Stop Time 0905    PT Time Calculation (min) 57 min    Activity Tolerance Patient tolerated treatment well    Behavior During Therapy [facility] for tasks assessed/performed             Past Medical History:  Diagnosis Date   Pneumonia    PONV (postoperative nausea and vomiting)    Vision abnormalities    Past Surgical History:  Procedure Laterality Date   APPENDECTOMY     BREAST BIOPSY Left 01/12/2019   [place]   BREAST BIOPSY Left 12/21/2018   benign   BREAST CYST ASPIRATION Left    BREAST EXCISIONAL BIOPSY Left 06/22/2019   Axilla BX - Benign   BREAST EXCISIONAL BIOPSY Left 02/10/2019   [place]   HIP ARTHROSCOPY     MASS EXCISION Left 02/10/2019   Procedure: EXCISION OF LEFT BREAST MASS;  Surgeon: Stark Klein, MD;  Location: [place];  Service: General;  Laterality: Left;   SCAR REVISION Left 06/22/2019   Procedure: LEFT AXILLARY CONTRACTURE RELEASE;  Surgeon: Stark Klein, MD;  Location: [place];  Service: General;  Laterality: Left;   Patient Active Problem List   Diagnosis Date Noted   Numbness 02/04/2016   Cognitive changes 02/04/2016   Other fatigue 02/04/2016     REFERRING PROVIDER: Shawnie Pons MD  REFERRING DIAG: Lymphedema post Endometrial surgery  THERAPY DIAG:  Lymphedema, not elsewhere classified  Aftercare following surgery for neoplasm  Localized edema  Stiffness of left shoulder, not elsewhere classified  ONSET DATE: Mar 04, 2022  Rationale for Evaluation and Treatment: Rehabilitation  SUBJECTIVE:                                                                                                                                                                                            SUBJECTIVE STATEMENT: I want to review the Lt breast MLD and learn the abdominal MLD sequences. I started wearing my compression bra again.   PERTINENT HISTORY:  Pt had recent surgery for Stage II Endometriosis on Oct 31,2024. She had a prior history of removal of benign breast mass on [id number] with complication of seroma and clogged drain and possible infection. She also developed lymphedema in her left breast and axillary  cording. She receieved extensive PT with MLD, compression, manual therapy and dry needling, but had a significant persistent cord that was painful and limited shoulder ROM She underwent and incision of that contracted scar tissue on 06/22/2019 that included one node and develped a seroma after that.  PAIN:  Are you having pain? No, just soreness in axilla.shoulder today   PRECAUTIONS: Left UE lymphedema risk, abdominal swelling from Endometrial surgery/hysterectomy 03/05/2023  WEIGHT BEARING RESTRICTIONS: No  FALLS:  Has patient fallen in last 6 months? 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, integrative lifestyle medicine, pelvic floor, CE  LEISURE: yoga, anything outside, reading  HAND DOMINANCE: left   PRIOR LEVEL OF FUNCTION: Independent  PATIENT GOALS: differential dx, abdominal compression,cording release,   OBJECTIVE:  COGNITION: Overall cognitive status: Within functional limits for tasks assessed   PALPATION: Cording noted left axillary region running to incision, and upper arm  OBSERVATIONS / OTHER ASSESSMENTS: generalized left breast swelling;no enlarged pores or fibrotic areas noted, limitations in left shoulder ROM and cording noted left axillary region into upper arm. Release techniques produce tingling in median nerve distribution of hand. Decreased left  scapular mobility. No visible abdominal swelling this am, but pt notices fullness that becomes pitting later in the day in the central abdomen. No rebound tenderness today noted in any bilateral upper or lower quarters of abdomen. 3 well healed abdominal incisions.  SENSATION: Light touch: Appears intact    POSTURE: WNL  UPPER EXTREMITY AROM/PROM:  A/PROM RIGHT   eval   Shoulder extension   Shoulder flexion 160  Shoulder abduction 175  Shoulder internal rotation 50  Shoulder external rotation 115    (Blank rows = not tested)  A/PROM LEFT   eval  Shoulder extension   Shoulder flexion 134  Shoulder abduction 137  Shoulder internal rotation 43  Shoulder external rotation 100    (Blank rows = not tested)       UPPER EXTREMITY STRENGTH: NT    LYMPHEDEMA ASSESSMENTS:   SURGERY TYPE/DATE: 02/10/2019 Removal of Benign Breast Mass, 06/22/2019 Excision of contracted scar , Endometriosis/hysterectomy surgery 03/04/2022  NUMBER OF LYMPH NODES REMOVED: 1 left axillary with excision of contracted scar  CHEMOTHERAPY: NO  RADIATION:NO  HORMONE TREATMENT: NO  INFECTIONS: YES (multiple seromas after breast surgery)  LYMPHEDEMA ASSESSMENTS:   LANDMARK RIGHT  eval  10 cm proximal to olecranon process 23.2  Olecranon process 21.7  10 cm proximal to ulnar styloid process 19.4  Just proximal to ulnar styloid process 14.6  Across hand at thumb web space 18.4  At base of 2nd digit 5.8  (Blank rows = not tested)  LANDMARK LEFT  eval  10 cm proximal to olecranon process 23.1  Olecranon process 21.6  10 cm proximal to ulnar styloid process 18.4  Just proximal to ulnar styloid process 14.5  Across hand at thumb web space 18.3  At base of 2nd digit 5.8  (Blank rows = not tested)      LLIS 43%  BREAST COMPLAINTS QUESTIONNAIRE Pain  4 Heaviness:5 Swollen feeling:5 Tense Skin:3 Redness:0 Bra Print:5 Size of Pores:0 Hard feeling: 0 Total:   22  /80 A Score over  9 indicates lymphedema issues in the breast  TODAY'S TREATMENT:  DATE:  07/29/22: Self Care Explained anatomy of lymphatic system to pt and basic principles of MLD and how the abdominal sequences we normally do could help alleviate the intermittent abdominal swelling but it might also be helpful to get some well fitting, high waisted biker shorts to help with compression against the abdomen prn. She could also use her swell spot with this intermittently as well.  Manual Therapy MLD: In Supine: Short neck, superficial and deep abdominals, Lt inguinal nodes, Lt axillo-inguinal anastomosis, Rt axillary and pectoral nodes, Rt intact upper quadrant sequence, anterior inter-axillary anastomosis, then Lt breast reviewing with pt while performing P/ROM to Lt shoulder into flexion, abd and D2 to pts end available ROM; neural tension stretch as well MFR to Lt axilla during P/ROM and also to forearm where cording palpable  07/23/22:evaluation of breast, left UE, abdominal region and discussion of plan of care. Pt advised to wear compression bra for breast swelling, and to start wearing her sleeve to assist with cording.  PATIENT EDUCATION:  Education details: Superficial and deep abdominals Person educated: Patient Education method: Explanation, demonstration, handout issued (drawn by therapist) Education comprehension: verbalized understanding, returned demo and will benefit from further review  HOME EXERCISE PROGRAM: Superficial and deep abdominals, handout issued   ASSESSMENT:  CLINICAL IMPRESSION: Spent beginning of session answering pts questions about lymphatic system and basic principles of MLD. Then began manual therapy working on MLD to include superficial and deep abdominals and Lt breast. Pt has begun wearing her compression bra again so encouraged her to cont  this. Also discussed possibility of getting high waisted bike shorts to see if that will help with abdominal edema she's been experiencing since her recent surgery. Pt verbalized understanding.   OBJECTIVE IMPAIRMENTS: decreased knowledge of condition, decreased ROM, hypomobility, increased edema, impaired UE functional use, and pain.   ACTIVITY LIMITATIONS: sleeping, bed mobility, and reach over head  PARTICIPATION LIMITATIONS:  social activities  PERSONAL FACTORS: 1-2 comorbidities: Breast and Left axillary surgery and recent abdominal surgery  are also affecting patient's functional outcome.   REHAB POTENTIAL: Excellent  CLINICAL DECISION MAKING: Stable/uncomplicated  EVALUATION COMPLEXITY: Low  GOALS: Goals reviewed with patient? Yes  SHORT TERM GOALS: Target date: 08/13/2023  Pt will be independent in a HEP for  ROM,NTS, stabilization to improve left shoulder ROM Baseline: Goal status: INITIAL  2.  Pt will be independent in abdominal treatment to reduce swelling Baseline:  Goal status: INITIAL  3.  Pt will have appropriate compression garments for abdominal swelling prn Baseline:  Goal status: INITIAL  4.  Pt will have decreased abdominal pain at night no greater than 3/10 Baseline:  Goal status: INITIAL  5.  Pt will have left shoulder Flex and abd improved by atleast 15 degrees Baseline: 134 flex, 137 abd Goal status: INITIAL  6.  Pt will have decreased pain/median nerve symptoms in left UE to no greater than 2/10 Baseline:  Goal status: INITIAL  LONG TERM GOALS: Target date: 09/03/2023  Pt will have Left shoulder ROM within 7 degrees of right shoulder ROM Baseline: 160 flex, 175 abd Goal status: INITIAL  2.  Pt will have abdominal pain no greater than 1/10 at night to allow better sleep Baseline:  Goal status: INITIAL  3.  Median nerve symptoms in left UE will no longer be present Baseline:  Goal status: INITIAL  4.  Pt will be independent in self  management of Left breast , and abdominal swelling, and shoulder stretching/strengthening Baseline:  Goal status:  INITIAL     PLAN:  PT FREQUENCY: 2x/week  PT DURATION: 6 weeks  PLANNED INTERVENTIONS: Therapeutic exercises, Therapeutic activity, Neuromuscular re-education, Patient/Family education, Self Care, Joint mobilization, Orthotic/Fit training, Dry Needling, Manual lymph drainage, scar mobilization, Vasopneumatic device, Manual therapy, and Re-evaluation  PLAN FOR NEXT SESSION: Cont abdominal treatment (superficial and deep abdominals), assist with compression wear for abdominals, Review left breast MLD, MFR to cording, scap. Mobility and stabs. Pt has a Flexi touch at home and uses it, check compression sleeves   Otelia Limes, PTA 07/29/2022, 1:23 PM

## 2022-08-04 ENCOUNTER — Ambulatory Visit: Payer: Federal, State, Local not specified - PPO | Attending: Obstetrics and Gynecology

## 2022-08-04 DIAGNOSIS — I890 Lymphedema, not elsewhere classified: Secondary | ICD-10-CM

## 2022-08-04 DIAGNOSIS — Z483 Aftercare following surgery for neoplasm: Secondary | ICD-10-CM | POA: Diagnosis not present

## 2022-08-04 DIAGNOSIS — M25612 Stiffness of left shoulder, not elsewhere classified: Secondary | ICD-10-CM | POA: Diagnosis not present

## 2022-08-04 DIAGNOSIS — R600 Localized edema: Secondary | ICD-10-CM | POA: Insufficient documentation

## 2022-08-04 NOTE — Therapy (Signed)
OUTPATIENT PHYSICAL THERAPY ONCOLOGY TREATMENT  Patient Name: Sheena Jarvis MRN: [id number] DOB:05/04/1971, 52 y.o., female Today's Date: 08/04/2022  END OF SESSION:  PT End of Session - 08/04/22 1559     Visit Number 3    Number of Visits 12    Date for PT Re-Evaluation 09/03/22    PT Start Time 1600    PT Stop Time 1700    PT Time Calculation (min) 60 min    Activity Tolerance Patient tolerated treatment well    Behavior During Therapy [facility] for tasks assessed/performed             Past Medical History:  Diagnosis Date   Pneumonia    PONV (postoperative nausea and vomiting)    Vision abnormalities    Past Surgical History:  Procedure Laterality Date   APPENDECTOMY     BREAST BIOPSY Left 01/12/2019   [place]   BREAST BIOPSY Left 12/21/2018   benign   BREAST CYST ASPIRATION Left    BREAST EXCISIONAL BIOPSY Left 06/22/2019   Axilla BX - Benign   BREAST EXCISIONAL BIOPSY Left 02/10/2019   [place]   HIP ARTHROSCOPY     MASS EXCISION Left 02/10/2019   Procedure: EXCISION OF LEFT BREAST MASS;  Surgeon: Stark Klein, MD;  Location: [place];  Service: General;  Laterality: Left;   SCAR REVISION Left 06/22/2019   Procedure: LEFT AXILLARY CONTRACTURE RELEASE;  Surgeon: Stark Klein, MD;  Location: [place];  Service: General;  Laterality: Left;   Patient Active Problem List   Diagnosis Date Noted   Numbness 02/04/2016   Cognitive changes 02/04/2016   Other fatigue 02/04/2016     REFERRING PROVIDER: Shawnie Pons MD  REFERRING DIAG: Lymphedema post Endometrial surgery  THERAPY DIAG:  Lymphedema, not elsewhere classified  Aftercare following surgery for neoplasm  Localized edema  Stiffness of left shoulder, not elsewhere classified  ONSET DATE: Mar 04, 2022  Rationale for Evaluation and Treatment: Rehabilitation  SUBJECTIVE:                                                                                                                                                                                            SUBJECTIVE STATEMENT: I have been wearing the compression bra and it helps when I do wear it. It helps the burning and tingling. I have worn the sleeve too and it helps with the neurologic symptoms. Still feel the tightness from the cording and it feels better after treatment. I need to look into getting some new compression shorts and I haven't done that yet. 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  PERTINENT HISTORY:  Pt had recent surgery for Stage II Endometriosis on Oct 31,2024. She had a prior history of removal of benign breast mass on [id number] with complication of seroma and clogged drain and possible infection. She also developed lymphedema in her left breast and axillary cording. She receieved extensive PT with MLD, compression, manual therapy and dry needling, but had a significant persistent cord that was painful and limited shoulder ROM She underwent and incision of that contracted scar tissue on 06/22/2019 that included one node and develped a seroma after that.  PAIN:  Are you having pain? No, just achy along the cord and left breast.   PRECAUTIONS: Left UE lymphedema risk, abdominal swelling from Endometrial surgery/hysterectomy 03/05/2023  WEIGHT BEARING RESTRICTIONS: No  FALLS:  Has patient fallen in last 6 months? 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, integrative lifestyle medicine, pelvic floor, CE  LEISURE: yoga, anything outside, reading  HAND DOMINANCE: left   PRIOR LEVEL OF FUNCTION: Independent  PATIENT GOALS: differential dx, abdominal compression,cording release,   OBJECTIVE:  COGNITION: Overall cognitive status: Within functional limits for tasks assessed   PALPATION: Cording noted left axillary region running to incision, and upper arm  OBSERVATIONS /  OTHER ASSESSMENTS: generalized left breast swelling;no enlarged pores or fibrotic areas noted, limitations in left shoulder ROM and cording noted left axillary region into upper arm. Release techniques produce tingling in median nerve distribution of hand. Decreased left scapular mobility. No visible abdominal swelling this am, but pt notices fullness that becomes pitting later in the day in the central abdomen. No rebound tenderness today noted in any bilateral upper or lower quarters of abdomen. 3 well healed abdominal incisions.  SENSATION: Light touch: Appears intact    POSTURE: WNL  UPPER EXTREMITY AROM/PROM:  A/PROM RIGHT   eval   Shoulder extension   Shoulder flexion 160  Shoulder abduction 175  Shoulder internal rotation 50  Shoulder external rotation 115    (Blank rows = not tested)  A/PROM LEFT   eval  Shoulder extension   Shoulder flexion 134  Shoulder abduction 137  Shoulder internal rotation 43  Shoulder external rotation 100    (Blank rows = not tested)       UPPER EXTREMITY STRENGTH: NT    LYMPHEDEMA ASSESSMENTS:   SURGERY TYPE/DATE: 02/10/2019 Removal of Benign Breast Mass, 06/22/2019 Excision of contracted scar , Endometriosis/hysterectomy surgery 03/04/2022  NUMBER OF LYMPH NODES REMOVED: 1 left axillary with excision of contracted scar  CHEMOTHERAPY: NO  RADIATION:NO  HORMONE TREATMENT: NO  INFECTIONS: YES (multiple seromas after breast surgery)  LYMPHEDEMA ASSESSMENTS:   LANDMARK RIGHT  eval  10 cm proximal to olecranon process 23.2  Olecranon process 21.7  10 cm proximal to ulnar styloid process 19.4  Just proximal to ulnar styloid process 14.6  Across hand at thumb web space 18.4  At base of 2nd digit 5.8  (Blank rows = not tested)  LANDMARK LEFT  eval  10 cm proximal to olecranon process 23.1  Olecranon process 21.6  10 cm proximal to ulnar styloid process 18.4  Just proximal to ulnar styloid process 14.5  Across hand at  thumb web space 18.3  At base of 2nd digit 5.8  (Blank rows = not tested)      LLIS 43%  BREAST COMPLAINTS QUESTIONNAIRE Pain  4 Heaviness:5 Swollen feeling:5 Tense Skin:3 Redness:0 Bra Print:5 Size  of Pores:0 Hard feeling: 0 Total:   22  /80 A Score over 9 indicates lymphedema issues in the breast  TODAY'S TREATMENT:                                                                                                                                         DATE:   08/04/2022  MFR to Lt axilla and also to forearm where cording is noted and tightness is felt. Pts arm in scaption resting on pillow. Pt also applied pressure into axillary region several times to increase stretch. Longitudinal and S technique utilized. Also tried with pts arm into more flexion but discontinued due to impingement MLD: In Supine: Short neck, superficial and deep abdominals, Lt inguinal nodes, Lt axillo-inguinal anastomosis, Rt axillary and pectoral nodes, Rt intact upper quadrant sequence, anterior inter-axillary anastomosis, then Lt breast reviewing with pt while performing. Pt practiced abdominal techniques after therapist demonstrated. Pt given superficial and deep abdominal handout.    07/29/22: Self Care Explained anatomy of lymphatic system to pt and basic principles of MLD and how the abdominal sequences we normally do could help alleviate the intermittent abdominal swelling but it might also be helpful to get some well fitting, high waisted biker shorts to help with compression against the abdomen prn. She could also use her swell spot with this intermittently as well.  Manual Therapy MLD: In Supine: Short neck, superficial and deep abdominals, Lt inguinal nodes, Lt axillo-inguinal anastomosis, Rt axillary and pectoral nodes, Rt intact upper quadrant sequence, anterior inter-axillary anastomosis, then Lt breast reviewing with pt while performing P/ROM to Lt shoulder into flexion, abd and D2 to pts end  available ROM; neural tension stretch as well MFR to Lt axilla during P/ROM and also to forearm where cording palpable  07/23/22:evaluation of breast, left UE, abdominal region and discussion of plan of care. Pt advised to wear compression bra for breast swelling, and to start wearing her sleeve to assist with cording.  PATIENT EDUCATION:  Education details: Superficial and deep abdominals Person educated: Patient Education method: Explanation, demonstration, handout issued (drawn by therapist) Education comprehension: verbalized understanding, returned demo and will benefit from further review  HOME EXERCISE PROGRAM: Superficial and deep abdominals, handout issued   ASSESSMENT:  CLINICAL IMPRESSION: Performed MFR techniques to left UE cording, then continued manual therapy working on MLD to include superficial and deep abdominals , right intact sequence and Lt breast. Pt performed superficial and deep abdominal techniques with occasional cueing for technique. Pts arm achiness was improved after treatment. She does get intermittent impingement symptoms  and may benefit from scapular mobility and strengthening. OBJECTIVE IMPAIRMENTS: decreased knowledge of condition, decreased ROM, hypomobility, increased edema, impaired UE functional use, and pain.   ACTIVITY LIMITATIONS: sleeping, bed mobility, and reach over head  PARTICIPATION LIMITATIONS:  social activities  PERSONAL FACTORS: 1-2 comorbidities: Breast and Left axillary surgery and recent abdominal surgery  are also affecting patient's functional outcome.   REHAB POTENTIAL: Excellent  CLINICAL DECISION MAKING: Stable/uncomplicated  EVALUATION COMPLEXITY: Low  GOALS: Goals reviewed with patient? Yes  SHORT TERM GOALS: Target date: 08/13/2023  Pt will be independent in a HEP for  ROM,NTS, stabilization to improve left shoulder ROM Baseline: Goal status: INITIAL  2.  Pt will be independent in abdominal treatment to reduce  swelling Baseline:  Goal status: INITIAL  3.  Pt will have appropriate compression garments for abdominal swelling prn Baseline:  Goal status: INITIAL  4.  Pt will have decreased abdominal pain at night no greater than 3/10 Baseline:  Goal status: INITIAL  5.  Pt will have left shoulder Flex and abd improved by atleast 15 degrees Baseline: 134 flex, 137 abd Goal status: INITIAL  6.  Pt will have decreased pain/median nerve symptoms in left UE to no greater than 2/10 Baseline:  Goal status: INITIAL  LONG TERM GOALS: Target date: 09/03/2023  Pt will have Left shoulder ROM within 7 degrees of right shoulder ROM Baseline: 160 flex, 175 abd Goal status: INITIAL  2.  Pt will have abdominal pain no greater than 1/10 at night to allow better sleep Baseline:  Goal status: INITIAL  3.  Median nerve symptoms in left UE will no longer be present Baseline:  Goal status: INITIAL  4.  Pt will be independent in self management of Left breast , and abdominal swelling, and shoulder stretching/strengthening Baseline:  Goal status: INITIAL     PLAN:  PT FREQUENCY: 2x/week  PT DURATION: 6 weeks  PLANNED INTERVENTIONS: Therapeutic exercises, Therapeutic activity, Neuromuscular re-education, Patient/Family education, Self Care, Joint mobilization, Orthotic/Fit training, Dry Needling, Manual lymph drainage, scar mobilization, Vasopneumatic device, Manual therapy, and Re-evaluation  PLAN FOR NEXT SESSION: Cont abdominal treatment (superficial and deep abdominals), did pt get compression wear for abdominals, Review left breast MLD, MFR to cording, scap. Mobility and stabs. Pt has a Flexi touch at home and uses it, check compression sleeves   Claris Pong, PT 08/04/2022, 5:15 PM

## 2022-08-11 DIAGNOSIS — F4320 Adjustment disorder, unspecified: Secondary | ICD-10-CM | POA: Diagnosis not present

## 2022-08-13 DIAGNOSIS — F4320 Adjustment disorder, unspecified: Secondary | ICD-10-CM | POA: Diagnosis not present

## 2022-08-18 ENCOUNTER — Ambulatory Visit: Payer: Federal, State, Local not specified - PPO

## 2022-08-18 DIAGNOSIS — Z483 Aftercare following surgery for neoplasm: Secondary | ICD-10-CM

## 2022-08-18 DIAGNOSIS — R600 Localized edema: Secondary | ICD-10-CM | POA: Diagnosis not present

## 2022-08-18 DIAGNOSIS — F4320 Adjustment disorder, unspecified: Secondary | ICD-10-CM | POA: Diagnosis not present

## 2022-08-18 DIAGNOSIS — I890 Lymphedema, not elsewhere classified: Secondary | ICD-10-CM

## 2022-08-18 DIAGNOSIS — M25612 Stiffness of left shoulder, not elsewhere classified: Secondary | ICD-10-CM

## 2022-08-18 NOTE — Therapy (Signed)
OUTPATIENT PHYSICAL THERAPY ONCOLOGY TREATMENT  Patient Name: Sheena Jarvis MRN: [id number] DOB:1970/09/03, 52 y.o., female Today's Date: 08/18/2022  END OF SESSION:  PT End of Session - 08/18/22 1604     Visit Number 4    Number of Visits 12    Date for PT Re-Evaluation 09/03/22    PT Start Time 1605    PT Stop Time 1658    PT Time Calculation (min) 53 min    Activity Tolerance Patient tolerated treatment well    Behavior During Therapy WFL for tasks assessed/performed             Past Medical History:  Diagnosis Date   Pneumonia    PONV (postoperative nausea and vomiting)    Vision abnormalities    Past Surgical History:  Procedure Laterality Date   APPENDECTOMY     BREAST BIOPSY Left 01/12/2019   Pash   BREAST BIOPSY Left 12/21/2018   benign   BREAST CYST ASPIRATION Left    BREAST EXCISIONAL BIOPSY Left 06/22/2019   Axilla BX - Benign   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   HIP ARTHROSCOPY     MASS EXCISION Left 02/10/2019   Procedure: EXCISION OF LEFT BREAST MASS;  Surgeon: Almond Lint, MD;  Location: [facility];  Service: General;  Laterality: Left;   SCAR REVISION Left 06/22/2019   Procedure: LEFT AXILLARY CONTRACTURE RELEASE;  Surgeon: Almond Lint, MD;  Location: [facility];  Service: General;  Laterality: Left;   Patient Active Problem List   Diagnosis Date Noted   Numbness 02/04/2016   Cognitive changes 02/04/2016   Other fatigue 02/04/2016     REFERRING PROVIDER: Remi Deter MD  REFERRING DIAG: Lymphedema post Endometrial surgery  THERAPY DIAG:  Lymphedema, not elsewhere classified  Aftercare following surgery for neoplasm  Localized edema  Stiffness of left shoulder, not elsewhere classified  ONSET DATE: Mar 04, 2022  Rationale for Evaluation and Treatment: Rehabilitation  SUBJECTIVE:                                                                                                                                                                                            SUBJECTIVE STATEMENT: Pt was able to get compression shorts and wore for several days. I have continued the pump, and wearing the sleeve. I am no longer waking up at night with abdominal pain. A CLT at the class worked on the coding in my left arm and it feels better.  She could feel it less in there after working on it.. Cording still really there as I range my shoulder  and feels like tearing with scapular protraction. I can't go more than a few days without my compression bra.  PERTINENT HISTORY:  Pt had recent surgery for Stage II Endometriosis on Oct 31,2024. She had a prior history of removal of benign breast mass on 02/10/2019 with complication of seroma and clogged drain and possible infection. She also developed lymphedema in her left breast and axillary cording. She receieved extensive PT with MLD, compression, manual therapy and dry needling, but had a significant persistent cord that was painful and limited shoulder ROM She underwent and incision of that contracted scar tissue on 06/22/2019 that included one node and develped a seroma after that.  PAIN:  Are you having pain? No, just achy along the cord and left breast.   PRECAUTIONS: Left UE lymphedema risk, abdominal swelling from Endometrial surgery/hysterectomy 03/05/2023  WEIGHT BEARING RESTRICTIONS: No  FALLS:  Has patient fallen in last 6 months? 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, integrative lifestyle medicine, pelvic floor, CE  LEISURE: yoga, anything outside, reading  HAND DOMINANCE: left   PRIOR LEVEL OF FUNCTION: Independent  PATIENT GOALS: differential dx, abdominal compression,cording release,   OBJECTIVE:  COGNITION: Overall cognitive status: Within functional limits for tasks assessed   PALPATION: Cording noted  left axillary region running to incision, and upper arm  OBSERVATIONS / OTHER ASSESSMENTS: generalized left breast swelling;no enlarged pores or fibrotic areas noted, limitations in left shoulder ROM and cording noted left axillary region into upper arm. Release techniques produce tingling in median nerve distribution of hand. Decreased left scapular mobility. No visible abdominal swelling this am, but pt notices fullness that becomes pitting later in the day in the central abdomen. No rebound tenderness today noted in any bilateral upper or lower quarters of abdomen. 3 well healed abdominal incisions.  SENSATION: Light touch: Appears intact    POSTURE: WNL  UPPER EXTREMITY AROM/PROM:  A/PROM RIGHT   eval   Shoulder extension   Shoulder flexion 160  Shoulder abduction 175  Shoulder internal rotation 50  Shoulder external rotation 115    (Blank rows = not tested)  A/PROM LEFT   eval  Shoulder extension   Shoulder flexion 134  Shoulder abduction 137  Shoulder internal rotation 43  Shoulder external rotation 100    (Blank rows = not tested)       UPPER EXTREMITY STRENGTH: NT    LYMPHEDEMA ASSESSMENTS:   SURGERY TYPE/DATE: 02/10/2019 Removal of Benign Breast Mass, 06/22/2019 Excision of contracted scar , Endometriosis/hysterectomy surgery 03/04/2022  NUMBER OF LYMPH NODES REMOVED: 1 left axillary with excision of contracted scar  CHEMOTHERAPY: NO  RADIATION:NO  HORMONE TREATMENT: NO  INFECTIONS: YES (multiple seromas after breast surgery)  LYMPHEDEMA ASSESSMENTS:   LANDMARK RIGHT  eval  10 cm proximal to olecranon process 23.2  Olecranon process 21.7  10 cm proximal to ulnar styloid process 19.4  Just proximal to ulnar styloid process 14.6  Across hand at thumb web space 18.4  At base of 2nd digit 5.8  (Blank rows = not tested)  LANDMARK LEFT  eval  10 cm proximal to olecranon process 23.1  Olecranon process 21.6  10 cm proximal to ulnar styloid  process 18.4  Just proximal to ulnar styloid process 14.5  Across hand at thumb web space 18.3  At base of 2nd digit 5.8  (Blank rows = not tested)      LLIS 43%  BREAST COMPLAINTS QUESTIONNAIRE Pain  4 Heaviness:5 Swollen feeling:5 Tense Skin:3 Redness:0 Bra Print:5 Size of Pores:0 Hard feeling: 0 Total:   22  /80 A Score over 9 indicates lymphedema issues in the breast  TODAY'S TREATMENT:                                                                                                                                         DATE:   08/18/2022 MFR to Lt axilla and also to forearm where cording is noted and tightness is felt. Pts arm in scaption resting on pillow and also at 90 degrees abduction. Pt also applied pressure into axillary region while therapist performed release techniques to increase stretch on cords. Longitudinal and S technique utilized. PROM to left shoulder flexion, scaption, abduction, with pinning at axilla with no impingement felt. In supine: MLD to left UE :Short neck,  R axillary nodes and establishment of interaxillary pathway, L inguinal nodes and establishment of axilloinguinal pathway, then L UE working proximal to distal, moving fluid from upper inner arm outwards, and doing both sides of forearm moving fluid towards pathways then retracing all steps . Pt given handout for intact sequence and new handout for superficial abdominals  08/04/2022  MFR to Lt axilla and also to forearm where cording is noted and tightness is felt. Pts arm in scaption resting on pillow. Pt also applied pressure into axillary region several times to increase stretch. Longitudinal and S technique utilized. Also tried with pts arm into more flexion but discontinued due to impingement  MLD: In Supine: Short neck, superficial and deep abdominals, Lt inguinal nodes, Lt axillo-inguinal anastomosis, Rt axillary and pectoral nodes, Rt intact upper quadrant sequence, anterior inter-axillary  anastomosis, then Lt breast reviewing with pt while performing. Pt practiced abdominal techniques after therapist demonstrated. Pt given superficial and deep abdominal handout.    07/29/22: Self Care Explained anatomy of lymphatic system to pt and basic principles of MLD and how the abdominal sequences we normally do could help alleviate the intermittent abdominal swelling but it might also be helpful to get some well fitting, high waisted biker shorts to help with compression against the abdomen prn. She could also use her swell spot with this intermittently as well.  Manual Therapy MLD: In Supine: Short neck, superficial and deep abdominals, Lt inguinal nodes, Lt axillo-inguinal anastomosis, Rt axillary and pectoral nodes, Rt intact upper quadrant sequence, anterior inter-axillary anastomosis, then Lt breast reviewing with pt while performing P/ROM to Lt shoulder into flexion, abd and D2 to pts end available ROM; neural tension stretch as well MFR to Lt axilla during P/ROM and also to forearm where cording palpable  07/23/22:evaluation of breast, left UE, abdominal region and discussion of plan of care. Pt advised to wear compression bra for breast swelling, and to start wearing her sleeve to assist with cording.  PATIENT EDUCATION:  Education details: Superficial and deep  abdominals Person educated: Patient Education method: Explanation, demonstration, handout issued (drawn by therapist) Education comprehension: verbalized understanding, returned demo and will benefit from further review  HOME EXERCISE PROGRAM: Superficial and deep abdominals, handout issued   ASSESSMENT:  CLINICAL IMPRESSION: Pt continues with cording very prominent at Left pectoral/axillary region and less prominent today in upper arm and forearm region although still palpable. Pt assisted at axilla while therapist performed release techniques. Veins on left UE more prominent than right OBJECTIVE IMPAIRMENTS: decreased  knowledge of condition, decreased ROM, hypomobility, increased edema, impaired UE functional use, and pain.   ACTIVITY LIMITATIONS: sleeping, bed mobility, and reach over head  PARTICIPATION LIMITATIONS:  social activities  PERSONAL FACTORS: 1-2 comorbidities: Breast and Left axillary surgery and recent abdominal surgery  are also affecting patient's functional outcome.   REHAB POTENTIAL: Excellent  CLINICAL DECISION MAKING: Stable/uncomplicated  EVALUATION COMPLEXITY: Low  GOALS: Goals reviewed with patient? Yes  SHORT TERM GOALS: Target date: 08/13/2023  Pt will be independent in a HEP for  ROM,NTS, stabilization to improve left shoulder ROM Baseline: Goal status: INITIAL  2.  Pt will be independent in abdominal treatment to reduce swelling Baseline:  Goal status: INITIAL  3.  Pt will have appropriate compression garments for abdominal swelling prn Baseline:  Goal status: MET 08/18/2022 4.  Pt will have decreased abdominal pain at night no greater than 3/10 Baseline:  Goal status: ongoing 5.  Pt will have left shoulder Flex and abd improved by atleast 15 degrees Baseline: 134 flex, 137 abd Goal status: INITIAL  6.  Pt will have decreased pain/median nerve symptoms in left UE to no greater than 2/10 Baseline:  Goal status: INITIAL  LONG TERM GOALS: Target date: 09/03/2023  Pt will have Left shoulder ROM within 7 degrees of right shoulder ROM Baseline: 160 flex, 175 abd Goal status: INITIAL  2.  Pt will have abdominal pain no greater than 1/10 at night to allow better sleep Baseline:  Goal status: INITIAL  3.  Median nerve symptoms in left UE will no longer be present Baseline:  Goal status: INITIAL  4.  Pt will be independent in self management of Left breast , and abdominal swelling, and shoulder stretching/strengthening Baseline:  Goal status: INITIAL     PLAN:  PT FREQUENCY: 2x/week  PT DURATION: 6 weeks  PLANNED INTERVENTIONS: Therapeutic  exercises, Therapeutic activity, Neuromuscular re-education, Patient/Family education, Self Care, Joint mobilization, Orthotic/Fit training, Dry Needling, Manual lymph drainage, scar mobilization, Vasopneumatic device, Manual therapy, and Re-evaluation  PLAN FOR NEXT SESSION: Cont abdominal treatment (superficial and deep abdominals)Review page with redone numbers,review intact sequence,  Review left breast MLD, MFR to cording, scap. Mobility and stabs. Pt has a Flexi touch at home and uses it, check compression sleeves   Waynette Buttery, PT 08/18/2022, 5:06 PM

## 2022-08-22 ENCOUNTER — Ambulatory Visit: Payer: Federal, State, Local not specified - PPO

## 2022-08-22 DIAGNOSIS — R600 Localized edema: Secondary | ICD-10-CM

## 2022-08-22 DIAGNOSIS — Z483 Aftercare following surgery for neoplasm: Secondary | ICD-10-CM

## 2022-08-22 DIAGNOSIS — M25612 Stiffness of left shoulder, not elsewhere classified: Secondary | ICD-10-CM

## 2022-08-22 DIAGNOSIS — I890 Lymphedema, not elsewhere classified: Secondary | ICD-10-CM

## 2022-08-22 NOTE — Therapy (Signed)
OUTPATIENT PHYSICAL THERAPY ONCOLOGY TREATMENT  Patient Name: Sheena Jarvis MRN: [id number] DOB:[birth year], 52 y.o., female Today's Date: 08/22/2022  END OF SESSION:  PT End of Session - 08/22/22 0759     Visit Number 5    Number of Visits 12    Date for PT Re-Evaluation 09/03/22    PT Start Time 0800    PT Stop Time 0900    PT Time Calculation (min) 60 min    Activity Tolerance Patient tolerated treatment well    Behavior During Therapy [facility] for tasks assessed/performed             Past Medical History:  Diagnosis Date   Pneumonia    PONV (postoperative nausea and vomiting)    Vision abnormalities    Past Surgical History:  Procedure Laterality Date   APPENDECTOMY     BREAST BIOPSY Left 01/12/2019   Pash   BREAST BIOPSY Left 12/21/2018   benign   BREAST CYST ASPIRATION Left    BREAST EXCISIONAL BIOPSY Left 06/22/2019   Axilla BX - Benign   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   HIP ARTHROSCOPY     MASS EXCISION Left 02/10/2019   Procedure: EXCISION OF LEFT BREAST MASS;  Surgeon: Almond Lint, MD;  Location: [facility];  Service: General;  Laterality: Left;   SCAR REVISION Left 06/22/2019   Procedure: LEFT AXILLARY CONTRACTURE RELEASE;  Surgeon: Almond Lint, MD;  Location: [facility];  Service: General;  Laterality: Left;   Patient Active Problem List   Diagnosis Date Noted   Numbness 02/04/2016   Cognitive changes 02/04/2016   Other fatigue 02/04/2016     REFERRING PROVIDER: Remi Deter MD  REFERRING DIAG: Lymphedema post Endometrial surgery  THERAPY DIAG:  Lymphedema, not elsewhere classified  Aftercare following surgery for neoplasm  Localized edema  Stiffness of left shoulder, not elsewhere classified  ONSET DATE: Mar 04, 2022  Rationale for Evaluation and Treatment: Rehabilitation  SUBJECTIVE:                                                                                                                                                                                            SUBJECTIVE STATEMENT: I am sleeping a lot better. I can still feel swelling in the left breast. Its good if I wear the compression but the next am the tingling is back.. My abdominal swelling is better and staying better.   PERTINENT HISTORY:  Pt had recent surgery for Stage II Endometriosis on Oct 31,2024. She had a prior history of removal of benign breast mass on 02/10/2019 with complication of  seroma and clogged drain and possible infection. She also developed lymphedema in her left breast and axillary cording. She receieved extensive PT with MLD, compression, manual therapy and dry needling, but had a significant persistent cord that was painful and limited shoulder ROM She underwent and incision of that contracted scar tissue on 06/22/2019 that included one node and develped a seroma after that.  PAIN:  Are you having pain?  3/10 and  just achy along the cord and left breast.   PRECAUTIONS: Left UE lymphedema risk, abdominal swelling from Endometrial surgery/hysterectomy 03/05/2023  WEIGHT BEARING RESTRICTIONS: No  FALLS:  Has patient fallen in last 6 months? 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, integrative lifestyle medicine, pelvic floor, CE  LEISURE: yoga, anything outside, reading  HAND DOMINANCE: left   PRIOR LEVEL OF FUNCTION: Independent  PATIENT GOALS: differential dx, abdominal compression,cording release,   OBJECTIVE:  COGNITION: Overall cognitive status: Within functional limits for tasks assessed   PALPATION: Cording noted left axillary region running to incision, and upper arm  OBSERVATIONS / OTHER ASSESSMENTS: generalized left breast swelling;no enlarged pores or fibrotic areas noted, limitations in left shoulder ROM and cording noted left axillary region into upper arm.  Release techniques produce tingling in median nerve distribution of hand. Decreased left scapular mobility. No visible abdominal swelling this am, but pt notices fullness that becomes pitting later in the day in the central abdomen. No rebound tenderness today noted in any bilateral upper or lower quarters of abdomen. 3 well healed abdominal incisions.  SENSATION: Light touch: Appears intact    POSTURE: WNL  UPPER EXTREMITY AROM/PROM:  A/PROM RIGHT   eval   Shoulder extension   Shoulder flexion 160  Shoulder abduction 175  Shoulder internal rotation 50  Shoulder external rotation 115    (Blank rows = not tested)  A/PROM LEFT   eval  Shoulder extension   Shoulder flexion 134  Shoulder abduction 137  Shoulder internal rotation 43  Shoulder external rotation 100    (Blank rows = not tested)       UPPER EXTREMITY STRENGTH: NT    LYMPHEDEMA ASSESSMENTS:   SURGERY TYPE/DATE: 02/10/2019 Removal of Benign Breast Mass, 06/22/2019 Excision of contracted scar , Endometriosis/hysterectomy surgery 03/04/2022  NUMBER OF LYMPH NODES REMOVED: 1 left axillary with excision of contracted scar  CHEMOTHERAPY: NO  RADIATION:NO  HORMONE TREATMENT: NO  INFECTIONS: YES (multiple seromas after breast surgery)  LYMPHEDEMA ASSESSMENTS:   LANDMARK RIGHT  eval  10 cm proximal to olecranon process 23.2  Olecranon process 21.7  10 cm proximal to ulnar styloid process 19.4  Just proximal to ulnar styloid process 14.6  Across hand at thumb web space 18.4  At base of 2nd digit 5.8  (Blank rows = not tested)  LANDMARK LEFT  eval  10 cm proximal to olecranon process 23.1  Olecranon process 21.6  10 cm proximal to ulnar styloid process 18.4  Just proximal to ulnar styloid process 14.5  Across hand at thumb web space 18.3  At base of 2nd digit 5.8  (Blank rows = not tested)      LLIS 43%  BREAST COMPLAINTS QUESTIONNAIRE Pain  4 Heaviness:5 Swollen feeling:5 Tense  Skin:3 Redness:0 Bra Print:5 Size of Pores:0 Hard feeling: 0 Total:   22  /80 A Score over 9 indicates lymphedema issues in the breast  TODAY'S TREATMENT:  DATE:  Pt permission and consent throughout each step of examination and treatment with modification and draping if requested when working on sensitive areas  08/22/2022 Attempted supine wand for ROM but held due to impingement symptoms Gr 3/4 Left shoulder GH mobs posterior and inferior, SL scapular mobs pro/retraction, elevation,depression MLD: In Supine: Short neck, superficial and deep abdominals, Lt inguinal nodes, Lt axillo-inguinal anastomosis, Rt axillary and pectoral nodes, Rt intact upper quadrant sequence, anterior inter-axillary anastomosis, then Lt breast reviewing with pt while performing Scar mobilization MFR to left axilla ,Upper arm and forearm with arm resting on pillow to address cording  08/18/2022 MFR to Lt axilla and also to forearm where cording is noted and tightness is felt. Pts arm in scaption resting on pillow and also at 90 degrees abduction. Pt also applied pressure into axillary region while therapist performed release techniques to increase stretch on cords. Longitudinal and S technique utilized. PROM to left shoulder flexion, scaption, abduction, with pinning at axilla with no impingement felt. In supine: MLD to left UE :Short neck,  R axillary nodes and establishment of interaxillary pathway, L inguinal nodes and establishment of axilloinguinal pathway, then L UE working proximal to distal, moving fluid from upper inner arm outwards, and doing both sides of forearm moving fluid towards pathways then retracing all steps . Pt given handout for intact sequence and new handout for superficial abdominals  08/04/2022  MFR to Lt axilla and also to forearm where cording is noted and  tightness is felt. Pts arm in scaption resting on pillow. Pt also applied pressure into axillary region several times to increase stretch. Longitudinal and S technique utilized. Also tried with pts arm into more flexion but discontinued due to impingement  MLD: In Supine: Short neck, superficial and deep abdominals, Lt inguinal nodes, Lt axillo-inguinal anastomosis, Rt axillary and pectoral nodes, Rt intact upper quadrant sequence, anterior inter-axillary anastomosis, then Lt breast reviewing with pt while performing. Pt practiced abdominal techniques after therapist demonstrated. Pt given superficial and deep abdominal handout.    07/29/22: Self Care Explained anatomy of lymphatic system to pt and basic principles of MLD and how the abdominal sequences we normally do could help alleviate the intermittent abdominal swelling but it might also be helpful to get some well fitting, high waisted biker shorts to help with compression against the abdomen prn. She could also use her swell spot with this intermittently as well.  Manual Therapy MLD: In Supine: Short neck, superficial and deep abdominals, Lt inguinal nodes, Lt axillo-inguinal anastomosis, Rt axillary and pectoral nodes, Rt intact upper quadrant sequence, anterior inter-axillary anastomosis, then Lt breast reviewing with pt while performing P/ROM to Lt shoulder into flexion, abd and D2 to pts end available ROM; neural tension stretch as well MFR to Lt axilla during P/ROM and also to forearm where cording palpable  07/23/22:evaluation of breast, left UE, abdominal region and discussion of plan of care. Pt advised to wear compression bra for breast swelling, and to start wearing her sleeve to assist with cording.  PATIENT EDUCATION:  Education details: Superficial and deep abdominals Person educated: Patient Education method: Explanation, demonstration, handout issued (drawn by therapist) Education comprehension: verbalized understanding,  returned demo and will benefit from further review  HOME EXERCISE PROGRAM: Superficial and deep abdominals, handout issued   ASSESSMENT:  CLINICAL IMPRESSION: Performed GH and scapular mobilizations to improve joint mobility and help with impingement symptoms on left. Continued MFR techniques to left UE cording. Strong sensation felt with pressure over cord  at incisonal area and mid upper arm. Ended with left breast MLD including intact sequence. OBJECTIVE IMPAIRMENTS: decreased knowledge of condition, decreased ROM, hypomobility, increased edema, impaired UE functional use, and pain.   ACTIVITY LIMITATIONS: sleeping, bed mobility, and reach over head  PARTICIPATION LIMITATIONS:  social activities  PERSONAL FACTORS: 1-2 comorbidities: Breast and Left axillary surgery and recent abdominal surgery  are also affecting patient's functional outcome.   REHAB POTENTIAL: Excellent  CLINICAL DECISION MAKING: Stable/uncomplicated  EVALUATION COMPLEXITY: Low  GOALS: Goals reviewed with patient? Yes  SHORT TERM GOALS: Target date: 08/13/2023  Pt will be independent in a HEP for  ROM,NTS, stabilization to improve left shoulder ROM Baseline: Goal status: INITIAL  2.  Pt will be independent in abdominal treatment to reduce swelling Baseline:  Goal status: INITIAL  3.  Pt will have appropriate compression garments for abdominal swelling prn Baseline:  Goal status: MET 08/18/2022 4.  Pt will have decreased abdominal pain at night no greater than 3/10 Baseline:  Goal status: ongoing 5.  Pt will have left shoulder Flex and abd improved by atleast 15 degrees Baseline: 134 flex, 137 abd Goal status: INITIAL  6.  Pt will have decreased pain/median nerve symptoms in left UE to no greater than 2/10 Baseline:  Goal status: INITIAL  LONG TERM GOALS: Target date: 09/03/2023  Pt will have Left shoulder ROM within 7 degrees of right shoulder ROM Baseline: 160 flex, 175 abd Goal status:  INITIAL  2.  Pt will have abdominal pain no greater than 1/10 at night to allow better sleep Baseline:  Goal status: INITIAL  3.  Median nerve symptoms in left UE will no longer be present Baseline:  Goal status: INITIAL  4.  Pt will be independent in self management of Left breast , and abdominal swelling, and shoulder stretching/strengthening Baseline:  Goal status: INITIAL     PLAN:  PT FREQUENCY: 2x/week  PT DURATION: 6 weeks  PLANNED INTERVENTIONS: Therapeutic exercises, Therapeutic activity, Neuromuscular re-education, Patient/Family education, Self Care, Joint mobilization, Orthotic/Fit training, Dry Needling, Manual lymph drainage, scar mobilization, Vasopneumatic device, Manual therapy, and Re-evaluation  PLAN FOR NEXT SESSION: Cont abdominal treatment (superficial and deep abdominals)Review page with redone numbers,review intact sequence,  Review left breast MLD, MFR to cording, scap. Mobility and stabs. Pt has a Flexi touch at home and uses it, check compression sleeves   Waynette Buttery, PT 08/22/2022, 12:11 PM

## 2022-08-25 ENCOUNTER — Ambulatory Visit: Payer: Federal, State, Local not specified - PPO

## 2022-08-25 DIAGNOSIS — M25612 Stiffness of left shoulder, not elsewhere classified: Secondary | ICD-10-CM | POA: Diagnosis not present

## 2022-08-25 DIAGNOSIS — F4320 Adjustment disorder, unspecified: Secondary | ICD-10-CM | POA: Diagnosis not present

## 2022-08-25 DIAGNOSIS — R600 Localized edema: Secondary | ICD-10-CM | POA: Diagnosis not present

## 2022-08-25 DIAGNOSIS — I890 Lymphedema, not elsewhere classified: Secondary | ICD-10-CM | POA: Diagnosis not present

## 2022-08-25 DIAGNOSIS — Z483 Aftercare following surgery for neoplasm: Secondary | ICD-10-CM | POA: Diagnosis not present

## 2022-08-25 NOTE — Therapy (Signed)
OUTPATIENT PHYSICAL THERAPY ONCOLOGY TREATMENT  Patient Name: Sheena Jarvis MRN: [id number] DOB:12-30-1970, 52 y.o., female Today's Date: 08/25/2022  END OF SESSION:  PT End of Session - 08/25/22 1503     Visit Number 6    Number of Visits 12    Date for PT Re-Evaluation 09/03/22    PT Start Time 1506    PT Stop Time 1556    PT Time Calculation (min) 50 min    Activity Tolerance Patient tolerated treatment well    Behavior During Therapy [facility] for tasks assessed/performed             Past Medical History:  Diagnosis Date   Pneumonia    PONV (postoperative nausea and vomiting)    Vision abnormalities    Past Surgical History:  Procedure Laterality Date   APPENDECTOMY     BREAST BIOPSY Left 01/12/2019   Pash   BREAST BIOPSY Left 12/21/2018   benign   BREAST CYST ASPIRATION Left    BREAST EXCISIONAL BIOPSY Left 06/22/2019   Axilla BX - Benign   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   HIP ARTHROSCOPY     MASS EXCISION Left 02/10/2019   Procedure: EXCISION OF LEFT BREAST MASS;  Surgeon: Almond Lint, MD;  Location: [facility];  Service: General;  Laterality: Left;   SCAR REVISION Left 06/22/2019   Procedure: LEFT AXILLARY CONTRACTURE RELEASE;  Surgeon: Almond Lint, MD;  Location: [facility];  Service: General;  Laterality: Left;   Patient Active Problem List   Diagnosis Date Noted   Numbness 02/04/2016   Cognitive changes 02/04/2016   Other fatigue 02/04/2016     REFERRING PROVIDER: Remi Deter MD  REFERRING DIAG: Lymphedema post Endometrial surgery  THERAPY DIAG:  Lymphedema, not elsewhere classified  Aftercare following surgery for neoplasm  Localized edema  ONSET DATE: Mar 04, 2022  Rationale for Evaluation and Treatment: Rehabilitation  SUBJECTIVE:                                                                                                                                                                                            SUBJECTIVE STATEMENT: Today I woke up and feel really good. I am not wearing any compression and it doesn't hurt. Maybe a .5 today. I did some Wt training yesterday without my compression and I didn't feel it.  I still feel a little weirdness in the abdomen, but it isn't really bothering me. I have been able to wear different pants. With doing deltoid raises with wt it felt like a seering burning pain in the left breast, but  90 degrees is where I feel the cording. I only did 1 set. I felt good after last visit.I only have 1 visit this week due to [place].   PERTINENT HISTORY:  Pt had recent surgery for Stage II Endometriosis on Oct 31,2024. She had a prior history of removal of benign breast mass on 02/10/2019 with complication of seroma and clogged drain and possible infection. She also developed lymphedema in her left breast and axillary cording. She receieved extensive PT with MLD, compression, manual therapy and dry needling, but had a significant persistent cord that was painful and limited shoulder ROM She underwent and incision of that contracted scar tissue on 06/22/2019 that included one node and develped a seroma after that.  PAIN:  Are you having pain? 1- 3/10 and  just achy along the cord and left breast with reaching  PRECAUTIONS: Left UE lymphedema risk, abdominal swelling from Endometrial surgery/hysterectomy 03/05/2023  WEIGHT BEARING RESTRICTIONS: No  FALLS:  Has patient fallen in last 6 months? 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, integrative lifestyle medicine, pelvic floor, CE  LEISURE: yoga, anything outside, reading  HAND DOMINANCE: left   PRIOR LEVEL OF FUNCTION: Independent  PATIENT GOALS: differential dx, abdominal compression,cording release,   OBJECTIVE:  COGNITION: Overall cognitive status: Within functional limits for  tasks assessed   PALPATION: Cording noted left axillary region running to incision, and upper arm  OBSERVATIONS / OTHER ASSESSMENTS: generalized left breast swelling;no enlarged pores or fibrotic areas noted, limitations in left shoulder ROM and cording noted left axillary region into upper arm. Release techniques produce tingling in median nerve distribution of hand. Decreased left scapular mobility. No visible abdominal swelling this am, but pt notices fullness that becomes pitting later in the day in the central abdomen. No rebound tenderness today noted in any bilateral upper or lower quarters of abdomen. 3 well healed abdominal incisions.  SENSATION: Light touch: Appears intact    POSTURE: WNL  UPPER EXTREMITY AROM/PROM:  A/PROM RIGHT   eval   Shoulder extension   Shoulder flexion 160  Shoulder abduction 175  Shoulder internal rotation 50  Shoulder external rotation 115    (Blank rows = not tested)  A/PROM LEFT   eval  Shoulder extension   Shoulder flexion 134  Shoulder abduction 137  Shoulder internal rotation 43  Shoulder external rotation 100    (Blank rows = not tested)       UPPER EXTREMITY STRENGTH: NT    LYMPHEDEMA ASSESSMENTS:   SURGERY TYPE/DATE: 02/10/2019 Removal of Benign Breast Mass, 06/22/2019 Excision of contracted scar , Endometriosis/hysterectomy surgery 03/04/2022  NUMBER OF LYMPH NODES REMOVED: 1 left axillary with excision of contracted scar  CHEMOTHERAPY: NO  RADIATION:NO  HORMONE TREATMENT: NO  INFECTIONS: YES (multiple seromas after breast surgery)  LYMPHEDEMA ASSESSMENTS:   LANDMARK RIGHT  eval  10 cm proximal to olecranon process 23.2  Olecranon process 21.7  10 cm proximal to ulnar styloid process 19.4  Just proximal to ulnar styloid process 14.6  Across hand at thumb web space 18.4  At base of 2nd digit 5.8  (Blank rows = not tested)  LANDMARK LEFT  eval  10 cm proximal to olecranon process 23.1  Olecranon process  21.6  10 cm proximal to ulnar styloid process 18.4  Just proximal to ulnar styloid process 14.5  Across hand at thumb web space 18.3  At base of 2nd  digit 5.8  (Blank rows = not tested)      LLIS 43%  BREAST COMPLAINTS QUESTIONNAIRE Pain  4 Heaviness:5 Swollen feeling:5 Tense Skin:3 Redness:0 Bra Print:5 Size of Pores:0 Hard feeling: 0 Total:   22  /80 A Score over 9 indicates lymphedema issues in the breast  TODAY'S TREATMENT:                                                                                                                                         DATE:  Pt permission and consent throughout each step of examination and treatment with modification and draping if requested when working on sensitive areas   08/25/2022 Gr 3/4 Left shoulder GH mobs posterior and inferior, Scar mobilization left axilla MFR to left axilla ,Upper arm and forearm with arm resting on pillow to address cording at 90 and 120 degrees with longitudinal and S technique MLD: In Supine to left UE after cording release: Short neck, superficial and deep abdominals, Lt inguinal nodes, Lt axillo-inguinal anastomosis, Rt axillary LN and right interaxillary pathway and Left UE; outer, inner to outer upper arm, and outer arm to shoulder and repeating pathways then left forearm both sides repeating pathway and ending with LN's Pt given script for new compression sleeve and will go to a special place. 08/22/2022 Attempted supine wand for ROM but held due to impingement symptoms Gr 3/4 Left shoulder GH mobs posterior and inferior, SL scapular mobs pro/retraction, elevation,depression MLD: In Supine: Short neck, superficial and deep abdominals, Lt inguinal nodes, Lt axillo-inguinal anastomosis, Rt axillary and pectoral nodes, Rt intact upper quadrant sequence, anterior inter-axillary anastomosis, then Lt breast reviewing with pt while performing Scar mobilization MFR to left axilla ,Upper arm and forearm  with arm resting on pillow to address cording  08/18/2022 MFR to Lt axilla and also to forearm where cording is noted and tightness is felt. Pts arm in scaption resting on pillow and also at 90 degrees abduction. Pt also applied pressure into axillary region while therapist performed release techniques to increase stretch on cords. Longitudinal and S technique utilized. PROM to left shoulder flexion, scaption, abduction, with pinning at axilla with no impingement felt. In supine: MLD to left UE :Short neck,  R axillary nodes and establishment of interaxillary pathway, L inguinal nodes and establishment of axilloinguinal pathway, then L UE working proximal to distal, moving fluid from upper inner arm outwards, and doing both sides of forearm moving fluid towards pathways then retracing all steps . Pt given handout for intact sequence and new handout for superficial abdominals  08/04/2022  MFR to Lt axilla and also to forearm where cording is noted and tightness is felt. Pts arm in scaption resting on pillow. Pt also applied pressure into axillary region several times to increase stretch. Longitudinal and S technique utilized. Also tried with pts arm into more flexion but discontinued due to impingement  MLD:  In Supine: Short neck, superficial and deep abdominals, Lt inguinal nodes, Lt axillo-inguinal anastomosis, Rt axillary and pectoral nodes, Rt intact upper quadrant sequence, anterior inter-axillary anastomosis, then Lt breast reviewing with pt while performing. Pt practiced abdominal techniques after therapist demonstrated. Pt given superficial and deep abdominal handout.    07/29/22: Self Care Explained anatomy of lymphatic system to pt and basic principles of MLD and how the abdominal sequences we normally do could help alleviate the intermittent abdominal swelling but it might also be helpful to get some well fitting, high waisted biker shorts to help with compression against the abdomen prn. She  could also use her swell spot with this intermittently as well.  Manual Therapy MLD: In Supine: Short neck, superficial and deep abdominals, Lt inguinal nodes, Lt axillo-inguinal anastomosis, Rt axillary and pectoral nodes, Rt intact upper quadrant sequence, anterior inter-axillary anastomosis, then Lt breast reviewing with pt while performing P/ROM to Lt shoulder into flexion, abd and D2 to pts end available ROM; neural tension stretch as well MFR to Lt axilla during P/ROM and also to forearm where cording palpable  07/23/22:evaluation of breast, left UE, abdominal region and discussion of plan of care. Pt advised to wear compression bra for breast swelling, and to start wearing her sleeve to assist with cording.  PATIENT EDUCATION:  Education details: Superficial and deep abdominals Person educated: Patient Education method: Explanation, demonstration, handout issued (drawn by therapist) Education comprehension: verbalized understanding, returned demo and will benefit from further review  HOME EXERCISE PROGRAM: Superficial and deep abdominals, handout issued   ASSESSMENT:  CLINICAL IMPRESSION: Pt has achieved STG for abdominal pain less than 3/10 and pt independent with abdominal treatment. She has also been able to wear different paints without increased pain. She has gone for a day or 2 without compression and is feeling really good. Cording is still present and limiting, and pt continues with impingement symptoms in the left shoulder  OBJECTIVE IMPAIRMENTS: decreased knowledge of condition, decreased ROM, hypomobility, increased edema, impaired UE functional use, and pain.   ACTIVITY LIMITATIONS: sleeping, bed mobility, and reach over head  PARTICIPATION LIMITATIONS:  social activities  PERSONAL FACTORS: 1-2 comorbidities: Breast and Left axillary surgery and recent abdominal surgery  are also affecting patient's functional outcome.   REHAB POTENTIAL: Excellent  CLINICAL DECISION  MAKING: Stable/uncomplicated  EVALUATION COMPLEXITY: Low  GOALS: Goals reviewed with patient? Yes  SHORT TERM GOALS: Target date: 08/13/2023  Pt will be independent in a HEP for  ROM,NTS, stabilization to improve left shoulder ROM Baseline: Goal status: INITIAL  2.  Pt will be independent in abdominal treatment to reduce swelling Baseline:  Goal status: MET 3.  Pt will have appropriate compression garments for abdominal swelling prn Baseline:  Goal status: MET 08/18/2022 4.  Pt will have decreased abdominal pain at night no greater than 3/10 Baseline:  Goal status: MET 08/25/2022  5.  Pt will have left shoulder Flex and abd improved by atleast 15 degrees Baseline: 134 flex, 137 abd Goal status: INITIAL  6.  Pt will have decreased pain/median nerve symptoms in left UE to no greater than 2/10 Baseline:  Goal status: INITIAL  LONG TERM GOALS: Target date: 09/03/2023  Pt will have Left shoulder ROM within 7 degrees of right shoulder ROM Baseline: 160 flex, 175 abd Goal status: INITIAL  2.  Pt will have abdominal pain no greater than 1/10 at night to allow better sleep Baseline:  Goal status: INITIAL  3.  Median nerve symptoms in left  UE will no longer be present Baseline:  Goal status: INITIAL  4.  Pt will be independent in self management of Left breast , and abdominal swelling, and shoulder stretching/strengthening Baseline:  Goal status: INITIAL     PLAN:  PT FREQUENCY: 2x/week  PT DURATION: 6 weeks  PLANNED INTERVENTIONS: Therapeutic exercises, Therapeutic activity, Neuromuscular re-education, Patient/Family education, Self Care, Joint mobilization, Orthotic/Fit training, Dry Needling, Manual lymph drainage, scar mobilization, Vasopneumatic device, Manual therapy, and Re-evaluation  PLAN FOR NEXT SESSION: Cont abdominal treatment (superficial and deep abdominals)Review page with redone numbers,review intact sequence,  Review left breast MLD, MFR to cording, GH  ,scap. Mobility and stabs. Pt has a Flexi touch at home and uses it, check compression sleeves: gave pt script to purchase new sleeves. Assess ROM   Waynette Buttery, PT 08/25/2022, 4:04 PM

## 2022-08-26 DIAGNOSIS — F4320 Adjustment disorder, unspecified: Secondary | ICD-10-CM | POA: Diagnosis not present

## 2022-09-01 DIAGNOSIS — F4320 Adjustment disorder, unspecified: Secondary | ICD-10-CM | POA: Diagnosis not present

## 2022-09-05 ENCOUNTER — Ambulatory Visit: Payer: Federal, State, Local not specified - PPO | Attending: Obstetrics and Gynecology

## 2022-09-05 DIAGNOSIS — I890 Lymphedema, not elsewhere classified: Secondary | ICD-10-CM | POA: Insufficient documentation

## 2022-09-05 DIAGNOSIS — R600 Localized edema: Secondary | ICD-10-CM | POA: Insufficient documentation

## 2022-09-05 DIAGNOSIS — M25612 Stiffness of left shoulder, not elsewhere classified: Secondary | ICD-10-CM | POA: Diagnosis not present

## 2022-09-05 DIAGNOSIS — Z483 Aftercare following surgery for neoplasm: Secondary | ICD-10-CM | POA: Diagnosis not present

## 2022-09-05 NOTE — Therapy (Signed)
OUTPATIENT PHYSICAL THERAPY ONCOLOGY TREATMENT  Patient Name: Sheena Jarvis MRN: [id number] DOB:06/28/1970, 52 y.o., female Today's Date: 09/05/2022  END OF SESSION:  PT End of Session - 09/05/22 0800     Visit Number 7    Number of Visits 23    Date for PT Re-Evaluation 10/31/22    PT Start Time 0802    PT Stop Time 0849    PT Time Calculation (min) 47 min    Activity Tolerance Patient tolerated treatment well    Behavior During Therapy [facility] for tasks assessed/performed             Past Medical History:  Diagnosis Date   Pneumonia    PONV (postoperative nausea and vomiting)    Vision abnormalities    Past Surgical History:  Procedure Laterality Date   APPENDECTOMY     BREAST BIOPSY Left 01/12/2019   Pash   BREAST BIOPSY Left 12/21/2018   benign   BREAST CYST ASPIRATION Left    BREAST EXCISIONAL BIOPSY Left 06/22/2019   Axilla BX - Benign   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   HIP ARTHROSCOPY     MASS EXCISION Left 02/10/2019   Procedure: EXCISION OF LEFT BREAST MASS;  Surgeon: Almond Lint, MD;  Location: [facility];  Service: General;  Laterality: Left;   SCAR REVISION Left 06/22/2019   Procedure: LEFT AXILLARY CONTRACTURE RELEASE;  Surgeon: Almond Lint, MD;  Location: [facility];  Service: General;  Laterality: Left;   Patient Active Problem List   Diagnosis Date Noted   Numbness 02/04/2016   Cognitive changes 02/04/2016   Other fatigue 02/04/2016     REFERRING PROVIDER: Remi Deter MD  REFERRING DIAG: Lymphedema post Endometrial surgery  THERAPY DIAG:  Lymphedema, not elsewhere classified  Aftercare following surgery for neoplasm  Localized edema  Stiffness of left shoulder, not elsewhere classified  ONSET DATE: Mar 04, 2022  Rationale for Evaluation and Treatment: Rehabilitation  SUBJECTIVE:                                                                                                                                                                                            SUBJECTIVE STATEMENT: I am not waking up at night and sleep is not disturbed. Symptoms in the arm are less, but I can still feel the nerve symptoms in the arm and breast. If I wear compression sometimes it goes away entirely. I still feel like I get some abdominal swelling, but I can wear most of my pants again. I feel like the neural tension makes my breast feel swollen. I  still have problems with left shoulder impingement.   PERTINENT HISTORY:  Pt had recent surgery for Stage II Endometriosis on Oct 31,2024. She had a prior history of removal of benign breast mass on 02/10/2019 with complication of seroma and clogged drain and possible infection. She also developed lymphedema in her left breast and axillary cording. She receieved extensive PT with MLD, compression, manual therapy and dry needling, but had a significant persistent cord that was painful and limited shoulder ROM She underwent and incision of that contracted scar tissue on 06/22/2019 that included one node and develped a seroma after that.  PAIN:  Are you having pain? 2/10 and  just achy along the cord, axilla and left breast with reaching  PRECAUTIONS: Left UE lymphedema risk, abdominal swelling from Endometrial surgery/hysterectomy 03/05/2023  WEIGHT BEARING RESTRICTIONS: No  FALLS:  Has patient fallen in last 6 months? 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, integrative lifestyle medicine, pelvic floor, CE  LEISURE: yoga, anything outside, reading  HAND DOMINANCE: left   PRIOR LEVEL OF FUNCTION: Independent  PATIENT GOALS: differential dx, abdominal compression,cording release,   OBJECTIVE:  COGNITION: Overall cognitive status: Within functional limits for tasks assessed   PALPATION: Cording noted left axillary region running to  incision, and upper arm  OBSERVATIONS / OTHER ASSESSMENTS: generalized left breast swelling;no enlarged pores or fibrotic areas noted, limitations in left shoulder ROM and cording noted left axillary region into upper arm. Release techniques produce tingling in median nerve distribution of hand. Decreased left scapular mobility. No visible abdominal swelling this am, but pt notices fullness that becomes pitting later in the day in the central abdomen. No rebound tenderness today noted in any bilateral upper or lower quarters of abdomen. 3 well healed abdominal incisions.  SENSATION: Light touch: Appears intact    POSTURE: WNL  UPPER EXTREMITY AROM/PROM:  A/PROM RIGHT   eval   Shoulder extension   Shoulder flexion 160  Shoulder abduction 175  Shoulder internal rotation 50  Shoulder external rotation 115    (Blank rows = not tested)  A/PROM LEFT   eval LEFT 09/05/2022  Shoulder extension    Shoulder flexion 134 158  Shoulder abduction 137 134 pain  Shoulder internal rotation 43   Shoulder external rotation 100     (Blank rows = not tested)       UPPER EXTREMITY STRENGTH: NT    LYMPHEDEMA ASSESSMENTS:   SURGERY TYPE/DATE: 02/10/2019 Removal of Benign Breast Mass, 06/22/2019 Excision of contracted scar , Endometriosis/hysterectomy surgery 03/04/2022  NUMBER OF LYMPH NODES REMOVED: 1 left axillary with excision of contracted scar  CHEMOTHERAPY: NO  RADIATION:NO  HORMONE TREATMENT: NO  INFECTIONS: YES (multiple seromas after breast surgery)  LYMPHEDEMA ASSESSMENTS:   LANDMARK RIGHT  eval  10 cm proximal to olecranon process 23.2  Olecranon process 21.7  10 cm proximal to ulnar styloid process 19.4  Just proximal to ulnar styloid process 14.6  Across hand at thumb web space 18.4  At base of 2nd digit 5.8  (Blank rows = not tested)  LANDMARK LEFT  eval  10 cm proximal to olecranon process 23.1  Olecranon process 21.6  10 cm proximal to ulnar styloid  process 18.4  Just proximal to ulnar styloid process 14.5  Across hand at thumb web space 18.3  At base of 2nd digit 5.8  (Blank rows = not tested)      LLIS  43%  BREAST COMPLAINTS QUESTIONNAIRE Pain  4 Heaviness:5 Swollen feeling:5 Tense Skin:3 Redness:0 Bra Print:5 Size of Pores:0 Hard feeling: 0 Total:   22  /80 A Score over 9 indicates lymphedema issues in the breast  TODAY'S TREATMENT:                                                                                                                                         DATE:  Pt permission and consent throughout each step of examination and treatment with modification and draping if requested when working on sensitive areas 09/05/2022 Gr 3/4 Left shoulder GH mobs posterior and inferior, Scar mobilization left axilla Soft tissue mobilization left UT, lats, Teres, interscapular area with cupping to same MFR to left axilla ,Upper arm and forearm with arm resting on pillow to address cording at 90 and 120 degrees  Measured AROM   08/25/2022 Gr 3/4 Left shoulder GH mobs posterior and inferior, Scar mobilization left axilla MFR to left axilla ,Upper arm and forearm with arm resting on pillow to address cording at 90 and 120 degrees with longitudinal and S technique MLD: In Supine to left UE after cording release: Short neck, superficial and deep abdominals, Lt inguinal nodes, Lt axillo-inguinal anastomosis, Rt axillary LN and right interaxillary pathway and Left UE; outer, inner to outer upper arm, and outer arm to shoulder and repeating pathways then left forearm both sides repeating pathway and ending with LN's Pt given script for new compression sleeve and will go to a special place. 08/22/2022 Attempted supine wand for ROM but held due to impingement symptoms Gr 3/4 Left shoulder GH mobs posterior and inferior, SL scapular mobs pro/retraction, elevation,depression MLD: In Supine: Short neck, superficial and deep abdominals,  Lt inguinal nodes, Lt axillo-inguinal anastomosis, Rt axillary and pectoral nodes, Rt intact upper quadrant sequence, anterior inter-axillary anastomosis, then Lt breast reviewing with pt while performing Scar mobilization MFR to left axilla ,Upper arm and forearm with arm resting on pillow to address cording  08/18/2022 MFR to Lt axilla and also to forearm where cording is noted and tightness is felt. Pts arm in scaption resting on pillow and also at 90 degrees abduction. Pt also applied pressure into axillary region while therapist performed release techniques to increase stretch on cords. Longitudinal and S technique utilized. PROM to left shoulder flexion, scaption, abduction, with pinning at axilla with no impingement felt. In supine: MLD to left UE :Short neck,  R axillary nodes and establishment of interaxillary pathway, L inguinal nodes and establishment of axilloinguinal pathway, then L UE working proximal to distal, moving fluid from upper inner arm outwards, and doing both sides of forearm moving fluid towards pathways then retracing all steps . Pt given handout for intact sequence and new handout for superficial abdominals  08/04/2022  MFR to Lt axilla and also to forearm where cording is noted and tightness is felt. Pts arm  in scaption resting on pillow. Pt also applied pressure into axillary region several times to increase stretch. Longitudinal and S technique utilized. Also tried with pts arm into more flexion but discontinued due to impingement  MLD: In Supine: Short neck, superficial and deep abdominals, Lt inguinal nodes, Lt axillo-inguinal anastomosis, Rt axillary and pectoral nodes, Rt intact upper quadrant sequence, anterior inter-axillary anastomosis, then Lt breast reviewing with pt while performing. Pt practiced abdominal techniques after therapist demonstrated. Pt given superficial and deep abdominal handout.    07/29/22: Self Care Explained anatomy of lymphatic system to pt  and basic principles of MLD and how the abdominal sequences we normally do could help alleviate the intermittent abdominal swelling but it might also be helpful to get some well fitting, high waisted biker shorts to help with compression against the abdomen prn. She could also use her swell spot with this intermittently as well.  Manual Therapy MLD: In Supine: Short neck, superficial and deep abdominals, Lt inguinal nodes, Lt axillo-inguinal anastomosis, Rt axillary and pectoral nodes, Rt intact upper quadrant sequence, anterior inter-axillary anastomosis, then Lt breast reviewing with pt while performing P/ROM to Lt shoulder into flexion, abd and D2 to pts end available ROM; neural tension stretch as well MFR to Lt axilla during P/ROM and also to forearm where cording palpable  07/23/22:evaluation of breast, left UE, abdominal region and discussion of plan of care. Pt advised to wear compression bra for breast swelling, and to start wearing her sleeve to assist with cording.  PATIENT EDUCATION:  Education details: Superficial and deep abdominals Person educated: Patient Education method: Explanation, demonstration, handout issued (drawn by therapist) Education comprehension: verbalized understanding, returned demo and will benefit from further review  HOME EXERCISE PROGRAM: Superficial and deep abdominals, handout issued   ASSESSMENT:  CLINICAL IMPRESSION: Pt has met or partially met 5 of 6 STG's and 1/ 4 Long term goals set at evaluation. She is independent and compliant with a  HEP for ROM, NTS . AROM for Leftshoulder Flexion has improved nicely and is without pain, however abduction continues with limitations in ROM due to signs of impingement. She is still bothered by median nerve NTS, and cording in the left axillary region into the upper arm. Cording is not nearly as visible, but is palpable and limiting for the pt. She is now able to sleep through the night, and is not bothered nearly as  much with abdominal swelling which she manages with compression and manual abdominal treatments. She is able to wear most of her pants now comfortably. She will benefit from continued skilled therapy to review Intact MLD sequence for left breast, continue NTS,release techniques for cording and stabilization of left shoulder to improve motion and decrease signs of impingement.  OBJECTIVE IMPAIRMENTS: decreased knowledge of condition, decreased ROM, hypomobility, increased edema, impaired UE functional use, and pain.   ACTIVITY LIMITATIONS: sleeping, bed mobility, and reach over head  PARTICIPATION LIMITATIONS:  social activities  PERSONAL FACTORS: 1-2 comorbidities: Breast and Left axillary surgery and recent abdominal surgery  are also affecting patient's functional outcome.   REHAB POTENTIAL: Excellent  CLINICAL DECISION MAKING: Stable/uncomplicated  EVALUATION COMPLEXITY: Low  GOALS: Goals reviewed with patient? Yes  SHORT TERM GOALS: Target date: 08/13/2023  Pt will be independent in a HEP for  ROM,NTS, stabilization to improve left shoulder ROM Baseline: Goal status:MET  2.  Pt will be independent in abdominal treatment to reduce swelling Baseline:  Goal status: MET 3.  Pt will have appropriate compression garments  for abdominal swelling prn Baseline:  Goal status: MET 08/18/2022 4.  Pt will have decreased abdominal pain at night no greater than 3/10 Baseline:  Goal status: MET 08/25/2022  5.  Pt will have left shoulder Flex and abd improved by atleast 15 degrees Baseline: 134 flex, 137 abd Goal status: MET flexion, ongoing abduction due to impingement symptoms 6.  Pt will have decreased pain/median nerve symptoms in left UE to no greater than 2/10 Baseline:  Goal status: Ongoing LONG TERM GOALS: Target date: 09/03/2023/ 10/31/2022  Pt will have Left shoulder ROM within 7 degrees of right shoulder ROM Baseline: 160 flex, 175 abd Goal status: Ongoing 2.  Pt will have  abdominal pain no greater than 1/10 at night to allow better sleep Baseline: MET Goal status: 3.  Median nerve symptoms in left UE will no longer be present Baseline:  Goal status: improving,ONGOING 4.  Pt will be independent in self management of Left breast , and abdominal swelling, and shoulder stretching/strengthening Baseline:  Goal status:ONGOING    PLAN:  PT FREQUENCY: 1-2x/week  PT DURATION: 8 weeks PLANNED INTERVENTIONS: Therapeutic exercises, Therapeutic activity, Neuromuscular re-education, Patient/Family education, Self Care, Joint mobilization, Orthotic/Fit training, Dry Needling, Manual lymph drainage, scar mobilization, Vasopneumatic device, Manual therapy, and Re-evaluation  PLAN FOR NEXT SESSION: Cont abdominal treatment (superficial and deep abdominals)review intact sequence,  Review left breast MLD, MFR to cording, GH ,scap. Mobility and stabs. Pt has a Flexi touch at home and uses it, check compression sleeves: gave pt script to purchase new sleeves. Assess ROM   Waynette Buttery, PT 09/05/2022, 12:27 PM

## 2022-09-08 ENCOUNTER — Ambulatory Visit: Payer: Federal, State, Local not specified - PPO

## 2022-09-08 DIAGNOSIS — Z483 Aftercare following surgery for neoplasm: Secondary | ICD-10-CM | POA: Diagnosis not present

## 2022-09-08 DIAGNOSIS — I890 Lymphedema, not elsewhere classified: Secondary | ICD-10-CM

## 2022-09-08 DIAGNOSIS — M25612 Stiffness of left shoulder, not elsewhere classified: Secondary | ICD-10-CM | POA: Diagnosis not present

## 2022-09-08 DIAGNOSIS — F4320 Adjustment disorder, unspecified: Secondary | ICD-10-CM | POA: Diagnosis not present

## 2022-09-08 DIAGNOSIS — R600 Localized edema: Secondary | ICD-10-CM | POA: Diagnosis not present

## 2022-09-08 NOTE — Therapy (Addendum)
OUTPATIENT PHYSICAL THERAPY ONCOLOGY TREATMENT  Patient Name: Sheena Jarvis MRN: [id number] DOB:August 29, 1970, 52 y.o., female Today's Date: 09/08/2022  END OF SESSION:  PT End of Session - 09/08/22 1557     Visit Number 8    Number of Visits 23    Date for PT Re-Evaluation 10/31/22    PT Start Time 1600    PT Stop Time 1652    PT Time Calculation (min) 52 min    Activity Tolerance Patient tolerated treatment well    Behavior During Therapy WFL for tasks assessed/performed             Past Medical History:  Diagnosis Date   Pneumonia    PONV (postoperative nausea and vomiting)    Vision abnormalities    Past Surgical History:  Procedure Laterality Date   APPENDECTOMY     BREAST BIOPSY Left 01/12/2019   Pash   BREAST BIOPSY Left 12/21/2018   benign   BREAST CYST ASPIRATION Left    BREAST EXCISIONAL BIOPSY Left 06/22/2019   Axilla BX - Benign   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   HIP ARTHROSCOPY     MASS EXCISION Left 02/10/2019   Procedure: EXCISION OF LEFT BREAST MASS;  Surgeon: Almond Lint, MD;  Location: [facility];  Service: General;  Laterality: Left;   SCAR REVISION Left 06/22/2019   Procedure: LEFT AXILLARY CONTRACTURE RELEASE;  Surgeon: Almond Lint, MD;  Location: [facility];  Service: General;  Laterality: Left;   Patient Active Problem List   Diagnosis Date Noted   Numbness 02/04/2016   Cognitive changes 02/04/2016   Other fatigue 02/04/2016     REFERRING PROVIDER: Remi Deter MD  REFERRING DIAG: Lymphedema post Endometrial surgery  THERAPY DIAG:  Lymphedema, not elsewhere classified  Aftercare following surgery for neoplasm  Localized edema  Stiffness of left shoulder, not elsewhere classified  ONSET DATE: Mar 04, 2022  Rationale for Evaluation and Treatment: Rehabilitation  SUBJECTIVE:                                                                                                                                                                                            SUBJECTIVE STATEMENT:  I did well last time. It felt good to work in that area.  PERTINENT HISTORY:  Pt had recent surgery for Stage II Endometriosis on Oct 31,2024. She had a prior history of removal of benign breast mass on 02/10/2019 with complication of seroma and clogged drain and possible infection. She also developed lymphedema in her left breast and axillary cording. She receieved extensive PT with MLD, compression,  manual therapy and dry needling, but had a significant persistent cord that was painful and limited shoulder ROM She underwent and incision of that contracted scar tissue on 06/22/2019 that included one node and develped a seroma after that.  PAIN:  Are you having pain? 2/10 and  just achy along the cord, axilla and left breast with reaching  PRECAUTIONS: Left UE lymphedema risk, abdominal swelling from Endometrial surgery/hysterectomy 03/05/2023  WEIGHT BEARING RESTRICTIONS: No  FALLS:  Has patient fallen in last 6 months? 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, integrative lifestyle medicine, pelvic floor, CE  LEISURE: yoga, anything outside, reading  HAND DOMINANCE: left   PRIOR LEVEL OF FUNCTION: Independent  PATIENT GOALS: differential dx, abdominal compression,cording release,   OBJECTIVE:  COGNITION: Overall cognitive status: Within functional limits for tasks assessed   PALPATION: Cording noted left axillary region running to incision, and upper arm  OBSERVATIONS / OTHER ASSESSMENTS: generalized left breast swelling;no enlarged pores or fibrotic areas noted, limitations in left shoulder ROM and cording noted left axillary region into upper arm. Release techniques produce tingling in median nerve distribution of hand. Decreased left scapular mobility. No visible  abdominal swelling this am, but pt notices fullness that becomes pitting later in the day in the central abdomen. No rebound tenderness today noted in any bilateral upper or lower quarters of abdomen. 3 well healed abdominal incisions.  SENSATION: Light touch: Appears intact    POSTURE: WNL  UPPER EXTREMITY AROM/PROM:  A/PROM RIGHT   eval   Shoulder extension   Shoulder flexion 160  Shoulder abduction 175  Shoulder internal rotation 50  Shoulder external rotation 115    (Blank rows = not tested)  A/PROM LEFT   eval LEFT 09/05/2022  Shoulder extension    Shoulder flexion 134 158  Shoulder abduction 137 134 pain  Shoulder internal rotation 43   Shoulder external rotation 100     (Blank rows = not tested)       UPPER EXTREMITY STRENGTH: NT    LYMPHEDEMA ASSESSMENTS:   SURGERY TYPE/DATE: 02/10/2019 Removal of Benign Breast Mass, 06/22/2019 Excision of contracted scar , Endometriosis/hysterectomy surgery 03/04/2022  NUMBER OF LYMPH NODES REMOVED: 1 left axillary with excision of contracted scar  CHEMOTHERAPY: NO  RADIATION:NO  HORMONE TREATMENT: NO  INFECTIONS: YES (multiple seromas after breast surgery)  LYMPHEDEMA ASSESSMENTS:   LANDMARK RIGHT  eval  10 cm proximal to olecranon process 23.2  Olecranon process 21.7  10 cm proximal to ulnar styloid process 19.4  Just proximal to ulnar styloid process 14.6  Across hand at thumb web space 18.4  At base of 2nd digit 5.8  (Blank rows = not tested)  LANDMARK LEFT  eval  10 cm proximal to olecranon process 23.1  Olecranon process 21.6  10 cm proximal to ulnar styloid process 18.4  Just proximal to ulnar styloid process 14.5  Across hand at thumb web space 18.3  At base of 2nd digit 5.8  (Blank rows = not tested)      LLIS 43%  BREAST COMPLAINTS QUESTIONNAIRE Pain  4 Heaviness:5 Swollen feeling:5 Tense Skin:3 Redness:0 Bra Print:5 Size of Pores:0 Hard feeling: 0 Total:   22  /80 A Score over  9 indicates lymphedema issues in the breast  TODAY'S TREATMENT:  DATE:  Pt permission and consent throughout each step of examination and treatment with modification and draping if requested when working on sensitive areas  09/08/2022 Scar mobilization left axilla Soft tissue mobilization left UT, lats, Teres, interscapular area with cupping to same MFR to left axilla ,Upper arm and forearm with arm resting on pillow to address cording at 90 and 120 degrees abduction   09/05/2022 Gr 3/4 Left shoulder GH mobs posterior and inferior, Scar mobilization left axilla Soft tissue mobilization left UT, lats, Teres, interscapular area with cupping to same MFR to left axilla ,Upper arm and forearm with arm resting on pillow to address cording at 90 and 120 degrees abduction Measured AROM   08/25/2022 Gr 3/4 Left shoulder GH mobs posterior and inferior, Scar mobilization left axilla MFR to left axilla ,Upper arm and forearm with arm resting on pillow to address cording at 90 and 120 degrees with longitudinal and S technique MLD: In Supine to left UE after cording release: Short neck, superficial and deep abdominals, Lt inguinal nodes, Lt axillo-inguinal anastomosis, Rt axillary LN and right interaxillary pathway and Left UE; outer, inner to outer upper arm, and outer arm to shoulder and repeating pathways then left forearm both sides repeating pathway and ending with LN's Pt given script for new compression sleeve and will go to a special place. 08/22/2022 Attempted supine wand for ROM but held due to impingement symptoms Gr 3/4 Left shoulder GH mobs posterior and inferior, SL scapular mobs pro/retraction, elevation,depression MLD: In Supine: Short neck, superficial and deep abdominals, Lt inguinal nodes, Lt axillo-inguinal anastomosis, Rt axillary and pectoral nodes, Rt  intact upper quadrant sequence, anterior inter-axillary anastomosis, then Lt breast reviewing with pt while performing Scar mobilization MFR to left axilla ,Upper arm and forearm with arm resting on pillow to address cording  08/18/2022 MFR to Lt axilla and also to forearm where cording is noted and tightness is felt. Pts arm in scaption resting on pillow and also at 90 degrees abduction. Pt also applied pressure into axillary region while therapist performed release techniques to increase stretch on cords. Longitudinal and S technique utilized. PROM to left shoulder flexion, scaption, abduction, with pinning at axilla with no impingement felt. In supine: MLD to left UE :Short neck,  R axillary nodes and establishment of interaxillary pathway, L inguinal nodes and establishment of axilloinguinal pathway, then L UE working proximal to distal, moving fluid from upper inner arm outwards, and doing both sides of forearm moving fluid towards pathways then retracing all steps . Pt given handout for intact sequence and new handout for superficial abdominals  08/04/2022  MFR to Lt axilla and also to forearm where cording is noted and tightness is felt. Pts arm in scaption resting on pillow. Pt also applied pressure into axillary region several times to increase stretch. Longitudinal and S technique utilized. Also tried with pts arm into more flexion but discontinued due to impingement  MLD: In Supine: Short neck, superficial and deep abdominals, Lt inguinal nodes, Lt axillo-inguinal anastomosis, Rt axillary and pectoral nodes, Rt intact upper quadrant sequence, anterior inter-axillary anastomosis, then Lt breast reviewing with pt while performing. Pt practiced abdominal techniques after therapist demonstrated. Pt given superficial and deep abdominal handout.    07/29/22: Self Care Explained anatomy of lymphatic system to pt and basic principles of MLD and how the abdominal sequences we normally do could help  alleviate the intermittent abdominal swelling but it might also be helpful to get some well fitting, high waisted biker  shorts to help with compression against the abdomen prn. She could also use her swell spot with this intermittently as well.  Manual Therapy MLD: In Supine: Short neck, superficial and deep abdominals, Lt inguinal nodes, Lt axillo-inguinal anastomosis, Rt axillary and pectoral nodes, Rt intact upper quadrant sequence, anterior inter-axillary anastomosis, then Lt breast reviewing with pt while performing P/ROM to Lt shoulder into flexion, abd and D2 to pts end available ROM; neural tension stretch as well MFR to Lt axilla during P/ROM and also to forearm where cording palpable  07/23/22:evaluation of breast, left UE, abdominal region and discussion of plan of care. Pt advised to wear compression bra for breast swelling, and to start wearing her sleeve to assist with cording.  PATIENT EDUCATION:  Education details: Superficial and deep abdominals Person educated: Patient Education method: Explanation, demonstration, handout issued (drawn by therapist) Education comprehension: verbalized understanding, returned demo and will benefit from further review  HOME EXERCISE PROGRAM: Superficial and deep abdominals, handout issued   ASSESSMENT:  CLINICAL IMPRESSION: Continued soft tissue mobilization and cupping to Left scapular area, teres, lats, and MFR techniques to address left UE cording. Pt able to get more easily into proper position for stretching without impingement today. Cording still evident but not nearly as visible.  OBJECTIVE IMPAIRMENTS: decreased knowledge of condition, decreased ROM, hypomobility, increased edema, impaired UE functional use, and pain.   ACTIVITY LIMITATIONS: sleeping, bed mobility, and reach over head  PARTICIPATION LIMITATIONS:  social activities  PERSONAL FACTORS: 1-2 comorbidities: Breast and Left axillary surgery and recent abdominal surgery   are also affecting patient's functional outcome.   REHAB POTENTIAL: Excellent  CLINICAL DECISION MAKING: Stable/uncomplicated  EVALUATION COMPLEXITY: Low  GOALS: Goals reviewed with patient? Yes  SHORT TERM GOALS: Target date: 08/13/2023  Pt will be independent in a HEP for  ROM,NTS, stabilization to improve left shoulder ROM Baseline: Goal status:MET  2.  Pt will be independent in abdominal treatment to reduce swelling Baseline:  Goal status: MET 3.  Pt will have appropriate compression garments for abdominal swelling prn Baseline:  Goal status: MET 08/18/2022 4.  Pt will have decreased abdominal pain at night no greater than 3/10 Baseline:  Goal status: MET 08/25/2022  5.  Pt will have left shoulder Flex and abd improved by atleast 15 degrees Baseline: 134 flex, 137 abd Goal status: MET flexion, ongoing abduction due to impingement symptoms 6.  Pt will have decreased pain/median nerve symptoms in left UE to no greater than 2/10 Baseline:  Goal status: Ongoing LONG TERM GOALS: Target date: 09/03/2023/ 10/31/2022  Pt will have Left shoulder ROM within 7 degrees of right shoulder ROM Baseline: 160 flex, 175 abd Goal status: Ongoing 2.  Pt will have abdominal pain no greater than 1/10 at night to allow better sleep Baseline: MET Goal status: 3.  Median nerve symptoms in left UE will no longer be present Baseline:  Goal status: improving,ONGOING 4.  Pt will be independent in self management of Left breast , and abdominal swelling, and shoulder stretching/strengthening Baseline:  Goal status:ONGOING    PLAN:  PT FREQUENCY: 1-2x/week  PT DURATION: 8 weeks PLANNED INTERVENTIONS: Therapeutic exercises, Therapeutic activity, Neuromuscular re-education, Patient/Family education, Self Care, Joint mobilization, Orthotic/Fit training, Dry Needling, Manual lymph drainage, scar mobilization, Vasopneumatic device, Manual therapy, and Re-evaluation  PLAN FOR NEXT SESSION: Cont  abdominal treatment (superficial and deep abdominals)review intact sequence,  Review left breast MLD, MFR to cording, GH ,scap. Mobility and stabs. Pt has a Flexi touch at home and  uses it, check compression sleeves: gave pt script to purchase new sleeves. Assess ROM   Waynette Buttery, PT 09/08/2022, 4:59 PM

## 2022-09-15 DIAGNOSIS — F4320 Adjustment disorder, unspecified: Secondary | ICD-10-CM | POA: Diagnosis not present

## 2022-09-19 ENCOUNTER — Ambulatory Visit: Payer: Federal, State, Local not specified - PPO

## 2022-09-19 DIAGNOSIS — Z483 Aftercare following surgery for neoplasm: Secondary | ICD-10-CM | POA: Diagnosis not present

## 2022-09-19 DIAGNOSIS — R600 Localized edema: Secondary | ICD-10-CM | POA: Diagnosis not present

## 2022-09-19 DIAGNOSIS — I890 Lymphedema, not elsewhere classified: Secondary | ICD-10-CM

## 2022-09-19 DIAGNOSIS — M25612 Stiffness of left shoulder, not elsewhere classified: Secondary | ICD-10-CM

## 2022-09-19 NOTE — Therapy (Signed)
OUTPATIENT PHYSICAL THERAPY ONCOLOGY TREATMENT  Patient Name: Sheena Jarvis MRN: [id number] DOB:02-24-1971, 52 y.o., female Today's Date: 09/19/2022  END OF SESSION:  PT End of Session - 09/19/22 0901     Visit Number 9    Number of Visits 23    Date for PT Re-Evaluation 10/31/22    PT Start Time 0902    PT Stop Time 0951    PT Time Calculation (min) 49 min    Activity Tolerance Patient tolerated treatment well    Behavior During Therapy [facility] for tasks assessed/performed             Past Medical History:  Diagnosis Date   Pneumonia    PONV (postoperative nausea and vomiting)    Vision abnormalities    Past Surgical History:  Procedure Laterality Date   APPENDECTOMY     BREAST BIOPSY Left 01/12/2019   Pash   BREAST BIOPSY Left 12/21/2018   benign   BREAST CYST ASPIRATION Left    BREAST EXCISIONAL BIOPSY Left 06/22/2019   Axilla BX - Benign   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   HIP ARTHROSCOPY     MASS EXCISION Left 02/10/2019   Procedure: EXCISION OF LEFT BREAST MASS;  Surgeon: Almond Lint, MD;  Location: [facility];  Service: General;  Laterality: Left;   SCAR REVISION Left 06/22/2019   Procedure: LEFT AXILLARY CONTRACTURE RELEASE;  Surgeon: Almond Lint, MD;  Location:  S[facility]ERY [facility];  Service: General;  Laterality: Left;   Patient Active Problem List   Diagnosis Date Noted   Numbness 02/04/2016   Cognitive changes 02/04/2016   Other fatigue 02/04/2016     REFERRING PROVIDER: Remi Deter MD  REFERRING DIAG: Lymphedema post Endometrial surgery  THERAPY DIAG:  Lymphedema, not elsewhere classified  Aftercare following surgery for neoplasm  Localized edema  Stiffness of left shoulder, not elsewhere classified  ONSET DATE: Mar 04, 2022  Rationale for Evaluation and Treatment: Rehabilitation  SUBJECTIVE:                                                                                                                                                                                            SUBJECTIVE STATEMENT: I wear my compression bra most days and then keeps things down. Not sure how cording is;I haven't had to reach much to test it. Abdominal swelling is doing well.   PERTINENT HISTORY:  Pt had recent surgery for Stage II Endometriosis on Oct 31,2024. She had a prior history of removal of benign breast mass on 02/10/2019 with complication of seroma and clogged drain and possible infection. She  also developed lymphedema in her left breast and axillary cording. She receieved extensive PT with MLD, compression, manual therapy and dry needling, but had a significant persistent cord that was painful and limited shoulder ROM She underwent and incision of that contracted scar tissue on 06/22/2019 that included one node and develped a seroma after that.  PAIN:  Are you having pain? 2/10   PRECAUTIONS: Left UE lymphedema risk, abdominal swelling from Endometrial surgery/hysterectomy 03/05/2023  WEIGHT BEARING RESTRICTIONS: No  FALLS:  Has patient fallen in last 6 months? 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, integrative lifestyle medicine, pelvic floor, CE  LEISURE: yoga, anything outside, reading  HAND DOMINANCE: left   PRIOR LEVEL OF FUNCTION: Independent  PATIENT GOALS: differential dx, abdominal compression,cording release,   OBJECTIVE:  COGNITION: Overall cognitive status: Within functional limits for tasks assessed   PALPATION: Cording noted left axillary region running to incision, and upper arm  OBSERVATIONS / OTHER ASSESSMENTS: generalized left breast swelling;no enlarged pores or fibrotic areas noted, limitations in left shoulder ROM and cording noted left axillary region into upper arm. Release techniques produce tingling in median nerve distribution of hand. Decreased  left scapular mobility. No visible abdominal swelling this am, but pt notices fullness that becomes pitting later in the day in the central abdomen. No rebound tenderness today noted in any bilateral upper or lower quarters of abdomen. 3 well healed abdominal incisions.  SENSATION: Light touch: Appears intact    POSTURE: WNL  UPPER EXTREMITY AROM/PROM:  A/PROM RIGHT   eval   Shoulder extension   Shoulder flexion 160  Shoulder abduction 175  Shoulder internal rotation 50  Shoulder external rotation 115    (Blank rows = not tested)  A/PROM LEFT   eval LEFT 09/05/2022  Shoulder extension    Shoulder flexion 134 158  Shoulder abduction 137 134 pain  Shoulder internal rotation 43   Shoulder external rotation 100     (Blank rows = not tested)       UPPER EXTREMITY STRENGTH: NT    LYMPHEDEMA ASSESSMENTS:   SURGERY TYPE/DATE: 02/10/2019 Removal of Benign Breast Mass, 06/22/2019 Excision of contracted scar , Endometriosis/hysterectomy surgery 03/04/2022  NUMBER OF LYMPH NODES REMOVED: 1 left axillary with excision of contracted scar  CHEMOTHERAPY: NO  RADIATION:NO  HORMONE TREATMENT: NO  INFECTIONS: YES (multiple seromas after breast surgery)  LYMPHEDEMA ASSESSMENTS:   LANDMARK RIGHT  eval  10 cm proximal to olecranon process 23.2  Olecranon process 21.7  10 cm proximal to ulnar styloid process 19.4  Just proximal to ulnar styloid process 14.6  Across hand at thumb web space 18.4  At base of 2nd digit 5.8  (Blank rows = not tested)  LANDMARK LEFT  eval  10 cm proximal to olecranon process 23.1  Olecranon process 21.6  10 cm proximal to ulnar styloid process 18.4  Just proximal to ulnar styloid process 14.5  Across hand at thumb web space 18.3  At base of 2nd digit 5.8  (Blank rows = not tested)      LLIS 43%  BREAST COMPLAINTS QUESTIONNAIRE Pain  4 Heaviness:5 Swollen feeling:5 Tense Skin:3 Redness:0 Bra Print:5 Size of Pores:0 Hard  feeling: 0 Total:   22  /80 A Score over 9 indicates lymphedema issues in the breast  TODAY'S TREATMENT:  DATE:  Pt permission and consent throughout each step of examination and treatment with modification and draping if requested when working on sensitive areas  09/19/2022 Scar mobilization left axilla Soft tissue mobilization left UT, lats, Teres, interscapular area with cupping to scapular area /lats/UT MFR to left axilla ,Upper arm and forearm with arm resting on pillow to address cording at 90 and 120 degrees abduction with longitudinal and S technique PROM left shoulder flexion, scaption, IR/ER    09/08/2022 Scar mobilization left axilla Soft tissue mobilization left UT, lats, Teres, interscapular area with cupping to same MFR to left axilla ,Upper arm and forearm with arm resting on pillow to address cording at 90 and 120 degrees abduction   09/05/2022 Gr 3/4 Left shoulder GH mobs posterior and inferior, Scar mobilization left axilla Soft tissue mobilization left UT, lats, Teres, interscapular area with cupping to same MFR to left axilla ,Upper arm and forearm with arm resting on pillow to address cording at 90 and 120 degrees abduction Measured AROM   08/25/2022 Gr 3/4 Left shoulder GH mobs posterior and inferior, Scar mobilization left axilla MFR to left axilla ,Upper arm and forearm with arm resting on pillow to address cording at 90 and 120 degrees with longitudinal and S technique MLD: In Supine to left UE after cording release: Short neck, superficial and deep abdominals, Lt inguinal nodes, Lt axillo-inguinal anastomosis, Rt axillary LN and right interaxillary pathway and Left UE; outer, inner to outer upper arm, and outer arm to shoulder and repeating pathways then left forearm both sides repeating pathway and ending with LN's Pt given  script for new compression sleeve and will go to a special place. 08/22/2022 Attempted supine wand for ROM but held due to impingement symptoms Gr 3/4 Left shoulder GH mobs posterior and inferior, SL scapular mobs pro/retraction, elevation,depression MLD: In Supine: Short neck, superficial and deep abdominals, Lt inguinal nodes, Lt axillo-inguinal anastomosis, Rt axillary and pectoral nodes, Rt intact upper quadrant sequence, anterior inter-axillary anastomosis, then Lt breast reviewing with pt while performing Scar mobilization MFR to left axilla ,Upper arm and forearm with arm resting on pillow to address cording  08/18/2022 MFR to Lt axilla and also to forearm where cording is noted and tightness is felt. Pts arm in scaption resting on pillow and also at 90 degrees abduction. Pt also applied pressure into axillary region while therapist performed release techniques to increase stretch on cords. Longitudinal and S technique utilized. PROM to left shoulder flexion, scaption, abduction, with pinning at axilla with no impingement felt. In supine: MLD to left UE :Short neck,  R axillary nodes and establishment of interaxillary pathway, L inguinal nodes and establishment of axilloinguinal pathway, then L UE working proximal to distal, moving fluid from upper inner arm outwards, and doing both sides of forearm moving fluid towards pathways then retracing all steps . Pt given handout for intact sequence and new handout for superficial abdominals  08/04/2022  MFR to Lt axilla and also to forearm where cording is noted and tightness is felt. Pts arm in scaption resting on pillow. Pt also applied pressure into axillary region several times to increase stretch. Longitudinal and S technique utilized. Also tried with pts arm into more flexion but discontinued due to impingement  MLD: In Supine: Short neck, superficial and deep abdominals, Lt inguinal nodes, Lt axillo-inguinal anastomosis, Rt axillary and pectoral  nodes, Rt intact upper quadrant sequence, anterior inter-axillary anastomosis, then Lt breast reviewing with pt while performing. Pt practiced abdominal techniques after  therapist demonstrated. Pt given superficial and deep abdominal handout.    07/29/22: Self Care Explained anatomy of lymphatic system to pt and basic principles of MLD and how the abdominal sequences we normally do could help alleviate the intermittent abdominal swelling but it might also be helpful to get some well fitting, high waisted biker shorts to help with compression against the abdomen prn. She could also use her swell spot with this intermittently as well.  Manual Therapy MLD: In Supine: Short neck, superficial and deep abdominals, Lt inguinal nodes, Lt axillo-inguinal anastomosis, Rt axillary and pectoral nodes, Rt intact upper quadrant sequence, anterior inter-axillary anastomosis, then Lt breast reviewing with pt while performing P/ROM to Lt shoulder into flexion, abd and D2 to pts end available ROM; neural tension stretch as well MFR to Lt axilla during P/ROM and also to forearm where cording palpable  07/23/22:evaluation of breast, left UE, abdominal region and discussion of plan of care. Pt advised to wear compression bra for breast swelling, and to start wearing her sleeve to assist with cording.  PATIENT EDUCATION:  Education details: Superficial and deep abdominals Person educated: Patient Education method: Explanation, demonstration, handout issued (drawn by therapist) Education comprehension: verbalized understanding, returned demo and will benefit from further review  HOME EXERCISE PROGRAM: Superficial and deep abdominals, handout issued   ASSESSMENT:  CLINICAL IMPRESSION: Pt is doing well managing symptoms of swelling with her compression garments. Cording is still present and release techniques do cause median nerve symptoms that recover with rest. Cording extends beyond incision toward left  breast   OBJECTIVE IMPAIRMENTS: decreased knowledge of condition, decreased ROM, hypomobility, increased edema, impaired UE functional use, and pain.   ACTIVITY LIMITATIONS: sleeping, bed mobility, and reach over head  PARTICIPATION LIMITATIONS:  social activities  PERSONAL FACTORS: 1-2 comorbidities: Breast and Left axillary surgery and recent abdominal surgery  are also affecting patient's functional outcome.   REHAB POTENTIAL: Excellent  CLINICAL DECISION MAKING: Stable/uncomplicated  EVALUATION COMPLEXITY: Low  GOALS: Goals reviewed with patient? Yes  SHORT TERM GOALS: Target date: 08/13/2023  Pt will be independent in a HEP for  ROM,NTS, stabilization to improve left shoulder ROM Baseline: Goal status:MET  2.  Pt will be independent in abdominal treatment to reduce swelling Baseline:  Goal status: MET 3.  Pt will have appropriate compression garments for abdominal swelling prn Baseline:  Goal status: MET 08/18/2022 4.  Pt will have decreased abdominal pain at night no greater than 3/10 Baseline:  Goal status: MET 08/25/2022  5.  Pt will have left shoulder Flex and abd improved by atleast 15 degrees Baseline: 134 flex, 137 abd Goal status: MET flexion, ongoing abduction due to impingement symptoms 6.  Pt will have decreased pain/median nerve symptoms in left UE to no greater than 2/10 Baseline:  Goal status: Ongoing LONG TERM GOALS: Target date: 09/03/2023/ 10/31/2022  Pt will have Left shoulder ROM within 7 degrees of right shoulder ROM Baseline: 160 flex, 175 abd Goal status: MET flex 09/05/22 2.  Pt will have abdominal pain no greater than 1/10 at night to allow better sleep Baseline: MET Goal status: 3.  Median nerve symptoms in left UE will no longer be present Baseline:  Goal status: improving,ONGOING 4.  Pt will be independent in self management of Left breast , and abdominal swelling, and shoulder stretching/strengthening Baseline:  Goal  status:ONGOING    PLAN:  PT FREQUENCY: 1-2x/week  PT DURATION: 8 weeks PLANNED INTERVENTIONS: Therapeutic exercises, Therapeutic activity, Neuromuscular re-education, Patient/Family education, Self Care,  Joint mobilization, Orthotic/Fit training, Dry Needling, Manual lymph drainage, scar mobilization, Vasopneumatic device, Manual therapy, and Re-evaluation  PLAN FOR NEXT SESSION: Cont abdominal treatment (superficial and deep abdominals)review intact sequence,  Review left breast MLD, MFR to cording, GH ,scap. Mobility and stabs. Pt has a Flexi touch at home and uses it, check compression sleeves: gave pt script to purchase new sleeves. Assess ROM   Waynette Buttery, PT 09/19/2022, 9:53 AM

## 2022-09-22 ENCOUNTER — Ambulatory Visit: Payer: Federal, State, Local not specified - PPO

## 2022-09-22 DIAGNOSIS — F4320 Adjustment disorder, unspecified: Secondary | ICD-10-CM | POA: Diagnosis not present

## 2022-09-22 DIAGNOSIS — I890 Lymphedema, not elsewhere classified: Secondary | ICD-10-CM

## 2022-09-22 DIAGNOSIS — Z483 Aftercare following surgery for neoplasm: Secondary | ICD-10-CM

## 2022-09-22 DIAGNOSIS — R600 Localized edema: Secondary | ICD-10-CM | POA: Diagnosis not present

## 2022-09-22 DIAGNOSIS — M25612 Stiffness of left shoulder, not elsewhere classified: Secondary | ICD-10-CM | POA: Diagnosis not present

## 2022-09-22 NOTE — Therapy (Signed)
OUTPATIENT PHYSICAL THERAPY ONCOLOGY TREATMENT  Patient Name: Sheena Jarvis MRN: [id number] DOB:01/01/1971, 52 y.o., female Today's Date: 09/22/2022  END OF SESSION:  PT End of Session - 09/22/22 0800     Visit Number 10    Number of Visits 23    Date for PT Re-Evaluation 10/31/22    PT Start Time 0802    PT Stop Time 0852    PT Time Calculation (min) 50 min    Activity Tolerance Patient tolerated treatment well    Behavior During Therapy [facility] for tasks assessed/performed             Past Medical History:  Diagnosis Date   Pneumonia    PONV (postoperative nausea and vomiting)    Vision abnormalities    Past Surgical History:  Procedure Laterality Date   APPENDECTOMY     BREAST BIOPSY Left 01/12/2019   Pash   BREAST BIOPSY Left 12/21/2018   benign   BREAST CYST ASPIRATION Left    BREAST EXCISIONAL BIOPSY Left 06/22/2019   Axilla BX - Benign   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   HIP ARTHROSCOPY     MASS EXCISION Left 02/10/2019   Procedure: EXCISION OF LEFT BREAST MASS;  Surgeon: Almond Lint, MD;  Location: [facility];  Service: General;  Laterality: Left;   SCAR REVISION Left 06/22/2019   Procedure: LEFT AXILLARY CONTRACTURE RELEASE;  Surgeon: Almond Lint, MD;  Location: [facility];  Service: General;  Laterality: Left;   Patient Active Problem List   Diagnosis Date Noted   Numbness 02/04/2016   Cognitive changes 02/04/2016   Other fatigue 02/04/2016     REFERRING PROVIDER: Remi Deter MD  REFERRING DIAG: Lymphedema post Endometrial surgery  THERAPY DIAG:  Lymphedema, not elsewhere classified  Aftercare following surgery for neoplasm  Localized edema  Stiffness of left shoulder, not elsewhere classified  ONSET DATE: Mar 04, 2022  Rationale for Evaluation and Treatment: Rehabilitation  SUBJECTIVE:                                                                                                                                                                                            SUBJECTIVE STATEMENT: Did fine after last visit. The cord still feels really tight with attempts at [facility] dog. I still get medial nerve stuff   PERTINENT HISTORY:  Pt had recent surgery for Stage II Endometriosis on Oct 31,2024. She had a prior history of removal of benign breast mass on 02/10/2019 with complication of seroma and clogged drain and possible infection. She also developed lymphedema in her left breast and  axillary cording. She receieved extensive PT with MLD, compression, manual therapy and dry needling, but had a significant persistent cord that was painful and limited shoulder ROM She underwent and incision of that contracted scar tissue on 06/22/2019 that included one node and develped a seroma after that.  PAIN:  Are you having pain? 0/10 at rest, 5/10 ith impingement at [facility] joint.  PRECAUTIONS: Left UE lymphedema risk, abdominal swelling from Endometrial surgery/hysterectomy 03/05/2023  WEIGHT BEARING RESTRICTIONS: No  FALLS:  Has patient fallen in last 6 months? 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, integrative lifestyle medicine, pelvic floor, CE  LEISURE: yoga, anything outside, reading  HAND DOMINANCE: left   PRIOR LEVEL OF FUNCTION: Independent  PATIENT GOALS: differential dx, abdominal compression,cording release,   OBJECTIVE:  COGNITION: Overall cognitive status: Within functional limits for tasks assessed   PALPATION: Cording noted left axillary region running to incision, and upper arm  OBSERVATIONS / OTHER ASSESSMENTS: generalized left breast swelling;no enlarged pores or fibrotic areas noted, limitations in left shoulder ROM and cording noted left axillary region into upper arm. Release techniques produce tingling in median nerve distribution of hand.  Decreased left scapular mobility. No visible abdominal swelling this am, but pt notices fullness that becomes pitting later in the day in the central abdomen. No rebound tenderness today noted in any bilateral upper or lower quarters of abdomen. 3 well healed abdominal incisions.  SENSATION: Light touch: Appears intact    POSTURE: WNL  UPPER EXTREMITY AROM/PROM:  A/PROM RIGHT   eval   Shoulder extension   Shoulder flexion 160  Shoulder abduction 175  Shoulder internal rotation 50  Shoulder external rotation 115    (Blank rows = not tested)  A/PROM LEFT   eval LEFT 09/05/2022  Shoulder extension    Shoulder flexion 134 158  Shoulder abduction 137 134 pain  Shoulder internal rotation 43   Shoulder external rotation 100     (Blank rows = not tested)       UPPER EXTREMITY STRENGTH: NT    LYMPHEDEMA ASSESSMENTS:   SURGERY TYPE/DATE: 02/10/2019 Removal of Benign Breast Mass, 06/22/2019 Excision of contracted scar , Endometriosis/hysterectomy surgery 03/04/2022  NUMBER OF LYMPH NODES REMOVED: 1 left axillary with excision of contracted scar  CHEMOTHERAPY: NO  RADIATION:NO  HORMONE TREATMENT: NO  INFECTIONS: YES (multiple seromas after breast surgery)  LYMPHEDEMA ASSESSMENTS:   LANDMARK RIGHT  eval  10 cm proximal to olecranon process 23.2  Olecranon process 21.7  10 cm proximal to ulnar styloid process 19.4  Just proximal to ulnar styloid process 14.6  Across hand at thumb web space 18.4  At base of 2nd digit 5.8  (Blank rows = not tested)  LANDMARK LEFT  eval  10 cm proximal to olecranon process 23.1  Olecranon process 21.6  10 cm proximal to ulnar styloid process 18.4  Just proximal to ulnar styloid process 14.5  Across hand at thumb web space 18.3  At base of 2nd digit 5.8  (Blank rows = not tested)      LLIS 43%  BREAST COMPLAINTS QUESTIONNAIRE Pain  4 Heaviness:5 Swollen feeling:5 Tense Skin:3 Redness:0 Bra Print:5 Size of  Pores:0 Hard feeling: 0 Total:   22  /80 A Score over 9 indicates lymphedema issues in the breast  TODAY'S TREATMENT:  DATE:  Pt permission and consent throughout each step of examination and treatment with modification and draping if requested when working on sensitive areas   09/22/2022 Scar mobilization left axilla Soft tissue mobilization left UT, lats, Teres, interscapular area with cupping to scapular area /lats/UT MFR to left axilla ,Upper arm and forearm with arm resting on pillow to address cording at 90 and 120 degrees abduction with longitudinal and S technique PROM left shoulder flexion, scaption,abduction   09/19/2022 Scar mobilization left axilla Soft tissue mobilization left UT, lats, Teres, interscapular area with cupping to scapular area /lats/UT MFR to left axilla ,Upper arm and forearm with arm resting on pillow to address cording at 90 and 120 degrees abduction with longitudinal and S technique PROM left shoulder flexion, scaption, IR/ER    09/08/2022 Scar mobilization left axilla Soft tissue mobilization left UT, lats, Teres, interscapular area with cupping to same MFR to left axilla ,Upper arm and forearm with arm resting on pillow to address cording at 90 and 120 degrees abduction   09/05/2022 Gr 3/4 Left shoulder GH mobs posterior and inferior, Scar mobilization left axilla Soft tissue mobilization left UT, lats, Teres, interscapular area with cupping to same MFR to left axilla ,Upper arm and forearm with arm resting on pillow to address cording at 90 and 120 degrees abduction Measured AROM   08/25/2022 Gr 3/4 Left shoulder GH mobs posterior and inferior, Scar mobilization left axilla MFR to left axilla ,Upper arm and forearm with arm resting on pillow to address cording at 90 and 120 degrees with longitudinal and S  technique MLD: In Supine to left UE after cording release: Short neck, superficial and deep abdominals, Lt inguinal nodes, Lt axillo-inguinal anastomosis, Rt axillary LN and right interaxillary pathway and Left UE; outer, inner to outer upper arm, and outer arm to shoulder and repeating pathways then left forearm both sides repeating pathway and ending with LN's Pt given script for new compression sleeve and will go to a special place. 08/22/2022 Attempted supine wand for ROM but held due to impingement symptoms Gr 3/4 Left shoulder GH mobs posterior and inferior, SL scapular mobs pro/retraction, elevation,depression MLD: In Supine: Short neck, superficial and deep abdominals, Lt inguinal nodes, Lt axillo-inguinal anastomosis, Rt axillary and pectoral nodes, Rt intact upper quadrant sequence, anterior inter-axillary anastomosis, then Lt breast reviewing with pt while performing Scar mobilization MFR to left axilla ,Upper arm and forearm with arm resting on pillow to address cording  08/18/2022 MFR to Lt axilla and also to forearm where cording is noted and tightness is felt. Pts arm in scaption resting on pillow and also at 90 degrees abduction. Pt also applied pressure into axillary region while therapist performed release techniques to increase stretch on cords. Longitudinal and S technique utilized. PROM to left shoulder flexion, scaption, abduction, with pinning at axilla with no impingement felt. In supine: MLD to left UE :Short neck,  R axillary nodes and establishment of interaxillary pathway, L inguinal nodes and establishment of axilloinguinal pathway, then L UE working proximal to distal, moving fluid from upper inner arm outwards, and doing both sides of forearm moving fluid towards pathways then retracing all steps . Pt given handout for intact sequence and new handout for superficial abdominals  08/04/2022  MFR to Lt axilla and also to forearm where cording is noted and tightness is felt.  Pts arm in scaption resting on pillow. Pt also applied pressure into axillary region several times to increase stretch. Longitudinal and S technique utilized.  Also tried with pts arm into more flexion but discontinued due to impingement  MLD: In Supine: Short neck, superficial and deep abdominals, Lt inguinal nodes, Lt axillo-inguinal anastomosis, Rt axillary and pectoral nodes, Rt intact upper quadrant sequence, anterior inter-axillary anastomosis, then Lt breast reviewing with pt while performing. Pt practiced abdominal techniques after therapist demonstrated. Pt given superficial and deep abdominal handout.    07/29/22: Self Care Explained anatomy of lymphatic system to pt and basic principles of MLD and how the abdominal sequences we normally do could help alleviate the intermittent abdominal swelling but it might also be helpful to get some well fitting, high waisted biker shorts to help with compression against the abdomen prn. She could also use her swell spot with this intermittently as well.  Manual Therapy MLD: In Supine: Short neck, superficial and deep abdominals, Lt inguinal nodes, Lt axillo-inguinal anastomosis, Rt axillary and pectoral nodes, Rt intact upper quadrant sequence, anterior inter-axillary anastomosis, then Lt breast reviewing with pt while performing P/ROM to Lt shoulder into flexion, abd and D2 to pts end available ROM; neural tension stretch as well MFR to Lt axilla during P/ROM and also to forearm where cording palpable  07/23/22:evaluation of breast, left UE, abdominal region and discussion of plan of care. Pt advised to wear compression bra for breast swelling, and to start wearing her sleeve to assist with cording.  PATIENT EDUCATION:  Education details: Superficial and deep abdominals Person educated: Patient Education method: Explanation, demonstration, handout issued (drawn by therapist) Education comprehension: verbalized understanding, returned demo and will  benefit from further review  HOME EXERCISE PROGRAM: Superficial and deep abdominals, handout issued   ASSESSMENT:  CLINICAL IMPRESSION: Pt continues to feel restricted in the axillary region in the region of scar and feels like impingement sensation stems from this area rather than a true impingement. Cording release continues to create median nerve symptoms.   OBJECTIVE IMPAIRMENTS: decreased knowledge of condition, decreased ROM, hypomobility, increased edema, impaired UE functional use, and pain.   ACTIVITY LIMITATIONS: sleeping, bed mobility, and reach over head  PARTICIPATION LIMITATIONS:  social activities  PERSONAL FACTORS: 1-2 comorbidities: Breast and Left axillary surgery and recent abdominal surgery  are also affecting patient's functional outcome.   REHAB POTENTIAL: Excellent  CLINICAL DECISION MAKING: Stable/uncomplicated  EVALUATION COMPLEXITY: Low  GOALS: Goals reviewed with patient? Yes  SHORT TERM GOALS: Target date: 08/13/2023  Pt will be independent in a HEP for  ROM,NTS, stabilization to improve left shoulder ROM Baseline: Goal status:MET  2.  Pt will be independent in abdominal treatment to reduce swelling Baseline:  Goal status: MET 3.  Pt will have appropriate compression garments for abdominal swelling prn Baseline:  Goal status: MET 08/18/2022 4.  Pt will have decreased abdominal pain at night no greater than 3/10 Baseline:  Goal status: MET 08/25/2022  5.  Pt will have left shoulder Flex and abd improved by atleast 15 degrees Baseline: 134 flex, 137 abd Goal status: MET flexion, ongoing abduction due to impingement symptoms 6.  Pt will have decreased pain/median nerve symptoms in left UE to no greater than 2/10 Baseline:  Goal status: Ongoing LONG TERM GOALS: Target date: 09/03/2023/ 10/31/2022  Pt will have Left shoulder ROM within 7 degrees of right shoulder ROM Baseline: 160 flex, 175 abd Goal status: MET flex 09/05/22 2.  Pt will have  abdominal pain no greater than 1/10 at night to allow better sleep Baseline: MET Goal status: 3.  Median nerve symptoms in left UE will no  longer be present Baseline:  Goal status: improving,ONGOING 4.  Pt will be independent in self management of Left breast , and abdominal swelling, and shoulder stretching/strengthening Baseline:  Goal status:ONGOING    PLAN:  PT FREQUENCY: 1-2x/week  PT DURATION: 8 weeks PLANNED INTERVENTIONS: Therapeutic exercises, Therapeutic activity, Neuromuscular re-education, Patient/Family education, Self Care, Joint mobilization, Orthotic/Fit training, Dry Needling, Manual lymph drainage, scar mobilization, Vasopneumatic device, Manual therapy, and Re-evaluation  PLAN FOR NEXT SESSION: Dry needling? Cont abdominal treatment (superficial and deep abdominals)review intact sequence,  Review left breast MLD, MFR to cording. Pt has a Flexi touch at home and uses it, check compression sleeves: gave pt script to purchase new sleeves. Assess ROM   Waynette Buttery, PT 09/22/2022, 8:56 AM

## 2022-09-29 DIAGNOSIS — F4320 Adjustment disorder, unspecified: Secondary | ICD-10-CM | POA: Diagnosis not present

## 2022-10-03 ENCOUNTER — Ambulatory Visit: Payer: Federal, State, Local not specified - PPO

## 2022-10-03 DIAGNOSIS — Z483 Aftercare following surgery for neoplasm: Secondary | ICD-10-CM | POA: Diagnosis not present

## 2022-10-03 DIAGNOSIS — M25612 Stiffness of left shoulder, not elsewhere classified: Secondary | ICD-10-CM

## 2022-10-03 DIAGNOSIS — I890 Lymphedema, not elsewhere classified: Secondary | ICD-10-CM

## 2022-10-03 DIAGNOSIS — R600 Localized edema: Secondary | ICD-10-CM

## 2022-10-03 NOTE — Therapy (Signed)
OUTPATIENT PHYSICAL THERAPY ONCOLOGY TREATMENT  Patient Name: Sheena Jarvis MRN: [id number] DOB:[birth year], 52 y.o., female Today's Date: 10/03/2022  END OF SESSION:  PT End of Session - 10/03/22 1102     Visit Number 11    Number of Visits 23    Date for PT Re-Evaluation 10/31/22    PT Start Time 1105    PT Stop Time 1150    PT Time Calculation (min) 45 min    Activity Tolerance Patient tolerated treatment well    Behavior During Therapy WFL for tasks assessed/performed             Past Medical History:  Diagnosis Date   Pneumonia    PONV (postoperative nausea and vomiting)    Vision abnormalities    Past Surgical History:  Procedure Laterality Date   APPENDECTOMY     BREAST BIOPSY Left 01/12/2019   Pash   BREAST BIOPSY Left 12/21/2018   benign   BREAST CYST ASPIRATION Left    BREAST EXCISIONAL BIOPSY Left 06/22/2019   Axilla BX - Benign   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   HIP ARTHROSCOPY     MASS EXCISION Left 02/10/2019   Procedure: EXCISION OF LEFT BREAST MASS;  Surgeon: Almond Lint, MD;  Location: [facility];  Service: General;  Laterality: Left;   SCAR REVISION Left 06/22/2019   Procedure: LEFT AXILLARY CONTRACTURE RELEASE;  Surgeon: Almond Lint, MD;  Location: [facility];  Service: General;  Laterality: Left;   Patient Active Problem List   Diagnosis Date Noted   Numbness 02/04/2016   Cognitive changes 02/04/2016   Other fatigue 02/04/2016     REFERRING PROVIDER: Remi Deter MD  REFERRING DIAG: Lymphedema post Endometrial surgery  THERAPY DIAG:  Lymphedema, not elsewhere classified  Aftercare following surgery for neoplasm  Localized edema  Stiffness of left shoulder, not elsewhere classified  ONSET DATE: Mar 04, 2022  Rationale for Evaluation and Treatment: Rehabilitation  SUBJECTIVE:                                                                                                                                                                                            SUBJECTIVE STATEMENT:  We leave on our trip June 9. Lots to do before then. I haven't noticed the cording as much and I haven't felt the impingement intentionally. I feel like the downward facing dog is much better.   PERTINENT HISTORY:  Pt had recent surgery for Stage II Endometriosis on Oct 31,2024. She had a prior history of removal of benign breast mass on 02/10/2019 with complication of seroma  and clogged drain and possible infection. She also developed lymphedema in her left breast and axillary cording. She receieved extensive PT with MLD, compression, manual therapy and dry needling, but had a significant persistent cord that was painful and limited shoulder ROM She underwent and incision of that contracted scar tissue on 06/22/2019 that included one node and develped a seroma after that.  PAIN:  Are you having pain? 0/10 at rest, 5/10 ith impingement at [facility] joint.  PRECAUTIONS: Left UE lymphedema risk, abdominal swelling from Endometrial surgery/hysterectomy 03/05/2023  WEIGHT BEARING RESTRICTIONS: No  FALLS:  Has patient fallen in last 6 months? 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, integrative lifestyle medicine, pelvic floor, CE  LEISURE: yoga, anything outside, reading  HAND DOMINANCE: left   PRIOR LEVEL OF FUNCTION: Independent  PATIENT GOALS: differential dx, abdominal compression,cording release,   OBJECTIVE:  COGNITION: Overall cognitive status: Within functional limits for tasks assessed   PALPATION: Cording noted left axillary region running to incision, and upper arm  OBSERVATIONS / OTHER ASSESSMENTS: generalized left breast swelling;no enlarged pores or fibrotic areas noted, limitations in left shoulder ROM and cording noted left axillary region into upper arm. Release  techniques produce tingling in median nerve distribution of hand. Decreased left scapular mobility. No visible abdominal swelling this am, but pt notices fullness that becomes pitting later in the day in the central abdomen. No rebound tenderness today noted in any bilateral upper or lower quarters of abdomen. 3 well healed abdominal incisions.  SENSATION: Light touch: Appears intact    POSTURE: WNL  UPPER EXTREMITY AROM/PROM:  A/PROM RIGHT   eval   Shoulder extension   Shoulder flexion 160  Shoulder abduction 175  Shoulder internal rotation 50  Shoulder external rotation 115    (Blank rows = not tested)  A/PROM LEFT   eval LEFT 09/05/2022  Shoulder extension    Shoulder flexion 134 158  Shoulder abduction 137 134 pain  Shoulder internal rotation 43   Shoulder external rotation 100     (Blank rows = not tested)       UPPER EXTREMITY STRENGTH: NT    LYMPHEDEMA ASSESSMENTS:   SURGERY TYPE/DATE: 02/10/2019 Removal of Benign Breast Mass, 06/22/2019 Excision of contracted scar , Endometriosis/hysterectomy surgery 03/04/2022  NUMBER OF LYMPH NODES REMOVED: 1 left axillary with excision of contracted scar  CHEMOTHERAPY: NO  RADIATION:NO  HORMONE TREATMENT: NO  INFECTIONS: YES (multiple seromas after breast surgery)  LYMPHEDEMA ASSESSMENTS:   LANDMARK RIGHT  eval  10 cm proximal to olecranon process 23.2  Olecranon process 21.7  10 cm proximal to ulnar styloid process 19.4  Just proximal to ulnar styloid process 14.6  Across hand at thumb web space 18.4  At base of 2nd digit 5.8  (Blank rows = not tested)  LANDMARK LEFT  eval  10 cm proximal to olecranon process 23.1  Olecranon process 21.6  10 cm proximal to ulnar styloid process 18.4  Just proximal to ulnar styloid process 14.5  Across hand at thumb web space 18.3  At base of 2nd digit 5.8  (Blank rows = not tested)      LLIS 43%  BREAST COMPLAINTS QUESTIONNAIRE Pain  4 Heaviness:5 Swollen  feeling:5 Tense Skin:3 Redness:0 Bra Print:5 Size of Pores:0 Hard feeling: 0 Total:   22  /80 A Score over 9 indicates lymphedema issues in the breast  TODAY'S TREATMENT:  DATE:  Pt permission and consent throughout each step of examination and treatment with modification and draping if requested when working on sensitive areas 10/03/2022 Scar mobilization left axilla MFR to left axilla ,Upper arm and forearm with arm resting on pillow to address cording at120 degrees abduction with longitudinal and S technique   09/22/2022 Scar mobilization left axilla Soft tissue mobilization left UT, lats, Teres, interscapular area with cupping to scapular area /lats/UT MFR to left axilla ,Upper arm and forearm with arm resting on pillow to address cording at 90 and 120 degrees abduction with longitudinal and S technique PROM left shoulder flexion, scaption,abduction   09/19/2022 Scar mobilization left axilla Soft tissue mobilization left UT, lats, Teres, interscapular area with cupping to scapular area /lats/UT MFR to left axilla ,Upper arm and forearm with arm resting on pillow to address cording at 90 and 120 degrees abduction with longitudinal and S technique PROM left shoulder flexion, scaption, IR/ER    09/08/2022 Scar mobilization left axilla Soft tissue mobilization left UT, lats, Teres, interscapular area with cupping to same MFR to left axilla ,Upper arm and forearm with arm resting on pillow to address cording at 90 and 120 degrees abduction   09/05/2022 Gr 3/4 Left shoulder GH mobs posterior and inferior, Scar mobilization left axilla Soft tissue mobilization left UT, lats, Teres, interscapular area with cupping to same MFR to left axilla ,Upper arm and forearm with arm resting on pillow to address cording at 90 and 120 degrees abduction Measured  AROM   08/25/2022 Gr 3/4 Left shoulder GH mobs posterior and inferior, Scar mobilization left axilla MFR to left axilla ,Upper arm and forearm with arm resting on pillow to address cording at 90 and 120 degrees with longitudinal and S technique MLD: In Supine to left UE after cording release: Short neck, superficial and deep abdominals, Lt inguinal nodes, Lt axillo-inguinal anastomosis, Rt axillary LN and right interaxillary pathway and Left UE; outer, inner to outer upper arm, and outer arm to shoulder and repeating pathways then left forearm both sides repeating pathway and ending with LN's Pt given script for new compression sleeve and will go to a special place. 08/22/2022 Attempted supine wand for ROM but held due to impingement symptoms Gr 3/4 Left shoulder GH mobs posterior and inferior, SL scapular mobs pro/retraction, elevation,depression MLD: In Supine: Short neck, superficial and deep abdominals, Lt inguinal nodes, Lt axillo-inguinal anastomosis, Rt axillary and pectoral nodes, Rt intact upper quadrant sequence, anterior inter-axillary anastomosis, then Lt breast reviewing with pt while performing Scar mobilization MFR to left axilla ,Upper arm and forearm with arm resting on pillow to address cording  08/18/2022 MFR to Lt axilla and also to forearm where cording is noted and tightness is felt. Pts arm in scaption resting on pillow and also at 90 degrees abduction. Pt also applied pressure into axillary region while therapist performed release techniques to increase stretch on cords. Longitudinal and S technique utilized. PROM to left shoulder flexion, scaption, abduction, with pinning at axilla with no impingement felt. In supine: MLD to left UE :Short neck,  R axillary nodes and establishment of interaxillary pathway, L inguinal nodes and establishment of axilloinguinal pathway, then L UE working proximal to distal, moving fluid from upper inner arm outwards, and doing both sides of  forearm moving fluid towards pathways then retracing all steps . Pt given handout for intact sequence and new handout for superficial abdominals  08/04/2022  MFR to Lt axilla and also to forearm where cording is  noted and tightness is felt. Pts arm in scaption resting on pillow. Pt also applied pressure into axillary region several times to increase stretch. Longitudinal and S technique utilized. Also tried with pts arm into more flexion but discontinued due to impingement  MLD: In Supine: Short neck, superficial and deep abdominals, Lt inguinal nodes, Lt axillo-inguinal anastomosis, Rt axillary and pectoral nodes, Rt intact upper quadrant sequence, anterior inter-axillary anastomosis, then Lt breast reviewing with pt while performing. Pt practiced abdominal techniques after therapist demonstrated. Pt given superficial and deep abdominal handout.    07/29/22: Self Care Explained anatomy of lymphatic system to pt and basic principles of MLD and how the abdominal sequences we normally do could help alleviate the intermittent abdominal swelling but it might also be helpful to get some well fitting, high waisted biker shorts to help with compression against the abdomen prn. She could also use her swell spot with this intermittently as well.  Manual Therapy MLD: In Supine: Short neck, superficial and deep abdominals, Lt inguinal nodes, Lt axillo-inguinal anastomosis, Rt axillary and pectoral nodes, Rt intact upper quadrant sequence, anterior inter-axillary anastomosis, then Lt breast reviewing with pt while performing P/ROM to Lt shoulder into flexion, abd and D2 to pts end available ROM; neural tension stretch as well MFR to Lt axilla during P/ROM and also to forearm where cording palpable  07/23/22:evaluation of breast, left UE, abdominal region and discussion of plan of care. Pt advised to wear compression bra for breast swelling, and to start wearing her sleeve to assist with cording.  PATIENT  EDUCATION:  Education details: Superficial and deep abdominals Person educated: Patient Education method: Explanation, demonstration, handout issued (drawn by therapist) Education comprehension: verbalized understanding, returned demo and will benefit from further review  HOME EXERCISE PROGRAM: Superficial and deep abdominals, handout issued   ASSESSMENT:  CLINICAL IMPRESSION: Several small tight cords still noted in left axillary region with sharp pain/ache reproduced with release techniques , however pt notes that her overall symptoms have improved and she is not as aware of her left shoulder impingement or axillary pain with her usual activities  OBJECTIVE IMPAIRMENTS: decreased knowledge of condition, decreased ROM, hypomobility, increased edema, impaired UE functional use, and pain.   ACTIVITY LIMITATIONS: sleeping, bed mobility, and reach over head  PARTICIPATION LIMITATIONS:  social activities  PERSONAL FACTORS: 1-2 comorbidities: Breast and Left axillary surgery and recent abdominal surgery  are also affecting patient's functional outcome.   REHAB POTENTIAL: Excellent  CLINICAL DECISION MAKING: Stable/uncomplicated  EVALUATION COMPLEXITY: Low  GOALS: Goals reviewed with patient? Yes  SHORT TERM GOALS: Target date: 08/13/2023  Pt will be independent in a HEP for  ROM,NTS, stabilization to improve left shoulder ROM Baseline: Goal status:MET  2.  Pt will be independent in abdominal treatment to reduce swelling Baseline:  Goal status: MET 3.  Pt will have appropriate compression garments for abdominal swelling prn Baseline:  Goal status: MET 08/18/2022 4.  Pt will have decreased abdominal pain at night no greater than 3/10 Baseline:  Goal status: MET 08/25/2022  5.  Pt will have left shoulder Flex and abd improved by atleast 15 degrees Baseline: 134 flex, 137 abd Goal status: MET flexion, ongoing abduction due to impingement symptoms 6.  Pt will have decreased  pain/median nerve symptoms in left UE to no greater than 2/10 Baseline:  Goal status: Ongoing LONG TERM GOALS: Target date: 09/03/2023/ 10/31/2022  Pt will have Left shoulder ROM within 7 degrees of right shoulder ROM Baseline: 160 flex, 175  abd Goal status: MET flex 09/05/22 2.  Pt will have abdominal pain no greater than 1/10 at night to allow better sleep Baseline: MET Goal status: 3.  Median nerve symptoms in left UE will no longer be present Baseline:  Goal status: improving,ONGOING 4.  Pt will be independent in self management of Left breast , and abdominal swelling, and shoulder stretching/strengthening Baseline:  Goal status:ONGOING    PLAN:  PT FREQUENCY: 1-2x/week  PT DURATION: 8 weeks PLANNED INTERVENTIONS: Therapeutic exercises, Therapeutic activity, Neuromuscular re-education, Patient/Family education, Self Care, Joint mobilization, Orthotic/Fit training, Dry Needling, Manual lymph drainage, scar mobilization, Vasopneumatic device, Manual therapy, and Re-evaluation  PLAN FOR NEXT SESSION: Dry needling? Cont abdominal treatment (superficial and deep abdominals)review intact sequence,  Review left breast MLD, MFR to cording. Pt has a Flexi touch at home and uses it, check compression sleeves: gave pt script to purchase new sleeves. Assess ROM   Waynette Buttery, PT 10/03/2022, 12:02 PM

## 2022-10-07 DIAGNOSIS — R5383 Other fatigue: Secondary | ICD-10-CM | POA: Diagnosis not present

## 2022-10-07 DIAGNOSIS — Z8249 Family history of ischemic heart disease and other diseases of the circulatory system: Secondary | ICD-10-CM | POA: Diagnosis not present

## 2022-10-07 DIAGNOSIS — E639 Nutritional deficiency, unspecified: Secondary | ICD-10-CM | POA: Diagnosis not present

## 2022-10-07 DIAGNOSIS — K580 Irritable bowel syndrome with diarrhea: Secondary | ICD-10-CM | POA: Diagnosis not present

## 2022-10-07 DIAGNOSIS — E611 Iron deficiency: Secondary | ICD-10-CM | POA: Diagnosis not present

## 2022-10-07 DIAGNOSIS — N920 Excessive and frequent menstruation with regular cycle: Secondary | ICD-10-CM | POA: Diagnosis not present

## 2022-10-07 DIAGNOSIS — N951 Menopausal and female climacteric states: Secondary | ICD-10-CM | POA: Diagnosis not present

## 2022-10-07 DIAGNOSIS — E559 Vitamin D deficiency, unspecified: Secondary | ICD-10-CM | POA: Diagnosis not present

## 2022-10-28 ENCOUNTER — Ambulatory Visit: Payer: Federal, State, Local not specified - PPO | Attending: Obstetrics and Gynecology

## 2022-10-28 DIAGNOSIS — R600 Localized edema: Secondary | ICD-10-CM | POA: Insufficient documentation

## 2022-10-28 DIAGNOSIS — Z483 Aftercare following surgery for neoplasm: Secondary | ICD-10-CM | POA: Insufficient documentation

## 2022-10-28 DIAGNOSIS — I890 Lymphedema, not elsewhere classified: Secondary | ICD-10-CM | POA: Diagnosis not present

## 2022-10-28 DIAGNOSIS — M25612 Stiffness of left shoulder, not elsewhere classified: Secondary | ICD-10-CM | POA: Diagnosis not present

## 2022-10-28 NOTE — Therapy (Signed)
OUTPATIENT PHYSICAL THERAPY ONCOLOGY TREATMENT  Patient Name: Sheena Jarvis MRN: [id number] DOB:[birth year], 52 y.o., female Today's Date: 10/28/2022  END OF SESSION:  PT End of Session - 10/28/22 1459     Visit Number 12    Number of Visits 20    Date for PT Re-Evaluation 11/25/22    PT Start Time 1501    PT Stop Time 1552    PT Time Calculation (min) 51 min    Activity Tolerance Patient tolerated treatment well    Behavior During Therapy [facility] for tasks assessed/performed             Past Medical History:  Diagnosis Date   Pneumonia    PONV (postoperative nausea and vomiting)    Vision abnormalities    Past Surgical History:  Procedure Laterality Date   APPENDECTOMY     BREAST BIOPSY Left 01/12/2019   Pash   BREAST BIOPSY Left 12/21/2018   benign   BREAST CYST ASPIRATION Left    BREAST EXCISIONAL BIOPSY Left 06/22/2019   Axilla BX - Benign   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   HIP ARTHROSCOPY     MASS EXCISION Left 02/10/2019   Procedure: EXCISION OF LEFT BREAST MASS;  Surgeon: Almond Lint, MD;  Location: [facility];  Service: General;  Laterality: Left;   SCAR REVISION Left 06/22/2019   Procedure: LEFT AXILLARY CONTRACTURE RELEASE;  Surgeon: Almond Lint, MD;  Location: [facility];  Service: General;  Laterality: Left;   Patient Active Problem List   Diagnosis Date Noted   Numbness 02/04/2016   Cognitive changes 02/04/2016   Other fatigue 02/04/2016     REFERRING PROVIDER: Remi Deter MD  REFERRING DIAG: Lymphedema post Endometrial surgery  THERAPY DIAG:  Lymphedema, not elsewhere classified  Aftercare following surgery for neoplasm  Localized edema  Stiffness of left shoulder, not elsewhere classified  ONSET DATE: Mar 04, 2022  Rationale for Evaluation and Treatment: Rehabilitation  SUBJECTIVE:                                                                                                                                                                                            SUBJECTIVE STATEMENT:  I got back last night at 1000 pm from [place].. I did really well on the trip. I forgot my sleeve. I wore the sports bras but my breast got very swollen and hard.. I did some MLD but I had to do it every day. Even my other breast got swollen  It stayed bad for 2 weeks. I can feel the cording but it didn't restrict me.  I need to start using my pump again now that I am home.   PERTINENT HISTORY:  Pt had recent surgery for Stage II Endometriosis on Oct 31,2024. She had a prior history of removal of benign breast mass on 02/10/2019 with complication of seroma and clogged drain and possible infection. She also developed lymphedema in her left breast and axillary cording. She receieved extensive PT with MLD, compression, manual therapy and dry needling, but had a significant persistent cord that was painful and limited shoulder ROM She underwent and incision of that contracted scar tissue on 06/22/2019 that included one node and develped a seroma after that.  PAIN:  Are you having pain? 0/10 at rest, 5/10 ith impingement at [facility] joint.  PRECAUTIONS: Left UE lymphedema risk, abdominal swelling from Endometrial surgery/hysterectomy 03/05/2023  WEIGHT BEARING RESTRICTIONS: No  FALLS:  Has patient fallen in last 6 months? 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, integrative lifestyle medicine, pelvic floor, CE  LEISURE: yoga, anything outside, reading  HAND DOMINANCE: left   PRIOR LEVEL OF FUNCTION: Independent  PATIENT GOALS: differential dx, abdominal compression,cording release,   OBJECTIVE:  COGNITION: Overall cognitive status: Within functional limits for tasks assessed   PALPATION: Cording noted left axillary region running to incision, and upper arm  OBSERVATIONS / OTHER  ASSESSMENTS: generalized left breast swelling;no enlarged pores or fibrotic areas noted, limitations in left shoulder ROM and cording noted left axillary region into upper arm. Release techniques produce tingling in median nerve distribution of hand. Decreased left scapular mobility. No visible abdominal swelling this am, but pt notices fullness that becomes pitting later in the day in the central abdomen. No rebound tenderness today noted in any bilateral upper or lower quarters of abdomen. 3 well healed abdominal incisions.  SENSATION: Light touch: Appears intact    POSTURE: WNL  UPPER EXTREMITY AROM/PROM:  A/PROM RIGHT   eval   Shoulder extension   Shoulder flexion 160  Shoulder abduction 175  Shoulder internal rotation 50  Shoulder external rotation 115    (Blank rows = not tested)  A/PROM LEFT   eval LEFT 09/05/2022 LEFT 10/28/22  Shoulder extension     Shoulder flexion 134 158 155  Shoulder abduction 137 134 pain 150  Shoulder internal rotation 43    Shoulder external rotation 100      (Blank rows = not tested)       UPPER EXTREMITY STRENGTH: NT    LYMPHEDEMA ASSESSMENTS:   SURGERY TYPE/DATE: 02/10/2019 Removal of Benign Breast Mass, 06/22/2019 Excision of contracted scar , Endometriosis/hysterectomy surgery 03/04/2022  NUMBER OF LYMPH NODES REMOVED: 1 left axillary with excision of contracted scar  CHEMOTHERAPY: NO  RADIATION:NO  HORMONE TREATMENT: NO  INFECTIONS: YES (multiple seromas after breast surgery)  LYMPHEDEMA ASSESSMENTS:   LANDMARK RIGHT  eval  10 cm proximal to olecranon process 23.2  Olecranon process 21.7  10 cm proximal to ulnar styloid process 19.4  Just proximal to ulnar styloid process 14.6  Across hand at thumb web space 18.4  At base of 2nd digit 5.8  (Blank rows = not tested)  LANDMARK LEFT  eval  10 cm proximal to olecranon process 23.1  Olecranon process 21.6  10 cm proximal to ulnar styloid process 18.4  Just proximal  to ulnar styloid process 14.5  Across hand at thumb web space 18.3  At base of 2nd digit 5.8  (Blank rows =  not tested)      LLIS 43%  BREAST COMPLAINTS QUESTIONNAIRE Pain  4 Heaviness:5 Swollen feeling:5 Tense Skin:3 Redness:0 Bra Print:5 Size of Pores:0 Hard feeling: 0 Total:   22  /80 A Score over 9 indicates lymphedema issues in the breast  TODAY'S TREATMENT:                                                                                                                                         DATE:  Pt permission and consent throughout each step of examination and treatment with modification and draping if requested when working on sensitive areas  10/28/22 Scar mobilization left axilla MFR to left axilla ,Upper arm and forearm with arm resting on pillow to address cording at120 degrees abduction with longitudinal and S technique MLD: In Supine: Short neck, superficial and deep abdominals, Lt inguinal nodes, Lt axillo-inguinal anastomosis, Rt axillary and pectoral nodes, Rt intact upper quadrant sequence, anterior inter-axillary anastomosis, then Lt breast reviewing with pt while performing and ending with LN's. Large area of induration and instructed pt to mobilize that before or during MLD. Made pt a new chip pack to place in bra over firm area. Discussed further air travel; pt does not fit well into a regular compression bra like [place] because of small build but larger breast size. Discussed resilience bra as an option or possibly a custom Cami. Gave her picture of resilience bra to consider and she will look into it. Emailed [employer] Pickle to see if she measures for custom cami's.   10/03/2022 Scar mobilization left axilla MFR to left axilla ,Upper arm and forearm with arm resting on pillow to address cording at120 degrees abduction with longitudinal and S technique   09/22/2022 Scar mobilization left axilla Soft tissue mobilization left UT, lats, Teres, interscapular area  with cupping to scapular area /lats/UT MFR to left axilla ,Upper arm and forearm with arm resting on pillow to address cording at 90 and 120 degrees abduction with longitudinal and S technique PROM left shoulder flexion, scaption,abduction   09/19/2022 Scar mobilization left axilla Soft tissue mobilization left UT, lats, Teres, interscapular area with cupping to scapular area /lats/UT MFR to left axilla ,Upper arm and forearm with arm resting on pillow to address cording at 90 and 120 degrees abduction with longitudinal and S technique PROM left shoulder flexion, scaption, IR/ER    09/08/2022 Scar mobilization left axilla Soft tissue mobilization left UT, lats, Teres, interscapular area with cupping to same MFR to left axilla ,Upper arm and forearm with arm resting on pillow to address cording at 90 and 120 degrees abduction   09/05/2022 Gr 3/4 Left shoulder GH mobs posterior and inferior, Scar mobilization left axilla Soft tissue mobilization left UT, lats, Teres, interscapular area with cupping to same MFR to left axilla ,Upper arm and forearm with arm resting on pillow to address cording at 90 and 120 degrees abduction  Measured AROM   08/25/2022 Gr 3/4 Left shoulder GH mobs posterior and inferior, Scar mobilization left axilla MFR to left axilla ,Upper arm and forearm with arm resting on pillow to address cording at 90 and 120 degrees with longitudinal and S technique MLD: In Supine to left UE after cording release: Short neck, superficial and deep abdominals, Lt inguinal nodes, Lt axillo-inguinal anastomosis, Rt axillary LN and right interaxillary pathway and Left UE; outer, inner to outer upper arm, and outer arm to shoulder and repeating pathways then left forearm both sides repeating pathway and ending with LN's Pt given script for new compression sleeve and will go to a special place. 08/22/2022 Attempted supine wand for ROM but held due to impingement symptoms Gr 3/4 Left  shoulder GH mobs posterior and inferior, SL scapular mobs pro/retraction, elevation,depression MLD: In Supine: Short neck, superficial and deep abdominals, Lt inguinal nodes, Lt axillo-inguinal anastomosis, Rt axillary and pectoral nodes, Rt intact upper quadrant sequence, anterior inter-axillary anastomosis, then Lt breast reviewing with pt while performing Scar mobilization MFR to left axilla ,Upper arm and forearm with arm resting on pillow to address cording  08/18/2022 MFR to Lt axilla and also to forearm where cording is noted and tightness is felt. Pts arm in scaption resting on pillow and also at 90 degrees abduction. Pt also applied pressure into axillary region while therapist performed release techniques to increase stretch on cords. Longitudinal and S technique utilized. PROM to left shoulder flexion, scaption, abduction, with pinning at axilla with no impingement felt. In supine: MLD to left UE :Short neck,  R axillary nodes and establishment of interaxillary pathway, L inguinal nodes and establishment of axilloinguinal pathway, then L UE working proximal to distal, moving fluid from upper inner arm outwards, and doing both sides of forearm moving fluid towards pathways then retracing all steps . Pt given handout for intact sequence and new handout for superficial abdominals  08/04/2022  MFR to Lt axilla and also to forearm where cording is noted and tightness is felt. Pts arm in scaption resting on pillow. Pt also applied pressure into axillary region several times to increase stretch. Longitudinal and S technique utilized. Also tried with pts arm into more flexion but discontinued due to impingement  MLD: In Supine: Short neck, superficial and deep abdominals, Lt inguinal nodes, Lt axillo-inguinal anastomosis, Rt axillary and pectoral nodes, Rt intact upper quadrant sequence, anterior inter-axillary anastomosis, then Lt breast reviewing with pt while performing. Pt practiced abdominal  techniques after therapist demonstrated. Pt given superficial and deep abdominal handout.    07/29/22: Self Care Explained anatomy of lymphatic system to pt and basic principles of MLD and how the abdominal sequences we normally do could help alleviate the intermittent abdominal swelling but it might also be helpful to get some well fitting, high waisted biker shorts to help with compression against the abdomen prn. She could also use her swell spot with this intermittently as well.  Manual Therapy MLD: In Supine: Short neck, superficial and deep abdominals, Lt inguinal nodes, Lt axillo-inguinal anastomosis, Rt axillary and pectoral nodes, Rt intact upper quadrant sequence, anterior inter-axillary anastomosis, then Lt breast reviewing with pt while performing P/ROM to Lt shoulder into flexion, abd and D2 to pts end available ROM; neural tension stretch as well MFR to Lt axilla during P/ROM and also to forearm where cording palpable  07/23/22:evaluation of breast, left UE, abdominal region and discussion of plan of care. Pt advised to wear compression bra for breast swelling, and  to start wearing her sleeve to assist with cording.  PATIENT EDUCATION:  Education details: Superficial and deep abdominals Person educated: Patient Education method: Explanation, demonstration, handout issued (drawn by therapist) Education comprehension: verbalized understanding, returned demo and will benefit from further review  HOME EXERCISE PROGRAM: Superficial and deep abdominals, handout issued   ASSESSMENT:  CLINICAL IMPRESSION: Pt was doing very well until a recent trip to [place] when she developed significant left breast swelling and mild right breast swelling despite wearing a compressive sports bra. There is a large area of induration  proximal to the nipple area. She will start to use her Flexi touch again now that she is home, but will benefit from MLD and continued instruction. Her cording is still  present and restricted but without median nerve signs presently and ROM is improving but still limited on the left. She will benefit from continued skilled PT to address deficits and return to PLOF.  OBJECTIVE IMPAIRMENTS: decreased knowledge of condition, decreased ROM, hypomobility, increased edema, impaired UE functional use, and pain.   ACTIVITY LIMITATIONS: sleeping, bed mobility, and reach over head  PARTICIPATION LIMITATIONS:  social activities  PERSONAL FACTORS: 1-2 comorbidities: Breast and Left axillary surgery and recent abdominal surgery  are also affecting patient's functional outcome.   REHAB POTENTIAL: Excellent  CLINICAL DECISION MAKING: Stable/uncomplicated  EVALUATION COMPLEXITY: Low  GOALS: Goals reviewed with patient? Yes  SHORT TERM GOALS: Target date: 08/13/2023  Pt will be independent in a HEP for  ROM,NTS, stabilization to improve left shoulder ROM Baseline: Goal status:MET  2.  Pt will be independent in abdominal treatment to reduce swelling Baseline:  Goal status: MET 3.  Pt will have appropriate compression garments for abdominal swelling prn Baseline:  Goal status: MET 08/18/2022 4.  Pt will have decreased abdominal pain at night no greater than 3/10 Baseline:  Goal status: MET 08/25/2022  5.  Pt will have left shoulder Flex and abd improved by atleast 15 degrees Baseline: 134 flex, 137 abd Goal status: MET flexion,  13 Degrees better with abd(10/28/22) 6.  Pt will have decreased pain/median nerve symptoms in left UE to no greater than 2/10 Baseline:  Goal status: Ongoing LONG TERM GOALS: Target date: 09/03/2023/ 10/31/2022  Pt will have Left shoulder ROM within 7 degrees of right shoulder ROM Baseline: 160 flex, 175 abd Goal status: MET flex 09/05/22 2.  Pt will have abdominal pain no greater than 1/10 at night to allow better sleep Baseline: MET Goal status: 3.  Median nerve symptoms in left UE will no longer be present Baseline:  Goal status:  improving,ONGOING 4.  Pt will be independent in self management of Left breast , and abdominal swelling, and shoulder stretching/strengthening Baseline:  Goal status:ONGOING    PLAN:  PT FREQUENCY: 1-2x/week  PT DURATION: 4 weeks PLANNED INTERVENTIONS: Therapeutic exercises, Therapeutic activity, Neuromuscular re-education, Patient/Family education, Self Care, Joint mobilization, Orthotic/Fit training, Dry Needling, Manual lymph drainage, scar mobilization, Vasopneumatic device, Manual therapy, and Re-evaluation  PLAN FOR NEXT SESSION: did she get new sleeves? Dry needling? Cont abdominal treatment (superficial and deep abdominals)review intact sequence,  Review left breast MLD, MFR to cording. Pt has a Flexi touch at home and uses it, check compression sleeves: gave pt script to purchase new sleeves. Assess ROM   Waynette Buttery, PT 10/28/2022, 5:24 PM

## 2022-11-03 ENCOUNTER — Ambulatory Visit: Payer: Federal, State, Local not specified - PPO | Attending: Obstetrics and Gynecology

## 2022-11-03 DIAGNOSIS — Z483 Aftercare following surgery for neoplasm: Secondary | ICD-10-CM

## 2022-11-03 DIAGNOSIS — R600 Localized edema: Secondary | ICD-10-CM

## 2022-11-03 DIAGNOSIS — I890 Lymphedema, not elsewhere classified: Secondary | ICD-10-CM

## 2022-11-03 DIAGNOSIS — M25612 Stiffness of left shoulder, not elsewhere classified: Secondary | ICD-10-CM

## 2022-11-03 NOTE — Therapy (Signed)
OUTPATIENT PHYSICAL THERAPY ONCOLOGY TREATMENT  Patient Name: Sheena Jarvis MRN: [id number] DOB:[birth year], 52 y.o., female Today's Date: 11/03/2022  END OF SESSION:  PT End of Session - 11/03/22 1500     Visit Number 13    Number of Visits 20    Date for PT Re-Evaluation 11/25/22    PT Start Time 1502    PT Stop Time 1551    PT Time Calculation (min) 49 min    Activity Tolerance Patient tolerated treatment well    Behavior During Therapy WFL for tasks assessed/performed             Past Medical History:  Diagnosis Date   Pneumonia    PONV (postoperative nausea and vomiting)    Vision abnormalities    Past Surgical History:  Procedure Laterality Date   APPENDECTOMY     BREAST BIOPSY Left 01/12/2019   [facility]   BREAST BIOPSY Left 12/21/2018   benign   BREAST CYST ASPIRATION Left    BREAST EXCISIONAL BIOPSY Left 06/22/2019   Axilla BX - Benign   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   HIP ARTHROSCOPY     MASS EXCISION Left 02/10/2019   Procedure: EXCISION OF LEFT BREAST MASS;  Surgeon: Almond Lint, MD;  Location: [facility];  Service: General;  Laterality: Left;   SCAR REVISION Left 06/22/2019   Procedure: LEFT AXILLARY CONTRACTURE RELEASE;  Surgeon: Almond Lint, MD;  Location: [facility];  Service: General;  Laterality: Left;   Patient Active Problem List   Diagnosis Date Noted   Numbness 02/04/2016   Cognitive changes 02/04/2016   Other fatigue 02/04/2016     REFERRING PROVIDER: Remi Deter MD  REFERRING DIAG: Lymphedema post Endometrial surgery  THERAPY DIAG:  Lymphedema, not elsewhere classified  Aftercare following surgery for neoplasm  Localized edema  Stiffness of left shoulder, not elsewhere classified  ONSET DATE: Mar 04, 2022  Rationale for Evaluation and Treatment: Rehabilitation  SUBJECTIVE:                                                                                                                                                                                            SUBJECTIVE STATEMENT: I think breast swelling has gotten some better but with the heat I hate to wear compression, and if I don't I feel the burning again. I never got my new sleeves. I leave for the [place] games this weekend. I have not used my Flexi touch yet, but I have done some MLD. Felt better after treatment last visit  PERTINENT HISTORY:  Pt had recent surgery for Stage  II Endometriosis on Oct 31,2024. She had a prior history of removal of benign breast mass on 02/10/2019 with complication of seroma and clogged drain and possible infection. She also developed lymphedema in her left breast and axillary cording. She receieved extensive PT with MLD, compression, manual therapy and dry needling, but had a significant persistent cord that was painful and limited shoulder ROM She underwent and incision of that contracted scar tissue on 06/22/2019 that included one node and develped a seroma after that.  PAIN:  Are you having pain? 3-4/10 burning pain at left breast today  PRECAUTIONS: Left UE lymphedema risk, abdominal swelling from Endometrial surgery/hysterectomy 03/05/2023  WEIGHT BEARING RESTRICTIONS: No  FALLS:  Has patient fallen in last 6 months? 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, integrative lifestyle medicine, pelvic floor, CE  LEISURE: yoga, anything outside, reading  HAND DOMINANCE: left   PRIOR LEVEL OF FUNCTION: Independent  PATIENT GOALS: differential dx, abdominal compression,cording release,   OBJECTIVE:  COGNITION: Overall cognitive status: Within functional limits for tasks assessed   PALPATION: Cording noted left axillary region running to incision, and upper arm  OBSERVATIONS / OTHER ASSESSMENTS: generalized left breast swelling;no enlarged pores or fibrotic areas  noted, limitations in left shoulder ROM and cording noted left axillary region into upper arm. Release techniques produce tingling in median nerve distribution of hand. Decreased left scapular mobility. No visible abdominal swelling this am, but pt notices fullness that becomes pitting later in the day in the central abdomen. No rebound tenderness today noted in any bilateral upper or lower quarters of abdomen. 3 well healed abdominal incisions.  SENSATION: Light touch: Appears intact    POSTURE: WNL  UPPER EXTREMITY AROM/PROM:  A/PROM RIGHT   eval   Shoulder extension   Shoulder flexion 160  Shoulder abduction 175  Shoulder internal rotation 50  Shoulder external rotation 115    (Blank rows = not tested)  A/PROM LEFT   eval LEFT 09/05/2022 LEFT 10/28/22  Shoulder extension     Shoulder flexion 134 158 155  Shoulder abduction 137 134 pain 150  Shoulder internal rotation 43    Shoulder external rotation 100      (Blank rows = not tested)       UPPER EXTREMITY STRENGTH: NT    LYMPHEDEMA ASSESSMENTS:   SURGERY TYPE/DATE: 02/10/2019 Removal of Benign Breast Mass, 06/22/2019 Excision of contracted scar , Endometriosis/hysterectomy surgery 03/04/2022  NUMBER OF LYMPH NODES REMOVED: 1 left axillary with excision of contracted scar  CHEMOTHERAPY: NO  RADIATION:NO  HORMONE TREATMENT: NO  INFECTIONS: YES (multiple seromas after breast surgery)  LYMPHEDEMA ASSESSMENTS:   LANDMARK RIGHT  eval  10 cm proximal to olecranon process 23.2  Olecranon process 21.7  10 cm proximal to ulnar styloid process 19.4  Just proximal to ulnar styloid process 14.6  Across hand at thumb web space 18.4  At base of 2nd digit 5.8  (Blank rows = not tested)  LANDMARK LEFT  eval  10 cm proximal to olecranon process 23.1  Olecranon process 21.6  10 cm proximal to ulnar styloid process 18.4  Just proximal to ulnar styloid process 14.5  Across hand at thumb web space 18.3  At base of  2nd digit 5.8  (Blank rows = not tested)      LLIS 43%  BREAST COMPLAINTS QUESTIONNAIRE Pain  4 Heaviness:5 Swollen feeling:5 Tense Skin:3 Redness:0 Bra Print:5 Size of  Pores:0 Hard feeling: 0 Total:   22  /80 A Score over 9 indicates lymphedema issues in the breast  TODAY'S TREATMENT:                                                                                                                                         DATE:  Pt permission and consent throughout each step of examination and treatment with modification and draping if requested when working on sensitive areas  11/03/2022 Scar mobilization left axilla MFR to left axilla ,Upper arm and forearm with arm resting on pillow to address cording at90 NS 120 degrees abduction with longitudinal technique MLD: In Supine: Short neck, superficial and deep abdominals, Lt inguinal nodes, Lt axillo-inguinal anastomosis, Rt axillary and pectoral nodes, Rt intact upper quadrant sequence, anterior inter-axillary anastomosis, then Lt breast reviewing with pt while especially  while performing the intact sequence. Added SL to posterior interaxillary anastomosis and axillo inguinal pathway. Reminded pt to purchase her new sleeves when she has the opportunity    10/28/22 Scar mobilization left axilla MFR to left axilla ,Upper arm and forearm with arm resting on pillow to address cording at120 degrees abduction with longitudinal and S technique MLD: In Supine: Short neck, superficial and deep abdominals, Lt inguinal nodes, Lt axillo-inguinal anastomosis, Rt axillary and pectoral nodes, Rt intact upper quadrant sequence, anterior inter-axillary anastomosis, then Lt breast reviewing with pt while performing and ending with LN's. Large area of induration and instructed pt to mobilize that before or during MLD. Made pt a new chip pack to place in bra over firm area. Discussed further air travel; pt does not fit well into a regular compression bra  like [place] because of small build but larger breast size. Discussed resilience bra as an option or possibly a custom Cami. Gave her picture of resilience bra to consider and she will look into it. Emailed [employer] to see if she measures for custom cami's.   10/03/2022 Scar mobilization left axilla MFR to left axilla ,Upper arm and forearm with arm resting on pillow to address cording at120 degrees abduction with longitudinal and S technique   09/22/2022 Scar mobilization left axilla Soft tissue mobilization left UT, lats, Teres, interscapular area with cupping to scapular area /lats/UT MFR to left axilla ,Upper arm and forearm with arm resting on pillow to address cording at 90 and 120 degrees abduction with longitudinal and S technique PROM left shoulder flexion, scaption,abduction   09/19/2022 Scar mobilization left axilla Soft tissue mobilization left UT, lats, Teres, interscapular area with cupping to scapular area /lats/UT MFR to left axilla ,Upper arm and forearm with arm resting on pillow to address cording at 90 and 120 degrees abduction with longitudinal and S technique PROM left shoulder flexion, scaption, IR/ER    09/08/2022 Scar mobilization left axilla Soft tissue mobilization left UT, lats, Teres, interscapular area with cupping to same MFR to left  axilla ,Upper arm and forearm with arm resting on pillow to address cording at 90 and 120 degrees abduction   09/05/2022 Gr 3/4 Left shoulder GH mobs posterior and inferior, Scar mobilization left axilla Soft tissue mobilization left UT, lats, Teres, interscapular area with cupping to same MFR to left axilla ,Upper arm and forearm with arm resting on pillow to address cording at 90 and 120 degrees abduction Measured AROM PATIENT EDUCATION:  Education details: Superficial and deep abdominals Person educated: Patient Education method: Explanation, demonstration, handout issued (drawn by therapist) Education comprehension:  verbalized understanding, returned demo and will benefit from further review  HOME EXERCISE PROGRAM: Superficial and deep abdominals, handout issued   ASSESSMENT:  CLINICAL IMPRESSION:  Left breast pain decreased to 2/10 after breast MLD today and not as burning as prior to MLD. Cording still noted and felt very tight at 90 deg abd today. Improving shoulder ROM today.  OBJECTIVE IMPAIRMENTS: decreased knowledge of condition, decreased ROM, hypomobility, increased edema, impaired UE functional use, and pain.   ACTIVITY LIMITATIONS: sleeping, bed mobility, and reach over head  PARTICIPATION LIMITATIONS:  social activities  PERSONAL FACTORS: 1-2 comorbidities: Breast and Left axillary surgery and recent abdominal surgery  are also affecting patient's functional outcome.   REHAB POTENTIAL: Excellent  CLINICAL DECISION MAKING: Stable/uncomplicated  EVALUATION COMPLEXITY: Low  GOALS: Goals reviewed with patient? Yes  SHORT TERM GOALS: Target date: 08/13/2023  Pt will be independent in a HEP for  ROM,NTS, stabilization to improve left shoulder ROM Baseline: Goal status:MET  2.  Pt will be independent in abdominal treatment to reduce swelling Baseline:  Goal status: MET 3.  Pt will have appropriate compression garments for abdominal swelling prn Baseline:  Goal status: MET 08/18/2022 4.  Pt will have decreased abdominal pain at night no greater than 3/10 Baseline:  Goal status: MET 08/25/2022  5.  Pt will have left shoulder Flex and abd improved by atleast 15 degrees Baseline: 134 flex, 137 abd Goal status: MET flexion,  13 Degrees better with abd(10/28/22) 6.  Pt will have decreased pain/median nerve symptoms in left UE to no greater than 2/10 Baseline:  Goal status: Ongoing LONG TERM GOALS: Target date: 09/03/2023/ 10/31/2022  Pt will have Left shoulder ROM within 7 degrees of right shoulder ROM Baseline: 160 flex, 175 abd Goal status: MET flex 09/05/22 2.  Pt will have  abdominal pain no greater than 1/10 at night to allow better sleep Baseline: MET Goal status: 3.  Median nerve symptoms in left UE will no longer be present Baseline:  Goal status: improving,ONGOING 4.  Pt will be independent in self management of Left breast , and abdominal swelling, and shoulder stretching/strengthening Baseline:  Goal status:ONGOING    PLAN:  PT FREQUENCY: 1-2x/week  PT DURATION: 4 weeks PLANNED INTERVENTIONS: Therapeutic exercises, Therapeutic activity, Neuromuscular re-education, Patient/Family education, Self Care, Joint mobilization, Orthotic/Fit training, Dry Needling, Manual lymph drainage, scar mobilization, Vasopneumatic device, Manual therapy, and Re-evaluation  PLAN FOR NEXT SESSION: did she get new sleeves? Dry needling? Cont abdominal treatment (superficial and deep abdominals)review intact sequence,  Review left breast MLD, MFR to cording. Pt has a Flexi touch at home and uses it, check compression sleeves: gave pt script to purchase new sleeves. Rejuvenate bra or custom cami? Assess ROM   Waynette Buttery, PT 11/03/2022, 3:53 PM

## 2022-11-10 DIAGNOSIS — F4312 Post-traumatic stress disorder, chronic: Secondary | ICD-10-CM | POA: Diagnosis not present

## 2022-11-24 DIAGNOSIS — F4312 Post-traumatic stress disorder, chronic: Secondary | ICD-10-CM | POA: Diagnosis not present

## 2022-11-25 ENCOUNTER — Ambulatory Visit: Payer: Federal, State, Local not specified - PPO

## 2022-12-01 DIAGNOSIS — F4312 Post-traumatic stress disorder, chronic: Secondary | ICD-10-CM | POA: Diagnosis not present

## 2022-12-02 ENCOUNTER — Ambulatory Visit: Payer: Federal, State, Local not specified - PPO | Admitting: Physical Therapy

## 2022-12-06 DIAGNOSIS — F4320 Adjustment disorder, unspecified: Secondary | ICD-10-CM | POA: Diagnosis not present

## 2022-12-08 DIAGNOSIS — F4312 Post-traumatic stress disorder, chronic: Secondary | ICD-10-CM | POA: Diagnosis not present

## 2022-12-22 ENCOUNTER — Ambulatory Visit: Payer: Federal, State, Local not specified - PPO | Attending: Obstetrics and Gynecology

## 2022-12-22 DIAGNOSIS — Z483 Aftercare following surgery for neoplasm: Secondary | ICD-10-CM | POA: Insufficient documentation

## 2022-12-22 DIAGNOSIS — I890 Lymphedema, not elsewhere classified: Secondary | ICD-10-CM | POA: Insufficient documentation

## 2022-12-22 DIAGNOSIS — M25612 Stiffness of left shoulder, not elsewhere classified: Secondary | ICD-10-CM | POA: Diagnosis not present

## 2022-12-22 DIAGNOSIS — F4312 Post-traumatic stress disorder, chronic: Secondary | ICD-10-CM | POA: Diagnosis not present

## 2022-12-22 DIAGNOSIS — R600 Localized edema: Secondary | ICD-10-CM | POA: Insufficient documentation

## 2022-12-22 NOTE — Therapy (Signed)
OUTPATIENT PHYSICAL THERAPY ONCOLOGY TREATMENT  Patient Name: Sheena Jarvis MRN: [id number] DOB:Jul 20, 1970, 52 y.o., female Today's Date: 12/22/2022  END OF SESSION:  PT End of Session - 12/22/22 0800     Visit Number 14    Number of Visits 22    Date for PT Re-Evaluation 01/19/23    PT Start Time 0801    PT Stop Time 0851    PT Time Calculation (min) 50 min    Activity Tolerance Patient tolerated treatment well    Behavior During Therapy [facility] for tasks assessed/performed             Past Medical History:  Diagnosis Date   Pneumonia    PONV (postoperative nausea and vomiting)    Vision abnormalities    Past Surgical History:  Procedure Laterality Date   APPENDECTOMY     BREAST BIOPSY Left 01/12/2019   Pash   BREAST BIOPSY Left 12/21/2018   benign   BREAST CYST ASPIRATION Left    BREAST EXCISIONAL BIOPSY Left 06/22/2019   Axilla BX - Benign   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   HIP ARTHROSCOPY     MASS EXCISION Left 02/10/2019   Procedure: EXCISION OF LEFT BREAST MASS;  Surgeon: Almond Lint, MD;  Location: [facility];  Service: General;  Laterality: Left;   SCAR REVISION Left 06/22/2019   Procedure: LEFT AXILLARY CONTRACTURE RELEASE;  Surgeon: Almond Lint, MD;  Location: [facility];  Service: General;  Laterality: Left;   Patient Active Problem List   Diagnosis Date Noted   Numbness 02/04/2016   Cognitive changes 02/04/2016   Other fatigue 02/04/2016     REFERRING PROVIDER: Remi Deter MD  REFERRING DIAG: Lymphedema post Endometrial surgery  THERAPY DIAG:  Lymphedema, not elsewhere classified  Aftercare following surgery for neoplasm  Localized edema  Stiffness of left shoulder, not elsewhere classified  ONSET DATE: Mar 04, 2022  Rationale for Evaluation and Treatment: Rehabilitation  SUBJECTIVE:                                                                                                                                                                                            SUBJECTIVE STATEMENT:  I was doing pretty well after I had the flare from traveling, and I woke up yesterday and everything on the left seemed to swell again. The left arm feels different and has a few imprints on it when I wake up.  I don't have any median nerve tension at rest now either, but I do feel it with movement. . The cording still stings and burns when  I protract to end range, but it is less noticeable and I am moving better.    PERTINENT HISTORY:  Pt had recent surgery for Stage II Endometriosis on Oct 31,2024. She had a prior history of removal of benign breast mass on 02/10/2019 with complication of seroma and clogged drain and possible infection. She also developed lymphedema in her left breast and axillary cording. She receieved extensive PT with MLD, compression, manual therapy and dry needling, but had a significant persistent cord that was painful and limited shoulder ROM She underwent and incision of that contracted scar tissue on 06/22/2019 that included one node and develped a seroma after that.  PAIN:  Are you having pain? 3-4/10 burning pain at left breast today  PRECAUTIONS: Left UE lymphedema risk, abdominal swelling from Endometrial surgery/hysterectomy 03/05/2023  WEIGHT BEARING RESTRICTIONS: No  FALLS:  Has patient fallen in last 6 months? 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, integrative lifestyle medicine, pelvic floor, CE  LEISURE: yoga, anything outside, reading  HAND DOMINANCE: left   PRIOR LEVEL OF FUNCTION: Independent  PATIENT GOALS: differential dx, abdominal compression,cording release,   OBJECTIVE:  COGNITION: Overall cognitive status: Within functional limits for tasks assessed   PALPATION: Cording noted left axillary region running to incision, and  upper arm  OBSERVATIONS / OTHER ASSESSMENTS: generalized left breast swelling;no enlarged pores or fibrotic areas noted, limitations in left shoulder ROM and cording noted left axillary region into upper arm. Release techniques produce tingling in median nerve distribution of hand. Decreased left scapular mobility. No visible abdominal swelling this am, but pt notices fullness that becomes pitting later in the day in the central abdomen. No rebound tenderness today noted in any bilateral upper or lower quarters of abdomen. 3 well healed abdominal incisions.  12/22/2022 Generalized left breast swelling returned, with more prominent veins noted. Several fibrotic areas noted. Still no enlarged pores in left breast.. No visible swelling in Left UE but pt reports arm feeling different and having impressions on it. Measurements taken for bilateral Ue's;see below   SENSATION: Light touch: Appears intact    POSTURE: WNL  UPPER EXTREMITY AROM/PROM:  A/PROM RIGHT   eval   Shoulder extension   Shoulder flexion 160  Shoulder abduction 175  Shoulder internal rotation 50  Shoulder external rotation 115    (Blank rows = not tested)  A/PROM LEFT   eval LEFT 09/05/2022 LEFT 10/28/22 LEFT 12/22/2022  Shoulder extension      Shoulder flexion 134 158 155 159  Shoulder abduction 137 134 pain 150 155  Shoulder internal rotation 43     Shoulder external rotation 100       (Blank rows = not tested)       UPPER EXTREMITY STRENGTH: NT    LYMPHEDEMA ASSESSMENTS:   SURGERY TYPE/DATE: 02/10/2019 Removal of Benign Breast Mass, 06/22/2019 Excision of contracted scar , Endometriosis/hysterectomy surgery 03/04/2022  NUMBER OF LYMPH NODES REMOVED: 1 left axillary with excision of contracted scar  CHEMOTHERAPY: NO  RADIATION:NO  HORMONE TREATMENT: NO  INFECTIONS: YES (multiple seromas after breast surgery)  LYMPHEDEMA ASSESSMENTS:   LANDMARK RIGHT  eval RIGHT 12/22/2022  axilla  25.0  10 cm  proximal to olecranon process 23.2 23.6  Olecranon process 21.7 21.7  10 cm proximal to ulnar styloid process 19.4 19.1  Just proximal to ulnar styloid process 14.6 14.4  Across hand at thumb web space 18.4 18.2  At base of 2nd digit 5.8 5.6  (Blank rows = not tested)  LANDMARK LEFT  eval LEFT 12/22/2022  axilla  25.0  10 cm proximal to olecranon process 23.1 22.8  Olecranon process 21.6 21.2  10 cm proximal to ulnar styloid process 18.4 18.4  Just proximal to ulnar styloid process 14.5 14.6  Across hand at thumb web space 18.3 18.2  At base of 2nd digit 5.8 5.8  (Blank rows = not tested)      LLIS 43%  BREAST COMPLAINTS QUESTIONNAIRE (eval) Pain  4 Heaviness:5 Swollen feeling:5 Tense Skin:3 Redness:0 Bra Print:5 Size of Pores:0 Hard feeling: 0 Total:   22  /80 A Score over 9 indicates lymphedema issues in the breast  TODAY'S TREATMENT:                                                                                                                                         DATE:  Pt permission and consent throughout each step of examination and treatment with modification and draping if requested when working on sensitive areas 12/22/2022 Assessed shoulder ROM and performed circumference measures MLD: In Supine: Short neck, superficial and deep abdominals, Lt inguinal nodes, Lt axillo-inguinal anastomosis, Rt axillary and pectoral nodes, Rt intact upper quadrant sequence, anterior inter-axillary anastomosis, then Left Breast, added Left arm lateral arm, medial to lateral and lateral again retracing pathways, and left forearm and briefly hand retracing steps and ending with LN's. Added SL to posterior interaxillary anastomosis at end. Script given to pt to purchase compression sleeve  11/03/2022 Scar mobilization left axilla MFR to left axilla ,Upper arm and forearm with arm resting on pillow to address cording at90 NS 120 degrees abduction with longitudinal technique MLD: In  Supine: Short neck, superficial and deep abdominals, Lt inguinal nodes, Lt axillo-inguinal anastomosis, Rt axillary and pectoral nodes, Rt intact upper quadrant sequence, anterior inter-axillary anastomosis, then Lt breast reviewing with pt while especially  while performing the intact sequence. Added SL to posterior interaxillary anastomosis and axillo inguinal pathway. Reminded pt to purchase her new sleeves when she has the opportunity    10/28/22 Scar mobilization left axilla MFR to left axilla ,Upper arm and forearm with arm resting on pillow to address cording at120 degrees abduction with longitudinal and S technique MLD: In Supine: Short neck, superficial and deep abdominals, Lt inguinal nodes, Lt axillo-inguinal anastomosis, Rt axillary and pectoral nodes, Rt intact upper quadrant sequence, anterior inter-axillary anastomosis, then Lt breast reviewing with pt while performing and ending with LN's. Large area of induration and instructed pt to mobilize that before or during MLD. Made pt a new chip pack to place in bra over firm area. Discussed further air travel; pt does not fit well into a regular compression bra like [place] because of small build but larger breast size. Discussed resilience bra as an option or possibly a custom Cami. Gave her picture  of resilience bra to consider and she will look into it. Emailed [employer] Pickle to see if she measures for custom cami's.   10/03/2022 Scar mobilization left axilla MFR to left axilla ,Upper arm and forearm with arm resting on pillow to address cording at120 degrees abduction with longitudinal and S technique   09/22/2022 Scar mobilization left axilla Soft tissue mobilization left UT, lats, Teres, interscapular area with cupping to scapular area /lats/UT MFR to left axilla ,Upper arm and forearm with arm resting on pillow to address cording at 90 and 120 degrees abduction with longitudinal and S technique PROM left shoulder flexion,  scaption,abduction   09/19/2022 Scar mobilization left axilla Soft tissue mobilization left UT, lats, Teres, interscapular area with cupping to scapular area /lats/UT MFR to left axilla ,Upper arm and forearm with arm resting on pillow to address cording at 90 and 120 degrees abduction with longitudinal and S technique PROM left shoulder flexion, scaption, IR/ER    09/08/2022 Scar mobilization left axilla Soft tissue mobilization left UT, lats, Teres, interscapular area with cupping to same MFR to left axilla ,Upper arm and forearm with arm resting on pillow to address cording at 90 and 120 degrees abduction   09/05/2022 Gr 3/4 Left shoulder GH mobs posterior and inferior, Scar mobilization left axilla Soft tissue mobilization left UT, lats, Teres, interscapular area with cupping to same MFR to left axilla ,Upper arm and forearm with arm resting on pillow to address cording at 90 and 120 degrees abduction Measured AROM PATIENT EDUCATION:  Education details: Superficial and deep abdominals Person educated: Patient Education method: Explanation, demonstration, handout issued (drawn by therapist) Education comprehension: verbalized understanding, returned demo and will benefit from further review  HOME EXERCISE PROGRAM: Superficial and deep abdominals, handout issued   ASSESSMENT:  CLINICAL IMPRESSION:  Pt has not been seen in therapy for over 6 weeks. After her initial Flare returning from [place] she had improved and this weekend after being in the mountains noted significant increase in left breast swelling and complaints of left UE swelling with an imprint noted at times on her arm.  There is considerable generalized left breast swelling greatest laterally with some firmness noted in several areas and increased vein visibility on left. No significant edema visualized in arm today, however, it does feel different to her. Cording is still present, but has improved as has her ROM.  Median nerve symptoms are no longer noted at rest. She will benefit from continued PT to reduce exacerbation of lymphedema, and to review self MLD techniques.  OBJECTIVE IMPAIRMENTS: decreased knowledge of condition, decreased ROM, hypomobility, increased edema, impaired UE functional use, and pain.   ACTIVITY LIMITATIONS: sleeping, bed mobility, and reach over head  PARTICIPATION LIMITATIONS:  social activities  PERSONAL FACTORS: 1-2 comorbidities: Breast and Left axillary surgery and recent abdominal surgery  are also affecting patient's functional outcome.   REHAB POTENTIAL: Excellent  CLINICAL DECISION MAKING: Stable/uncomplicated  EVALUATION COMPLEXITY: Low  GOALS: Goals reviewed with patient? Yes  SHORT TERM GOALS: Target date: 08/13/2023  Pt will be independent in a HEP for  ROM,NTS, stabilization to improve left shoulder ROM Baseline: Goal status:MET  2.  Pt will be independent in abdominal treatment to reduce swelling Baseline:  Goal status: MET 3.  Pt will have appropriate compression garments for abdominal swelling prn Baseline:  Goal status: MET 08/18/2022 4.  Pt will have decreased abdominal pain at night no greater than 3/10 Baseline:  Goal status: MET 08/25/2022  5.  Pt will have  left shoulder Flex and abd improved by atleast 15 degrees Baseline: 134 flex, 137 abd Goal status: MET flexion,  13 Degrees better with abd(10/28/22) 6.  Pt will have decreased pain/median nerve symptoms in left UE to no greater than 2/10 Baseline:  Goal status: MET at rest, ongoing with movement LONG TERM GOALS: Target date: 09/03/2023/ 10/31/2022  Pt will have Left shoulder ROM within 7 degrees of right shoulder ROM Baseline: 160 flex, 175 abd Goal status: MET flex 09/05/22 2.  Pt will have abdominal pain no greater than 1/10 at night to allow better sleep Baseline: MET Goal status: 3.  Median nerve symptoms in left UE will no longer be present Baseline:  Goal status:  improving,ONGOING 4.  Pt will be independent in self management of Left breast , and abdominal swelling, and shoulder stretching/strengthening Baseline:  Goal status:ONGOING    PLAN:  PT FREQUENCY: 1-2x/week  PT DURATION: 4 weeks PLANNED INTERVENTIONS: Therapeutic exercises, Therapeutic activity, Neuromuscular re-education, Patient/Family education, Self Care, Joint mobilization, Orthotic/Fit training, Dry Needling, Manual lymph drainage, scar mobilization, Vasopneumatic device, Manual therapy, and Re-evaluation  PLAN FOR NEXT SESSION: did she get new sleeve? Dry needling? Cont abdominal treatment (superficial and deep abdominals)review intact sequence,  Review left breast MLD, MFR to cording prn. Pt has a Flexi touch at home will resume using it, check compression sleeve: gave pt script to purchase new sleeve. Rejuvenate bra or custom cami?    Waynette Buttery, PT 12/22/2022, 1:01 PM

## 2022-12-23 ENCOUNTER — Other Ambulatory Visit: Payer: Self-pay | Admitting: Obstetrics and Gynecology

## 2022-12-23 DIAGNOSIS — N83299 Other ovarian cyst, unspecified side: Secondary | ICD-10-CM

## 2022-12-23 DIAGNOSIS — R102 Pelvic and perineal pain: Secondary | ICD-10-CM

## 2022-12-23 DIAGNOSIS — M4800 Spinal stenosis, site unspecified: Secondary | ICD-10-CM

## 2022-12-29 ENCOUNTER — Other Ambulatory Visit: Payer: Self-pay | Admitting: Medical Genetics

## 2022-12-29 ENCOUNTER — Ambulatory Visit
Admission: RE | Admit: 2022-12-29 | Discharge: 2022-12-29 | Disposition: A | Discharge status: Home / Self Care | Payer: Federal, State, Local not specified - PPO | Source: Ambulatory Visit | Attending: Obstetrics and Gynecology | Admitting: Obstetrics and Gynecology

## 2022-12-29 DIAGNOSIS — R102 Pelvic and perineal pain: Secondary | ICD-10-CM

## 2022-12-29 DIAGNOSIS — N83299 Other ovarian cyst, unspecified side: Secondary | ICD-10-CM

## 2022-12-29 DIAGNOSIS — F4312 Post-traumatic stress disorder, chronic: Secondary | ICD-10-CM | POA: Diagnosis not present

## 2022-12-29 DIAGNOSIS — Z006 Encounter for examination for normal comparison and control in clinical research program: Secondary | ICD-10-CM

## 2022-12-29 DIAGNOSIS — Z90710 Acquired absence of both cervix and uterus: Secondary | ICD-10-CM | POA: Diagnosis not present

## 2023-01-03 DIAGNOSIS — F4320 Adjustment disorder, unspecified: Secondary | ICD-10-CM | POA: Diagnosis not present

## 2023-01-06 ENCOUNTER — Ambulatory Visit: Payer: Federal, State, Local not specified - PPO

## 2023-01-12 DIAGNOSIS — F4312 Post-traumatic stress disorder, chronic: Secondary | ICD-10-CM | POA: Diagnosis not present

## 2023-01-12 DIAGNOSIS — M2021 Hallux rigidus, right foot: Secondary | ICD-10-CM | POA: Diagnosis not present

## 2023-01-12 DIAGNOSIS — M79671 Pain in right foot: Secondary | ICD-10-CM | POA: Diagnosis not present

## 2023-01-13 ENCOUNTER — Ambulatory Visit
Admission: RE | Admit: 2023-01-13 | Discharge: 2023-01-13 | Disposition: A | Discharge status: Home / Self Care | Payer: Federal, State, Local not specified - PPO | Source: Ambulatory Visit | Attending: Obstetrics and Gynecology | Admitting: Obstetrics and Gynecology

## 2023-01-13 ENCOUNTER — Ambulatory Visit: Payer: Federal, State, Local not specified - PPO | Attending: Obstetrics and Gynecology

## 2023-01-13 DIAGNOSIS — M25612 Stiffness of left shoulder, not elsewhere classified: Secondary | ICD-10-CM | POA: Insufficient documentation

## 2023-01-13 DIAGNOSIS — M4800 Spinal stenosis, site unspecified: Secondary | ICD-10-CM

## 2023-01-13 DIAGNOSIS — I890 Lymphedema, not elsewhere classified: Secondary | ICD-10-CM | POA: Diagnosis not present

## 2023-01-13 DIAGNOSIS — R600 Localized edema: Secondary | ICD-10-CM | POA: Diagnosis not present

## 2023-01-13 DIAGNOSIS — M5126 Other intervertebral disc displacement, lumbar region: Secondary | ICD-10-CM | POA: Diagnosis not present

## 2023-01-13 DIAGNOSIS — Z483 Aftercare following surgery for neoplasm: Secondary | ICD-10-CM | POA: Diagnosis not present

## 2023-01-13 MED ORDER — GADOPICLENOL 0.5 MMOL/ML IV SOLN
7.5000 mL | Freq: Once | INTRAVENOUS | Status: AC | PRN
  Administered 2023-01-13: 6 mL via INTRAVENOUS

## 2023-01-13 NOTE — Therapy (Signed)
OUTPATIENT PHYSICAL THERAPY ONCOLOGY TREATMENT  Patient Name: Sheena Jarvis MRN: [id number] DOB:[birth year], 52 y.o., female Today's Date: 01/13/2023  END OF SESSION:  PT End of Session - 01/13/23 1600     Visit Number 15    Number of Visits 22    Date for PT Re-Evaluation 01/19/23    PT Start Time 1603    PT Stop Time 1715    PT Time Calculation (min) 72 min    Activity Tolerance Patient tolerated treatment well    Behavior During Therapy WFL for tasks assessed/performed             Past Medical History:  Diagnosis Date   Pneumonia    PONV (postoperative nausea and vomiting)    Vision abnormalities    Past Surgical History:  Procedure Laterality Date   APPENDECTOMY     BREAST BIOPSY Left 01/12/2019   Pash   BREAST BIOPSY Left 12/21/2018   benign   BREAST CYST ASPIRATION Left    BREAST EXCISIONAL BIOPSY Left 06/22/2019   Axilla BX - Benign   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   HIP ARTHROSCOPY     MASS EXCISION Left 02/10/2019   Procedure: EXCISION OF LEFT BREAST MASS;  Surgeon: Almond Lint, MD;  Location: [facility];  Service: General;  Laterality: Left;   SCAR REVISION Left 06/22/2019   Procedure: LEFT AXILLARY CONTRACTURE RELEASE;  Surgeon: Almond Lint, MD;  Location: [facility];  Service: General;  Laterality: Left;   Patient Active Problem List   Diagnosis Date Noted   Numbness 02/04/2016   Cognitive changes 02/04/2016   Other fatigue 02/04/2016     REFERRING PROVIDER: Remi Deter MD  REFERRING DIAG: Lymphedema post Endometrial surgery  THERAPY DIAG:  Lymphedema, not elsewhere classified  Aftercare following surgery for neoplasm  Localized edema  Stiffness of left shoulder, not elsewhere classified  ONSET DATE: Mar 04, 2022  Rationale for Evaluation and Treatment: Rehabilitation  SUBJECTIVE:                                                                                                                                                                                            SUBJECTIVE STATEMENT:  It was worse after [place], then it got better, then it got worse for a few weeks, and now it seems to be improving. It is frustrating that I don't know why it is being exacerbated. I understood it when I flew to [place] and didn't have my compression. I have to wear swell spots in my bra, and they have to be moved around. Sometimes they make my  breast look square. Pt notes her schedule has been very busy requiring 4 pm appts which have been difficult to get.   PERTINENT HISTORY:  Pt had recent surgery for Stage II Endometriosis on Oct 31,2024. She had a prior history of removal of benign breast mass on 02/10/2019 with complication of seroma and clogged drain and possible infection. She also developed lymphedema in her left breast and axillary cording. She receieved extensive PT with MLD, compression, manual therapy and dry needling, but had a significant persistent cord that was painful and limited shoulder ROM She underwent and incision of that contracted scar tissue on 06/22/2019 that included one node and develped a seroma after that.  PAIN:  Are you having pain? 3-4/10 burning pain at left breast today  PRECAUTIONS: Left UE lymphedema risk, abdominal swelling from Endometrial surgery/hysterectomy 03/05/2023  WEIGHT BEARING RESTRICTIONS: No  FALLS:  Has patient fallen in last 6 months? 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, integrative lifestyle medicine, pelvic floor, CE  LEISURE: yoga, anything outside, reading  HAND DOMINANCE: left   PRIOR LEVEL OF FUNCTION: Independent  PATIENT GOALS: differential dx, abdominal compression,cording release,   OBJECTIVE:  COGNITION: Overall cognitive status: Within functional limits for tasks assessed   PALPATION: Cording noted  left axillary region running to incision, and upper arm  OBSERVATIONS / OTHER ASSESSMENTS: generalized left breast swelling;no enlarged pores or fibrotic areas noted, limitations in left shoulder ROM and cording noted left axillary region into upper arm. Release techniques produce tingling in median nerve distribution of hand. Decreased left scapular mobility. No visible abdominal swelling this am, but pt notices fullness that becomes pitting later in the day in the central abdomen. No rebound tenderness today noted in any bilateral upper or lower quarters of abdomen. 3 well healed abdominal incisions.  12/22/2022 Generalized left breast swelling returned, with more prominent veins noted. Several fibrotic areas noted. Still no enlarged pores in left breast.. No visible swelling in Left UE but pt reports arm feeling different and having impressions on it. Measurements taken for bilateral Ue's;see below   SENSATION: Light touch: Appears intact    POSTURE: WNL  UPPER EXTREMITY AROM/PROM:  A/PROM RIGHT   eval   Shoulder extension   Shoulder flexion 160  Shoulder abduction 175  Shoulder internal rotation 50  Shoulder external rotation 115    (Blank rows = not tested)  A/PROM LEFT   eval LEFT 09/05/2022 LEFT 10/28/22 LEFT 12/22/2022 LEFT 01/13/2023  Shoulder extension       Shoulder flexion 134 158 155 159   Shoulder abduction 137 134 pain 150 155   Shoulder internal rotation 43      Shoulder external rotation 100        (Blank rows = not tested)       UPPER EXTREMITY STRENGTH: NT    LYMPHEDEMA ASSESSMENTS:   SURGERY TYPE/DATE: 02/10/2019 Removal of Benign Breast Mass, 06/22/2019 Excision of contracted scar , Endometriosis/hysterectomy surgery 03/04/2022  NUMBER OF LYMPH NODES REMOVED: 1 left axillary with excision of contracted scar  CHEMOTHERAPY: NO  RADIATION:NO  HORMONE TREATMENT: NO  INFECTIONS: YES (multiple seromas after breast surgery)  LYMPHEDEMA  ASSESSMENTS:   LANDMARK RIGHT  eval RIGHT 12/22/2022  axilla  25.0  10 cm proximal to olecranon process 23.2 23.6  Olecranon process 21.7 21.7  10 cm proximal to ulnar styloid process 19.4 19.1  Just proximal to ulnar styloid  process 14.6 14.4  Across hand at thumb web space 18.4 18.2  At base of 2nd digit 5.8 5.6  (Blank rows = not tested)  LANDMARK LEFT  eval LEFT 12/22/2022  axilla  25.0  10 cm proximal to olecranon process 23.1 22.8  Olecranon process 21.6 21.2  10 cm proximal to ulnar styloid process 18.4 18.4  Just proximal to ulnar styloid process 14.5 14.6  Across hand at thumb web space 18.3 18.2  At base of 2nd digit 5.8 5.8  (Blank rows = not tested)      LLIS 43%  BREAST COMPLAINTS QUESTIONNAIRE (eval) Pain  4 Heaviness:5 Swollen feeling:5 Tense Skin:3 Redness:0 Bra Print:5 Size of Pores:0 Hard feeling: 0 Total:   22  /80 A Score over 9 indicates lymphedema issues in the breast  TODAY'S TREATMENT:                                                                                                                                         DATE:  Pt permission and consent throughout each step of examination and treatment with modification and draping if requested when working on sensitive areas 01/12/2023 Discussed nature of lymphedema that can wax and wane with humidity, pressure, activities etc and needs to be managed but we haven't found a great bra that fits her well. Gave the name of another bra that is a sports bra with good coverage that can be bought on [place], and rejuvenate bra that is adjustable at the lower band with velcro. Asked pt to bring her compression sleeve with her so we can check the fit, and decide the best option for a good sleeve since what she is trying to wear is uncomfortable for her and really causes her veins to be distended in her hand. Pts cording very visible and tight today with several noted. Performed release techniques to cording  with longitudinal and S techniques, and axillary scar mobilization.  MLD: In Supine: Short neck, superficial and deep abdominals, Lt inguinal nodes, Lt axillo-inguinal anastomosis, Rt axillary LN's and anterior inter-axillary anastomosis, then Left Breast, retracing pathways and added Left arm lateral arm, medial to lateral and lateral again retracing pathways and ending with LN's.   12/22/2022 Assessed shoulder ROM and performed circumference measures MLD: In Supine: Short neck, superficial and deep abdominals, Lt inguinal nodes, Lt axillo-inguinal anastomosis, Rt axillary and pectoral nodes, Rt intact upper quadrant sequence, anterior inter-axillary anastomosis, then Left Breast, added Left arm lateral arm, medial to lateral and lateral again retracing pathways, and left forearm and briefly hand retracing steps and ending with LN's. Added SL to posterior interaxillary anastomosis at end. Script given to pt to purchase compression sleeve  11/03/2022 Scar mobilization left axilla MFR to left axilla ,Upper arm and forearm with arm resting on pillow to address cording at90 NS 120 degrees abduction with longitudinal technique MLD: In Supine: Short neck, superficial and  deep abdominals, Lt inguinal nodes, Lt axillo-inguinal anastomosis, Rt axillary and pectoral nodes, Rt intact upper quadrant sequence, anterior inter-axillary anastomosis, then Lt breast reviewing with pt while especially  while performing the intact sequence. Added SL to posterior interaxillary anastomosis and axillo inguinal pathway. Reminded pt to purchase her new sleeves when she has the opportunity    10/28/22 Scar mobilization left axilla MFR to left axilla ,Upper arm and forearm with arm resting on pillow to address cording at120 degrees abduction with longitudinal and S technique MLD: In Supine: Short neck, superficial and deep abdominals, Lt inguinal nodes, Lt axillo-inguinal anastomosis, Rt axillary and pectoral nodes, Rt intact  upper quadrant sequence, anterior inter-axillary anastomosis, then Lt breast reviewing with pt while performing and ending with LN's. Large area of induration and instructed pt to mobilize that before or during MLD. Made pt a new chip pack to place in bra over firm area. Discussed further air travel; pt does not fit well into a regular compression bra like [place] because of small build but larger breast size. Discussed resilience bra as an option or possibly a custom Cami. Gave her picture of resilience bra to consider and she will look into it. Emailed [employer] Pickle to see if she measures for custom cami's.   10/03/2022 Scar mobilization left axilla MFR to left axilla ,Upper arm and forearm with arm resting on pillow to address cording at120 degrees abduction with longitudinal and S technique   09/22/2022 Scar mobilization left axilla Soft tissue mobilization left UT, lats, Teres, interscapular area with cupping to scapular area /lats/UT MFR to left axilla ,Upper arm and forearm with arm resting on pillow to address cording at 90 and 120 degrees abduction with longitudinal and S technique PROM left shoulder flexion, scaption,abduction   09/19/2022 Scar mobilization left axilla Soft tissue mobilization left UT, lats, Teres, interscapular area with cupping to scapular area /lats/UT MFR to left axilla ,Upper arm and forearm with arm resting on pillow to address cording at 90 and 120 degrees abduction with longitudinal and S technique PROM left shoulder flexion, scaption, IR/ER    09/08/2022 Scar mobilization left axilla Soft tissue mobilization left UT, lats, Teres, interscapular area with cupping to same MFR to left axilla ,Upper arm and forearm with arm resting on pillow to address cording at 90 and 120 degrees abduction   09/05/2022 Gr 3/4 Left shoulder GH mobs posterior and inferior, Scar mobilization left axilla Soft tissue mobilization left UT, lats, Teres, interscapular area with cupping  to same MFR to left axilla ,Upper arm and forearm with arm resting on pillow to address cording at 90 and 120 degrees abduction Measured AROM PATIENT EDUCATION:  Education details: Superficial and deep abdominals Person educated: Patient Education method: Explanation, demonstration, handout issued (drawn by therapist) Education comprehension: verbalized understanding, returned demo and will benefit from further review  HOME EXERCISE PROGRAM: Superficial and deep abdominals, handout issued   ASSESSMENT:  CLINICAL IMPRESSION:  Pt has not been seen in therapy for 4 weeks. After her initial Fl[place] returning from [place] she had improved and later  [place] noted significant increase in left breast swelling and complaints of left UE swelling with an imprint noted at times on her arm.  This recovered and then returned again several weeks ago with pt having difficulty managing. There is  generalized left breast swelling greatest laterally with some firmness noted in several areas . No significant edema visualized in arm today, however, it does feel different to her. Cording  is still present and more visible and palpable than when last seen with appearance of 2 cords now running into the left breast. She will benefit from continued PT to reduce exacerbation of lymphedema, and to review self MLD techniques.She was given the names of 2 different bra's to see if she can find something more suitable for her since the lower band size tends to be too large for her.  OBJECTIVE IMPAIRMENTS: decreased knowledge of condition, decreased ROM, hypomobility, increased edema, impaired UE functional use, and pain.   ACTIVITY LIMITATIONS: sleeping, bed mobility, and reach over head  PARTICIPATION LIMITATIONS:  social activities  PERSONAL FACTORS: 1-2 comorbidities: Breast and Left axillary surgery and recent abdominal surgery  are also affecting patient's functional outcome.   REHAB POTENTIAL:  Excellent  CLINICAL DECISION MAKING: Stable/uncomplicated  EVALUATION COMPLEXITY: Low  GOALS: Goals reviewed with patient? Yes  SHORT TERM GOALS: Target date: 08/13/2023  Pt will be independent in a HEP for  ROM,NTS, stabilization to improve left shoulder ROM Baseline: Goal status:MET  2.  Pt will be independent in abdominal treatment to reduce swelling Baseline:  Goal status: MET 3.  Pt will have appropriate compression garments for abdominal swelling prn Baseline:  Goal status: MET 08/18/2022 4.  Pt will have decreased abdominal pain at night no greater than 3/10 Baseline:  Goal status: MET 08/25/2022  5.  Pt will have left shoulder Flex and abd improved by atleast 15 degrees Baseline: 134 flex, 137 abd Goal status: MET flexion,  13 Degrees better with abd(10/28/22) 6.  Pt will have decreased pain/median nerve symptoms in left UE to no greater than 2/10 Baseline:  Goal status: MET at rest, ongoing with movement LONG TERM GOALS: Target date: 09/03/2023/ 10/31/2022  Pt will have Left shoulder ROM within 7 degrees of right shoulder ROM Baseline: 160 flex, 175 abd Goal status: MET flex 09/05/22 2.  Pt will have abdominal pain no greater than 1/10 at night to allow better sleep Baseline: MET Goal status: 3.  Median nerve symptoms in left UE will no longer be present Baseline:  Goal status: improving,ONGOING 4.  Pt will be independent in self management of Left breast , and abdominal swelling, and shoulder stretching/strengthening Baseline:  Goal status:ONGOING    PLAN:  PT FREQUENCY: 1-2x/week  PT DURATION: 4 weeks PLANNED INTERVENTIONS: Therapeutic exercises, Therapeutic activity, Neuromuscular re-education, Patient/Family education, Self Care, Joint mobilization, Orthotic/Fit training, Dry Needling, Manual lymph drainage, scar mobilization, Vasopneumatic device, Manual therapy, and Re-evaluation  PLAN FOR NEXT SESSION: did she get new sleeve? Dry needling? Cont abdominal  treatment (superficial and deep abdominals)review intact sequence,  Review left breast MLD, MFR to cording prn. Pt has a Flexi touch at home will resume using it, check compression sleeve: gave pt script to purchase new sleeve. Rejuvenate bra or custom cami?    Waynette Buttery, PT 01/13/2023, 5:17 PM

## 2023-01-19 DIAGNOSIS — F4312 Post-traumatic stress disorder, chronic: Secondary | ICD-10-CM | POA: Diagnosis not present

## 2023-01-23 ENCOUNTER — Ambulatory Visit: Payer: Federal, State, Local not specified - PPO

## 2023-01-23 DIAGNOSIS — I890 Lymphedema, not elsewhere classified: Secondary | ICD-10-CM | POA: Diagnosis not present

## 2023-01-23 DIAGNOSIS — R600 Localized edema: Secondary | ICD-10-CM | POA: Diagnosis not present

## 2023-01-23 DIAGNOSIS — Z483 Aftercare following surgery for neoplasm: Secondary | ICD-10-CM

## 2023-01-23 DIAGNOSIS — M25612 Stiffness of left shoulder, not elsewhere classified: Secondary | ICD-10-CM | POA: Diagnosis not present

## 2023-01-23 NOTE — Therapy (Signed)
OUTPATIENT PHYSICAL THERAPY ONCOLOGY TREATMENT  Patient Name: Sheena Jarvis MRN: [id number] DOB:December 14, 1970, 52 y.o., female Today's Date: 01/23/2023  END OF SESSION:  PT End of Session - 01/23/23 0801     Visit Number 16    Number of Visits 22    Date for PT Re-Evaluation 03/06/23    PT Start Time 0802    PT Stop Time 0859    PT Time Calculation (min) 57 min    Activity Tolerance Patient tolerated treatment well    Behavior During Therapy [facility] for tasks assessed/performed             Past Medical History:  Diagnosis Date   Pneumonia    PONV (postoperative nausea and vomiting)    Vision abnormalities    Past Surgical History:  Procedure Laterality Date   APPENDECTOMY     BREAST BIOPSY Left 01/12/2019   Pash   BREAST BIOPSY Left 12/21/2018   benign   BREAST CYST ASPIRATION Left    BREAST EXCISIONAL BIOPSY Left 06/22/2019   Axilla BX - Benign   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   HIP ARTHROSCOPY     MASS EXCISION Left 02/10/2019   Procedure: EXCISION OF LEFT BREAST MASS;  Surgeon: Almond Lint, MD;  Location: [facility];  Service: General;  Laterality: Left;   SCAR REVISION Left 06/22/2019   Procedure: LEFT AXILLARY CONTRACTURE RELEASE;  Surgeon: Almond Lint, MD;  Location: [facility];  Service: General;  Laterality: Left;   Patient Active Problem List   Diagnosis Date Noted   Numbness 02/04/2016   Cognitive changes 02/04/2016   Other fatigue 02/04/2016     REFERRING PROVIDER: Remi Deter MD  REFERRING DIAG: Lymphedema post Endometrial surgery  THERAPY DIAG:  Lymphedema, not elsewhere classified  Aftercare following surgery for neoplasm  Localized edema  Stiffness of left shoulder, not elsewhere classified  ONSET DATE: Mar 04, 2022  Rationale for Evaluation and Treatment: Rehabilitation  SUBJECTIVE:                                                                                                                                                                                            SUBJECTIVE STATEMENT: 01/23/2023  My axilla stings where the cords are and it stings and burns into the nipple. Swelling I think is as good as its going to get. I haven't had time to look for any other bras yet but hopefully I can this weekend..     01/13/2023 It was worse after [place], then it got better, then it got worse for a few weeks, and now it  seems to be improving. It is frustrating that I don't know why it is being exacerbated. I understood it when I flew to [place] and didn't have my compression. I have to wear swell spots in my bra, and they have to be moved around. Sometimes they make my breast look square. Pt notes her schedule has been very busy requiring 4 pm appts which have been difficult to get.   PERTINENT HISTORY:  Pt had recent surgery for Stage II Endometriosis on Oct 31,2024. She had a prior history of removal of benign breast mass on 02/10/2019 with complication of seroma and clogged drain and possible infection. She also developed lymphedema in her left breast and axillary cording. She receieved extensive PT with MLD, compression, manual therapy and dry needling, but had a significant persistent cord that was painful and limited shoulder ROM She underwent and incision of that contracted scar tissue on 06/22/2019 that included one node and develped a seroma after that.  PAIN:  Are you having pain? 3-4/10 burning pain at left breast today  PRECAUTIONS: Left UE lymphedema risk, abdominal swelling from Endometrial surgery/hysterectomy 03/05/2023  WEIGHT BEARING RESTRICTIONS: No  FALLS:  Has patient fallen in last 6 months? 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, integrative lifestyle medicine, pelvic floor, CE  LEISURE: yoga, anything outside, reading  HAND DOMINANCE: left    PRIOR LEVEL OF FUNCTION: Independent  PATIENT GOALS: differential dx, abdominal compression,cording release,   OBJECTIVE:  COGNITION: Overall cognitive status: Within functional limits for tasks assessed   PALPATION: Cording noted left axillary region running to incision, and upper arm  OBSERVATIONS / OTHER ASSESSMENTS: generalized left breast swelling;no enlarged pores or fibrotic areas noted, limitations in left shoulder ROM and cording noted left axillary region into upper arm. Release techniques produce tingling in median nerve distribution of hand. Decreased left scapular mobility. No visible abdominal swelling this am, but pt notices fullness that becomes pitting later in the day in the central abdomen. No rebound tenderness today noted in any bilateral upper or lower quarters of abdomen. 3 well healed abdominal incisions.  12/22/2022 Generalized left breast swelling returned, with more prominent veins noted. Several fibrotic areas noted. Still no enlarged pores in left breast.. No visible swelling in Left UE but pt reports arm feeling different and having impressions on it. Measurements taken for bilateral Ue's;see below   SENSATION: Light touch: Appears intact    POSTURE: WNL  UPPER EXTREMITY AROM/PROM:  A/PROM RIGHT   eval   Shoulder extension   Shoulder flexion 160  Shoulder abduction 175  Shoulder internal rotation 50  Shoulder external rotation 115    (Blank rows = not tested)  A/PROM LEFT   eval LEFT 09/05/2022 LEFT 10/28/22 LEFT 12/22/2022 LEFT 01/23/2023  Shoulder extension       Shoulder flexion 134 158 155 159 160  Shoulder abduction 137 134 pain 150 155 145, cord and AC joint  Shoulder internal rotation 43      Shoulder external rotation 100        (Blank rows = not tested)       UPPER EXTREMITY STRENGTH: NT    LYMPHEDEMA ASSESSMENTS:   SURGERY TYPE/DATE: 02/10/2019 Removal of Benign Breast Mass, 06/22/2019 Excision of contracted scar ,  Endometriosis/hysterectomy surgery 03/04/2022  NUMBER OF LYMPH NODES REMOVED: 1 left axillary with excision of contracted scar  CHEMOTHERAPY: NO  RADIATION:NO  HORMONE TREATMENT: NO  INFECTIONS: YES (multiple seromas after breast surgery)  LYMPHEDEMA ASSESSMENTS:   LANDMARK RIGHT  eval RIGHT 12/22/2022  axilla  25.0  10 cm proximal to olecranon process 23.2 23.6  Olecranon process 21.7 21.7  10 cm proximal to ulnar styloid process 19.4 19.1  Just proximal to ulnar styloid process 14.6 14.4  Across hand at thumb web space 18.4 18.2  At base of 2nd digit 5.8 5.6  (Blank rows = not tested)  LANDMARK LEFT  eval LEFT 12/22/2022  axilla  25.0  10 cm proximal to olecranon process 23.1 22.8  Olecranon process 21.6 21.2  10 cm proximal to ulnar styloid process 18.4 18.4  Just proximal to ulnar styloid process 14.5 14.6  Across hand at thumb web space 18.3 18.2  At base of 2nd digit 5.8 5.8  (Blank rows = not tested)      LLIS 43%  BREAST COMPLAINTS QUESTIONNAIRE (eval) Pain  4 Heaviness:5 Swollen feeling:5 Tense Skin:3 Redness:0 Bra Print:5 Size of Pores:0 Hard feeling: 0 Total:   22  /80 A Score over 9 indicates lymphedema issues in the breast  TODAY'S TREATMENT:                                                                                                                                         DATE:  Pt permission and consent throughout each step of examination and treatment with modification and draping if requested when working on sensitive areas 01/23/2023 Scar mobilization left axilla MFR to left  lateral breast, axilla ,Upper arm with arm resting on pillow to address cording at 120 degrees abduction with longitudinal technique, S technique MLD: In Supine: Short neck, superficial and deep abdominals, Lt inguinal nodes, Lt axillo-inguinal anastomosis, Rt axillary and pectoral nodes, Right anterior inter-axillary anastomosis, then Lt breast, repeating pathways,  and left upper arm repeating pathways and ending with LN's. Pt experienced burning pain into breast after MFR techniques Checked left AROM  01/12/2023 Discussed nature of lymphedema that can wax and wane with humidity, pressure, activities etc and needs to be managed but we haven't found a great bra that fits her well. Gave the name of another bra that is a sports bra with good coverage that can be bought on [place], and rejuvenate bra that is adjustable at the lower band with velcro. Asked pt to bring her compression sleeve with her so we can check the fit, and decide the best option for a good sleeve since what she is trying to wear is uncomfortable for her and really causes her veins to be distended in her hand. Pts cording very visible and tight today with several noted. Performed release techniques to cording with longitudinal and S techniques, and axillary scar mobilization.  MLD: In Supine: Short neck, superficial and deep abdominals, Lt inguinal nodes, Lt axillo-inguinal anastomosis, Rt axillary LN's and anterior inter-axillary anastomosis, then Left Breast, retracing pathways and added  Left arm lateral arm, medial to lateral and lateral again retracing pathways and ending with LN's.   12/22/2022 Assessed shoulder ROM and performed circumference measures MLD: In Supine: Short neck, superficial and deep abdominals, Lt inguinal nodes, Lt axillo-inguinal anastomosis, Rt axillary and pectoral nodes, Rt intact upper quadrant sequence, anterior inter-axillary anastomosis, then Left Breast, added Left arm lateral arm, medial to lateral and lateral again retracing pathways, and left forearm and briefly hand retracing steps and ending with LN's. Added SL to posterior interaxillary anastomosis at end. Script given to pt to purchase compression sleeve  11/03/2022 Scar mobilization left axilla MFR to left axilla ,Upper arm and forearm with arm resting on pillow to address cording at90 NS 120 degrees abduction  with longitudinal technique MLD: In Supine: Short neck, superficial and deep abdominals, Lt inguinal nodes, Lt axillo-inguinal anastomosis, Rt axillary and pectoral nodes, Rt intact upper quadrant sequence, anterior inter-axillary anastomosis, then Lt breast reviewing with pt while especially  while performing the intact sequence. Added SL to posterior interaxillary anastomosis and axillo inguinal pathway. Reminded pt to purchase her new sleeves when she has the opportunity    10/28/22 Scar mobilization left axilla MFR to left axilla ,Upper arm and forearm with arm resting on pillow to address cording at120 degrees abduction with longitudinal and S technique MLD: In Supine: Short neck, superficial and deep abdominals, Lt inguinal nodes, Lt axillo-inguinal anastomosis, Rt axillary and pectoral nodes, Rt intact upper quadrant sequence, anterior inter-axillary anastomosis, then Lt breast reviewing with pt while performing and ending with LN's. Large area of induration and instructed pt to mobilize that before or during MLD. Made pt a new chip pack to place in bra over firm area. Discussed further air travel; pt does not fit well into a regular compression bra like [place] because of small build but larger breast size. Discussed resilience bra as an option or possibly a custom Cami. Gave her picture of resilience bra to consider and she will look into it. Emailed [employer] Pickle to see if she measures for custom cami's.   10/03/2022 Scar mobilization left axilla MFR to left axilla ,Upper arm and forearm with arm resting on pillow to address cording at120 degrees abduction with longitudinal and S technique   09/22/2022 Scar mobilization left axilla Soft tissue mobilization left UT, lats, Teres, interscapular area with cupping to scapular area /lats/UT MFR to left axilla ,Upper arm and forearm with arm resting on pillow to address cording at 90 and 120 degrees abduction with longitudinal and S technique PROM  left shoulder flexion, scaption,abduction   09/19/2022 Scar mobilization left axilla Soft tissue mobilization left UT, lats, Teres, interscapular area with cupping to scapular area /lats/UT MFR to left axilla ,Upper arm and forearm with arm resting on pillow to address cording at 90 and 120 degrees abduction with longitudinal and S technique PROM left shoulder flexion, scaption, IR/ER    09/08/2022 Scar mobilization left axilla Soft tissue mobilization left UT, lats, Teres, interscapular area with cupping to same MFR to left axilla ,Upper arm and forearm with arm resting on pillow to address cording at 90 and 120 degrees abduction   09/05/2022 Gr 3/4 Left shoulder GH mobs posterior and inferior, Scar mobilization left axilla Soft tissue mobilization left UT, lats, Teres, interscapular area with cupping to same MFR to left axilla ,Upper arm and forearm with arm resting on pillow to address cording at 90 and 120 degrees abduction Measured AROM PATIENT EDUCATION:  Education details: Superficial and deep abdominals Person educated: Patient  Education method: Explanation, demonstration, handout issued (drawn by therapist) Education comprehension: verbalized understanding, returned demo and will benefit from further review  HOME EXERCISE PROGRAM: Superficial and deep abdominals, handout issued   ASSESSMENT:  CLINICAL IMPRESSION:  Pt had not been seen in therapy for 4 weeks until last visit. After her initial Fl[place] returning from [place] she had improved and later  [place] noted significant increase in left breast swelling and complaints of left UE swelling with an imprint noted at times on her arm.  This recovered and then returned again several weeks ago with pt having difficulty managing. There is  generalized left breast swelling greatest laterally with some firmness noted in several areas which has improved over the last week . No significant edema visualized in arm  today, however, it does feel different to her. Cording is still present and  and slightly less visible and palpable than last week.  Several cords still running into the left breast and causing burning pain at the lateral breast and axilla.  Shoulder ROM continues to be limited by [facility] pain and cording.She will benefit from continued PT to reduce exacerbation of lymphedema, and to review self MLD techniques.She was given the names of 2 different bra's to see if she can find something more suitable for her since the lower band size tends to be too large for her.  OBJECTIVE IMPAIRMENTS: decreased knowledge of condition, decreased ROM, hypomobility, increased edema, impaired UE functional use, and pain.   ACTIVITY LIMITATIONS: sleeping, bed mobility, and reach over head  PARTICIPATION LIMITATIONS:  social activities  PERSONAL FACTORS: 1-2 comorbidities: Breast and Left axillary surgery and recent abdominal surgery  are also affecting patient's functional outcome.   REHAB POTENTIAL: Excellent  CLINICAL DECISION MAKING: Stable/uncomplicated  EVALUATION COMPLEXITY: Low  GOALS: Goals reviewed with patient? Yes  SHORT TERM GOALS: Target date: 08/13/2023  Pt will be independent in a HEP for  ROM,NTS, stabilization to improve left shoulder ROM Baseline: Goal status:MET  2.  Pt will be independent in abdominal treatment to reduce swelling Baseline:  Goal status: MET 3.  Pt will have appropriate compression garments for abdominal swelling prn Baseline:  Goal status: MET 08/18/2022 4.  Pt will have decreased abdominal pain at night no greater than 3/10 Baseline:  Goal status: MET 08/25/2022  5.  Pt will have left shoulder Flex and abd improved by atleast 15 degrees Baseline: 134 flex, 137 abd Goal status: MET flexion,  13 Degrees better with abd(10/28/22) 6.  Pt will have decreased pain/median nerve symptoms in left UE to no greater than 2/10 Baseline:  Goal status: MET at rest, ongoing with  movement LONG TERM GOALS: Target date: 09/03/2023/ 10/31/2022  Pt will have Left shoulder ROM within 7 degrees of right shoulder ROM Baseline: 160 flex, 175 abd Goal status: MET flex 09/05/22 2.  Pt will have abdominal pain no greater than 1/10 at night to allow better sleep Baseline: MET Goal status: 3.  Median nerve symptoms in left UE will no longer be present Baseline:  Goal status: improving,ONGOING 4.  Pt will be independent in self management of Left breast , and abdominal swelling, and shoulder stretching/strengthening Baseline:  Goal status:ONGOING    PLAN:  PT FREQUENCY: 1 x/week  PT DURATION: 6 weeks  PLANNED INTERVENTIONS: Therapeutic exercises, Therapeutic activity, Neuromuscular re-education, Patient/Family education, Self Care, Joint mobilization, Orthotic/Fit training, Dry Needling, Manual lymph drainage, scar mobilization, Vasopneumatic device, Manual therapy, and Re-evaluation  PLAN FOR NEXT SESSION: did  she get new sleeve? Dry needling? Cont abdominal treatment (superficial and deep abdominals)review intact sequence,  Review left breast MLD, MFR to cording prn. Pt has a Flexi touch at home will resume using it, check compression sleeve: gave pt script to purchase new sleeve. Rejuvenate bra or custom cami?    Waynette Buttery, PT 01/23/2023, 9:03 AM

## 2023-01-26 DIAGNOSIS — F4312 Post-traumatic stress disorder, chronic: Secondary | ICD-10-CM | POA: Diagnosis not present

## 2023-01-27 ENCOUNTER — Ambulatory Visit: Payer: Federal, State, Local not specified - PPO

## 2023-01-27 DIAGNOSIS — M25612 Stiffness of left shoulder, not elsewhere classified: Secondary | ICD-10-CM

## 2023-01-27 DIAGNOSIS — Z483 Aftercare following surgery for neoplasm: Secondary | ICD-10-CM | POA: Diagnosis not present

## 2023-01-27 DIAGNOSIS — R600 Localized edema: Secondary | ICD-10-CM

## 2023-01-27 DIAGNOSIS — I890 Lymphedema, not elsewhere classified: Secondary | ICD-10-CM

## 2023-01-27 NOTE — Therapy (Addendum)
 OUTPATIENT PHYSICAL THERAPY ONCOLOGY TREATMENT  Patient Name: Sheena Jarvis MRN: [id number] DOB:[birth year], 52 y.o., female Today's Date: 01/27/2023  END OF SESSION:  PT End of Session - 01/27/23 1600     Visit Number 17    Number of Visits 22    Date for PT Re-Evaluation 03/06/23    PT Start Time 1601    PT Stop Time 1656    PT Time Calculation (min) 55 min    Activity Tolerance Patient tolerated treatment well    Behavior During Therapy WFL for tasks assessed/performed             Past Medical History:  Diagnosis Date   Pneumonia    PONV (postoperative nausea and vomiting)    Vision abnormalities    Past Surgical History:  Procedure Laterality Date   APPENDECTOMY     BREAST BIOPSY Left 01/12/2019   Pash   BREAST BIOPSY Left 12/21/2018   benign   BREAST CYST ASPIRATION Left    BREAST EXCISIONAL BIOPSY Left 06/22/2019   Axilla BX - Benign   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   HIP ARTHROSCOPY     MASS EXCISION Left 02/10/2019   Procedure: EXCISION OF LEFT BREAST MASS;  Surgeon: Almond Lint, MD;  Location: [facility];  Service: General;  Laterality: Left;   SCAR REVISION Left 06/22/2019   Procedure: LEFT AXILLARY CONTRACTURE RELEASE;  Surgeon: Almond Lint, MD;  Location: [facility];  Service: General;  Laterality: Left;   Patient Active Problem List   Diagnosis Date Noted   Numbness 02/04/2016   Cognitive changes 02/04/2016   Other fatigue 02/04/2016     REFERRING PROVIDER: Remi Deter MD  REFERRING DIAG: Lymphedema post Endometrial surgery  THERAPY DIAG:  Lymphedema, not elsewhere classified  Aftercare following surgery for neoplasm  Localized edema  Stiffness of left shoulder, not elsewhere classified  ONSET DATE: Mar 04, 2022  Rationale for Evaluation and Treatment: Rehabilitation  SUBJECTIVE:                                                                                                                                                                                            SUBJECTIVE STATEMENT:   01/27/2023 My axilla burned for a day after I was here but then it was better overall.I can protract better now.  I have gone back to exercising again. This is the most even it has been for a long time. The breast swelling is better too. I feel like there may be a cord back closer to my scapula. I have [employer].  01/13/2023 It  was worse after [place], then it got better, then it got worse for a few weeks, and now it seems to be improving. It is frustrating that I don't know why it is being exacerbated. I understood it when I flew to [place] and didn't have my compression. I have to wear swell spots in my bra, and they have to be moved around. Sometimes they make my breast look square. Pt notes her schedule has been very busy requiring 4 pm appts which have been difficult to get.   PERTINENT HISTORY:  Pt had recent surgery for Stage II Endometriosis on Oct 31,2024. She had a prior history of removal of benign breast mass on 02/10/2019 with complication of seroma and clogged drain and possible infection. She also developed lymphedema in her left breast and axillary cording. She receieved extensive PT with MLD, compression, manual therapy and dry needling, but had a significant persistent cord that was painful and limited shoulder ROM She underwent and incision of that contracted scar tissue on 06/22/2019 that included one node and develped a seroma after that.  PAIN:  Are you having pain? No PRECAUTIONS: Left UE lymphedema risk, abdominal swelling from Endometrial surgery/hysterectomy 03/05/2023  WEIGHT BEARING RESTRICTIONS: No  FALLS:  Has patient fallen in last 6 months? 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, integrative lifestyle medicine, pelvic floor, CE  LEISURE:  yoga, anything outside, reading  HAND DOMINANCE: left   PRIOR LEVEL OF FUNCTION: Independent  PATIENT GOALS: differential dx, abdominal compression,cording release,   OBJECTIVE:  COGNITION: Overall cognitive status: Within functional limits for tasks assessed   PALPATION: Cording noted left axillary region running to incision, and upper arm  OBSERVATIONS / OTHER ASSESSMENTS: generalized left breast swelling;no enlarged pores or fibrotic areas noted, limitations in left shoulder ROM and cording noted left axillary region into upper arm. Release techniques produce tingling in median nerve distribution of hand. Decreased left scapular mobility. No visible abdominal swelling this am, but pt notices fullness that becomes pitting later in the day in the central abdomen. No rebound tenderness today noted in any bilateral upper or lower quarters of abdomen. 3 well healed abdominal incisions.  12/22/2022 Generalized left breast swelling returned, with more prominent veins noted. Several fibrotic areas noted. Still no enlarged pores in left breast.. No visible swelling in Left UE but pt reports arm feeling different and having impressions on it. Measurements taken for bilateral Ue's;see below   SENSATION: Light touch: Appears intact    POSTURE: WNL  UPPER EXTREMITY AROM/PROM:  A/PROM RIGHT   eval   Shoulder extension   Shoulder flexion 160  Shoulder abduction 175  Shoulder internal rotation 50  Shoulder external rotation 115    (Blank rows = not tested)  A/PROM LEFT   eval LEFT 09/05/2022 LEFT 10/28/22 LEFT 12/22/2022 LEFT 01/23/2023  Shoulder extension       Shoulder flexion 134 158 155 159 160  Shoulder abduction 137 134 pain 150 155 145, cord and AC joint  Shoulder internal rotation 43      Shoulder external rotation 100        (Blank rows = not tested)       UPPER EXTREMITY STRENGTH: NT    LYMPHEDEMA ASSESSMENTS:   SURGERY TYPE/DATE: 02/10/2019 Removal of Benign  Breast Mass, 06/22/2019 Excision of contracted scar , Endometriosis/hysterectomy surgery 03/04/2022  NUMBER OF LYMPH NODES REMOVED: 1 left axillary with excision  of contracted scar  CHEMOTHERAPY: NO  RADIATION:NO  HORMONE TREATMENT: NO  INFECTIONS: YES (multiple seromas after breast surgery)  LYMPHEDEMA ASSESSMENTS:   LANDMARK RIGHT  eval RIGHT 12/22/2022  axilla  25.0  10 cm proximal to olecranon process 23.2 23.6  Olecranon process 21.7 21.7  10 cm proximal to ulnar styloid process 19.4 19.1  Just proximal to ulnar styloid process 14.6 14.4  Across hand at thumb web space 18.4 18.2  At base of 2nd digit 5.8 5.6  (Blank rows = not tested)  LANDMARK LEFT  eval LEFT 12/22/2022  axilla  25.0  10 cm proximal to olecranon process 23.1 22.8  Olecranon process 21.6 21.2  10 cm proximal to ulnar styloid process 18.4 18.4  Just proximal to ulnar styloid process 14.5 14.6  Across hand at thumb web space 18.3 18.2  At base of 2nd digit 5.8 5.8  (Blank rows = not tested)      LLIS 43%  BREAST COMPLAINTS QUESTIONNAIRE (eval) Pain  4 Heaviness:5 Swollen feeling:5 Tense Skin:3 Redness:0 Bra Print:5 Size of Pores:0 Hard feeling: 0 Total:   22  /80 A Score over 9 indicates lymphedema issues in the breast  TODAY'S TREATMENT:                                                                                                                                         DATE:  Pt permission and consent throughout each step of examination and treatment with modification and draping if requested when working on sensitive areas 01/27/2023 Scar mobilization left axilla MFR to left  lateral breast, axilla ,Upper arm with arm resting on pillow to address cording at 120 degrees abduction with longitudinal technique, S technique MLD: In Supine: Short neck, superficial and deep abdominals, Lt inguinal nodes, Lt axillo-inguinal anastomosis, Rt axillary and pectoral nodes, Right anterior  inter-axillary anastomosis, then Lt breast, repeating pathways, and left upper arm repeating pathways and ending with LN's.  01/23/2023 Scar mobilization left axilla MFR to left  lateral breast, axilla ,Upper arm with arm resting on pillow to address cording at 120 degrees abduction with longitudinal technique, S technique MLD: In Supine: Short neck, superficial and deep abdominals, Lt inguinal nodes, Lt axillo-inguinal anastomosis, Rt axillary and pectoral nodes, Right anterior inter-axillary anastomosis, then Lt breast, repeating pathways, and left upper arm repeating pathways and ending with LN's. Pt experienced burning pain into breast after MFR techniques Checked left AROM  01/12/2023 Discussed nature of lymphedema that can wax and wane with humidity, pressure, activities etc and needs to be managed but we haven't found a great bra that fits her well. Gave the name of another bra that is a sports bra with good coverage that can be bought on [place], and rejuvenate bra that is adjustable at the lower band with velcro. Asked pt to bring her compression sleeve with her so we can check the fit, and  decide the best option for a good sleeve since what she is trying to wear is uncomfortable for her and really causes her veins to be distended in her hand. Pts cording very visible and tight today with several noted. Performed release techniques to cording with longitudinal and S techniques, and axillary scar mobilization.  MLD: In Supine: Short neck, superficial and deep abdominals, Lt inguinal nodes, Lt axillo-inguinal anastomosis, Rt axillary LN's and anterior inter-axillary anastomosis, then Left Breast, retracing pathways and added Left arm lateral arm, medial to lateral and lateral again retracing pathways and ending with LN's.   12/22/2022 Assessed shoulder ROM and performed circumference measures MLD: In Supine: Short neck, superficial and deep abdominals, Lt inguinal nodes, Lt axillo-inguinal  anastomosis, Rt axillary and pectoral nodes, Rt intact upper quadrant sequence, anterior inter-axillary anastomosis, then Left Breast, added Left arm lateral arm, medial to lateral and lateral again retracing pathways, and left forearm and briefly hand retracing steps and ending with LN's. Added SL to posterior interaxillary anastomosis at end. Script given to pt to purchase compression sleeve  11/03/2022 Scar mobilization left axilla MFR to left axilla ,Upper arm and forearm with arm resting on pillow to address cording at90 NS 120 degrees abduction with longitudinal technique MLD: In Supine: Short neck, superficial and deep abdominals, Lt inguinal nodes, Lt axillo-inguinal anastomosis, Rt axillary and pectoral nodes, Rt intact upper quadrant sequence, anterior inter-axillary anastomosis, then Lt breast reviewing with pt while especially  while performing the intact sequence. Added SL to posterior interaxillary anastomosis and axillo inguinal pathway. Reminded pt to purchase her new sleeves when she has the opportunity    10/28/22 Scar mobilization left axilla MFR to left axilla ,Upper arm and forearm with arm resting on pillow to address cording at120 degrees abduction with longitudinal and S technique MLD: In Supine: Short neck, superficial and deep abdominals, Lt inguinal nodes, Lt axillo-inguinal anastomosis, Rt axillary and pectoral nodes, Rt intact upper quadrant sequence, anterior inter-axillary anastomosis, then Lt breast reviewing with pt while performing and ending with LN's. Large area of induration and instructed pt to mobilize that before or during MLD. Made pt a new chip pack to place in bra over firm area. Discussed further air travel; pt does not fit well into a regular compression bra like [place] because of small build but larger breast size. Discussed resilience bra as an option or possibly a custom Cami. Gave her picture of resilience bra to consider and she will look into  it. Emailed [employer] Pickle to see if she measures for custom cami's.   10/03/2022 Scar mobilization left axilla MFR to left axilla ,Upper arm and forearm with arm resting on pillow to address cording at120 degrees abduction with longitudinal and S technique   09/22/2022 Scar mobilization left axilla Soft tissue mobilization left UT, lats, Teres, interscapular area with cupping to scapular area /lats/UT MFR to left axilla ,Upper arm and forearm with arm resting on pillow to address cording at 90 and 120 degrees abduction with longitudinal and S technique PROM left shoulder flexion, scaption,abduction   09/19/2022 Scar mobilization left axilla Soft tissue mobilization left UT, lats, Teres, interscapular area with cupping to scapular area /lats/UT MFR to left axilla ,Upper arm and forearm with arm resting on pillow to address cording at 90 and 120 degrees abduction with longitudinal and S technique PROM left shoulder flexion, scaption, IR/ER    09/08/2022 Scar mobilization left axilla Soft tissue mobilization left UT, lats, Teres, interscapular area with cupping to same MFR to left  axilla ,Upper arm and forearm with arm resting on pillow to address cording at 90 and 120 degrees abduction   09/05/2022 Gr 3/4 Left shoulder GH mobs posterior and inferior, Scar mobilization left axilla Soft tissue mobilization left UT, lats, Teres, interscapular area with cupping to same MFR to left axilla ,Upper arm and forearm with arm resting on pillow to address cording at 90 and 120 degrees abduction Measured AROM PATIENT EDUCATION:  Education details: Superficial and deep abdominals Person educated: Patient Education method: Explanation, demonstration, handout issued (drawn by therapist) Education comprehension: verbalized understanding, returned demo and will benefit from further review  HOME EXERCISE PROGRAM: Superficial and deep abdominals, handout issued   ASSESSMENT:  CLINICAL IMPRESSION: Pt had  left axillary burning for a day after she was here last, but now feels she is the best she has been in a long time. No burning noted in axilla today at completion of rx and swelling overall better. Cording still palpable but not producing burning today.   OBJECTIVE IMPAIRMENTS: decreased knowledge of condition, decreased ROM, hypomobility, increased edema, impaired UE functional use, and pain.   ACTIVITY LIMITATIONS: sleeping, bed mobility, and reach over head  PARTICIPATION LIMITATIONS:  social activities  PERSONAL FACTORS: 1-2 comorbidities: Breast and Left axillary surgery and recent abdominal surgery  are also affecting patient's functional outcome.   REHAB POTENTIAL: Excellent  CLINICAL DECISION MAKING: Stable/uncomplicated  EVALUATION COMPLEXITY: Low  GOALS: Goals reviewed with patient? Yes  SHORT TERM GOALS: Target date: 08/13/2023  Pt will be independent in a HEP for  ROM,NTS, stabilization to improve left shoulder ROM Baseline: Goal status:MET  2.  Pt will be independent in abdominal treatment to reduce swelling Baseline:  Goal status: MET 3.  Pt will have appropriate compression garments for abdominal swelling prn Baseline:  Goal status: MET 08/18/2022 4.  Pt will have decreased abdominal pain at night no greater than 3/10 Baseline:  Goal status: MET 08/25/2022  5.  Pt will have left shoulder Flex and abd improved by atleast 15 degrees Baseline: 134 flex, 137 abd Goal status: MET flexion,  13 Degrees better with abd(10/28/22) 6.  Pt will have decreased pain/median nerve symptoms in left UE to no greater than 2/10 Baseline:  Goal status: MET at rest, ongoing with movement LONG TERM GOALS: Target date: 09/03/2023/ 10/31/2022  Pt will have Left shoulder ROM within 7 degrees of right shoulder ROM Baseline: 160 flex, 175 abd Goal status: MET flex 09/05/22 2.  Pt will have abdominal pain no greater than 1/10 at night to allow better sleep Baseline: MET Goal status: 3.   Median nerve symptoms in left UE will no longer be present Baseline:  Goal status: improving,ONGOING 4.  Pt will be independent in self management of Left breast , and abdominal swelling, and shoulder stretching/strengthening Baseline:  Goal status:ONGOING    PLAN:  PT FREQUENCY: 1 x/week  PT DURATION: 6 weeks  PLANNED INTERVENTIONS: Therapeutic exercises, Therapeutic activity, Neuromuscular re-education, Patient/Family education, Self Care, Joint mobilization, Orthotic/Fit training, Dry Needling, Manual lymph drainage, scar mobilization, Vasopneumatic device, Manual therapy, and Re-evaluation  PLAN FOR NEXT SESSION: did she get new sleeve? Dry needling? Cont abdominal treatment (superficial and deep abdominals)review intact sequence,  Review left breast MLD, MFR to cording prn. Pt has a Flexi touch at home will resume using it, check compression sleeve: gave pt script to purchase new sleeve. Rejuvenate bra or custom cami?  PHYSICAL THERAPY DISCHARGE SUMMARY  Visits from Start of Care: 17  Current functional level  related to goals / functional outcomes: Achieved all STG's and 2/5 LTGs   Remaining deficits: Intermittent swelling/cording/median nerve symptoms remain   Education / Equipment: MLD, HEP   Patient agrees to discharge. Patient goals were partially met. Patient is being discharged due to feeling better and being independent in self management.   Waynette Buttery, PT 01/27/2023, 5:08 PM

## 2023-02-02 DIAGNOSIS — M2021 Hallux rigidus, right foot: Secondary | ICD-10-CM | POA: Diagnosis not present

## 2023-02-09 DIAGNOSIS — F4312 Post-traumatic stress disorder, chronic: Secondary | ICD-10-CM | POA: Diagnosis not present

## 2023-02-23 DIAGNOSIS — F4312 Post-traumatic stress disorder, chronic: Secondary | ICD-10-CM | POA: Diagnosis not present

## 2023-03-09 DIAGNOSIS — F4312 Post-traumatic stress disorder, chronic: Secondary | ICD-10-CM | POA: Diagnosis not present

## 2023-03-16 DIAGNOSIS — Z131 Encounter for screening for diabetes mellitus: Secondary | ICD-10-CM | POA: Diagnosis not present

## 2023-03-23 DIAGNOSIS — F4312 Post-traumatic stress disorder, chronic: Secondary | ICD-10-CM | POA: Diagnosis not present

## 2023-03-23 DIAGNOSIS — N920 Excessive and frequent menstruation with regular cycle: Secondary | ICD-10-CM | POA: Diagnosis not present

## 2023-03-23 DIAGNOSIS — R5383 Other fatigue: Secondary | ICD-10-CM | POA: Diagnosis not present

## 2023-03-23 DIAGNOSIS — N951 Menopausal and female climacteric states: Secondary | ICD-10-CM | POA: Diagnosis not present

## 2023-03-23 DIAGNOSIS — E611 Iron deficiency: Secondary | ICD-10-CM | POA: Diagnosis not present

## 2023-03-23 DIAGNOSIS — E7210 Disorders of sulfur-bearing amino-acid metabolism, unspecified: Secondary | ICD-10-CM | POA: Diagnosis not present

## 2023-03-23 DIAGNOSIS — E559 Vitamin D deficiency, unspecified: Secondary | ICD-10-CM | POA: Diagnosis not present

## 2023-04-06 DIAGNOSIS — F4312 Post-traumatic stress disorder, chronic: Secondary | ICD-10-CM | POA: Diagnosis not present

## 2023-04-06 LAB — COLOGUARD: COLOGUARD: NEGATIVE

## 2023-04-20 DIAGNOSIS — F4312 Post-traumatic stress disorder, chronic: Secondary | ICD-10-CM | POA: Diagnosis not present

## 2023-04-21 DIAGNOSIS — Z1231 Encounter for screening mammogram for malignant neoplasm of breast: Secondary | ICD-10-CM | POA: Diagnosis not present

## 2023-05-04 DIAGNOSIS — E7210 Disorders of sulfur-bearing amino-acid metabolism, unspecified: Secondary | ICD-10-CM | POA: Diagnosis not present

## 2023-05-04 DIAGNOSIS — N920 Excessive and frequent menstruation with regular cycle: Secondary | ICD-10-CM | POA: Diagnosis not present

## 2023-05-04 DIAGNOSIS — N951 Menopausal and female climacteric states: Secondary | ICD-10-CM | POA: Diagnosis not present

## 2023-05-04 DIAGNOSIS — N760 Acute vaginitis: Secondary | ICD-10-CM | POA: Diagnosis not present

## 2023-05-04 DIAGNOSIS — B9689 Other specified bacterial agents as the cause of diseases classified elsewhere: Secondary | ICD-10-CM | POA: Diagnosis not present

## 2023-05-04 DIAGNOSIS — E611 Iron deficiency: Secondary | ICD-10-CM | POA: Diagnosis not present

## 2023-07-21 NOTE — Therapy (Signed)
 OUTPATIENT PHYSICAL THERAPY  UPPER EXTREMITY ONCOLOGY EVALUATION  Patient Name: Sheena Jarvis MRN: [id number] DOB:1970-07-17, 53 y.o., female Today's Date: 07/22/2023  END OF SESSION:  PT End of Session - 07/22/23 0826     Visit Number 1    Number of Visits 12    Date for PT Re-Evaluation 09/02/23    PT Start Time 0826   late   PT Stop Time 0857    PT Time Calculation (min) 31 min    Activity Tolerance Patient tolerated treatment well    Behavior During Therapy [facility] for tasks assessed/performed             Past Medical History:  Diagnosis Date   Pneumonia    PONV (postoperative nausea and vomiting)    Vision abnormalities    Past Surgical History:  Procedure Laterality Date   APPENDECTOMY     BREAST BIOPSY Left 01/12/2019   Pash   BREAST BIOPSY Left 12/21/2018   benign   BREAST CYST ASPIRATION Left    BREAST EXCISIONAL BIOPSY Left 06/22/2019   Axilla BX - Benign   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   HIP ARTHROSCOPY     MASS EXCISION Left 02/10/2019   Procedure: EXCISION OF LEFT BREAST MASS;  Surgeon: Almond Lint, MD;  Location: [facility];  Service: General;  Laterality: Left;   SCAR REVISION Left 06/22/2019   Procedure: LEFT AXILLARY CONTRACTURE RELEASE;  Surgeon: Almond Lint, MD;  Location: [facility];  Service: General;  Laterality: Left;   Patient Active Problem List   Diagnosis Date Noted   Numbness 02/04/2016   Cognitive changes 02/04/2016   Other fatigue 02/04/2016      REFERRING PROVIDER: Remi Deter, MD  REFERRING DIAG: Lymphedema  THERAPY DIAG:  Lymphedema, not elsewhere classified  Aftercare following surgery for neoplasm  Localized edema  ONSET DATE: Oct 2023 with recent exacerbation, 1 month ago(Jun 23, 2023)  Rationale for Evaluation and Treatment: Rehabilitation  SUBJECTIVE:                                                                                                                                                                                            SUBJECTIVE STATEMENT:  I got back from [place] and realized I had not worn my sleeve or compression bra on the plane. I didn't have any problems on my way there, but I wore my compression. Several days after getting home I developed a lot of swelling in the left breast with pain at medial arm and sensation of cording in the axilla. Even my abdomen feels bad now and  its not changing. I have been doing my Flexitouch and it feels better, but in the am when I wake up its bothering me again. I am wearing the [place] bra and a foam pad on the medial side. I wore the sleeve alittle, but I am so achy and heavy it makes it worse. Iron is low and I am really tired.   PERTINENT HISTORY:  Pt had recent surgery for Stage II Endometriosis on Oct 31,2024. She had a prior history of removal of benign breast mass on 02/10/2019 with complication of seroma and clogged drain and possible infection. She also developed lymphedema in her left breast and axillary cording. She receieved extensive PT with MLD, compression, manual therapy and dry needling, but had a significant persistent cord that was painful and limited shoulder ROM. She underwent an excision of that contracted scar tissue on 06/22/2019 that included one node, and develped a seroma after that.   PAIN:  Are you having pain? Yes NPRS scale: 2-3/10 at best, worst 9/10 at night Pain location: left breast Pain orientation: Left and Upper  PAIN TYPE: aching and heavy, burning Pain description: constant, burning, and aching  Aggravating factors: night time, a lot of movement Relieving factors: compression,  PRECAUTIONS: Left UE lymphedema  RED FLAGS: None   WEIGHT BEARING RESTRICTIONS: No  FALLS:  Has patient fallen in last 6 months? 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  COGNITION: Overall cognitive status: Within functional limits for tasks assessed   PALPATION: Tenderness in axillary area of cording with tightness noted to run along the biceps  OBSERVATIONS / OTHER ASSESSMENTS: significant generalized left breast swelling and lateral trunk swelling, minimally enlarged pores, some medial fibrosis  SENSATION: Light touch:     POSTURE: WNL  UPPER EXTREMITY AROM/PROM:  A/PROM RIGHT   eval   Shoulder extension 63  Shoulder flexion 155  Shoulder abduction 177  Shoulder internal rotation   Shoulder external rotation     (Blank rows = not tested)  A/PROM LEFT   eval  Shoulder extension 50  Shoulder flexion 150, cord, impingement  Shoulder abduction 149  Shoulder internal rotation   Shoulder external rotation     (Blank rows = not tested)  CERVICAL AROM: All within normal limits:   UPPER EXTREMITY STRENGTH:   LYMPHEDEMA ASSESSMENTS:   SURGERY TYPE/DATE: 02/10/2019 Removal of Benign Breast Mass, 06/22/2019 Excision of contracted scar , Endometriosis/hysterectomy surgery 03/04/2022   NUMBER OF LYMPH NODES REMOVED: 1 left axillary with excision of contracted scar   CHEMOTHERAPY: NO  RADIATION:NO  HORMONE TREATMENT: NO  INFECTIONS: LKG:[id number] seromas after surgery   LYMPHEDEMA ASSESSMENTS:   LANDMARK RIGHT  eval  At axilla  24.9  15 cm proximal to olecranon process   10 cm proximal to olecranon process 23.6  Olecranon process 21.6  15 cm proximal to ulnar styloid process   10 cm proximal to ulnar styloid process 19.2  Just proximal to ulnar styloid process 14.45  Across hand at thumb web space 18  At base of 2nd digit 5.7  (Blank  rows = not tested)  LANDMARK LEFT  eval  At axilla  25  15 cm proximal to olecranon process   10 cm proximal to olecranon process 22.6  Olecranon process 21.3  15 cm proximal to ulnar styloid process   10 cm proximal to ulnar styloid process 18.3  Just proximal to ulnar styloid process 14.4  Across hand at thumb web space 17.8  At base of 2nd digit 5.85  (Blank rows = not tested)   FUNCTIONAL TESTS:    GAIT: WNL      BREAST COMPLAINTS QUESTIONNAIRE Pain:8 Heaviness:10 Swollen feeling:10 Tense Skin:8 Redness:0 Bra Print:10 Size of Pores:1 Hard feeling: 5 Total:   52  /80 A Score over 9 indicates lymphedema issues in the breast                                                                                       TREATMENT DATE:      PATIENT EDUCATION:  Education details: compression bra and need for new ones, may wear at night to see if am pain is better, gave script Person educated: Patient Education method: Explanation Education comprehension: verbalized understanding  HOME EXERCISE PROGRAM:   ASSESSMENT:  CLINICAL IMPRESSION: Patient is a 53 y.o. female who was seen today for physical therapy evaluation and treatment for complaints of increased breast, arm and abdominal swelling after a flight from [place] where she forgot to wear her compression garments. Several days after returning she had significant increase in generalized  left breast swelling with heaviness and pain. She also notes aching in her left UE, and discomfort in her abdomen. She feels pretty good after using her flexi touch at night, but in the am she is in much more pain. We discussed wearing her compression bra at night to see if that helps, and to consider purchasing new compression bras as hers are several years old. There is no significant difference in UE circumference measurements however her arm does get very achy. She is noted to have tight cording in left axilla which is limiting her  ROM.She will benefit from skilled PT to address deficits and return to PLOF.   OBJECTIVE IMPAIRMENTS: decreased knowledge of condition, increased edema, and impaired UE functional use.   ACTIVITY LIMITATIONS: sleeping, reach over head, and caring for others  PARTICIPATION LIMITATIONS:  exercising  PERSONAL FACTORS:  1-2 comorbidities: Breast and Left axillary surgery and recent abdominal surgery  are also affecting patient's functional outcome.  are also affecting patient's functional outcome.   REHAB POTENTIAL: Excellent  CLINICAL DECISION MAKING: Stable/uncomplicated  EVALUATION COMPLEXITY: Low  GOALS: Goals reviewed with patient? Yes  SHORT TERM GOALS: Target date: 08/12/2023  Pt will report decreased breast pain and arm pain by 25% Baseline: Goal status: INITIAL  2.  Breast complaints questionnaire will improve to 40 to demonstrate improvement Baseline:  Goal status: INITIAL  3.  Pt will be independent with left breast MLD to decrease swelling Baseline:  Goal status: INITIAL   LONG TERM GOALS: Target date: 09/02/2023  Pt will have decreased left breast and UE pain by greater than 50% Baseline:  Goal status: INITIAL  2.  Pt will have breast complaints questionnaire no greater than 25 to demonstrate improved swelling Baseline:  Goal status: INITIAL  3.  Pt will have appropriate compression garments for her arm and breast Baseline:  Goal status: INITIAL  4.  Pt will have left shoulder abd 165 for improved reaching Baseline:  Goal status: INITIAL  5.  Pt will have improved left breast swelling by 50% or greater Baseline:  Goal status: INITIAL   PLAN:  PT FREQUENCY: 2x/week  PT DURATION: 6 weeks  PLANNED INTERVENTIONS: 97164- PT Re-evaluation, 97110-Therapeutic exercises, 97530- Therapeutic activity, 97112- Neuromuscular re-education, 97535- Self Care, 16109- Manual therapy, and 97760- Orthotic Fit/training  PLAN FOR NEXT SESSION: MFR left axillary  cording, initiate MLD to left breast and arm and start reviewing with pt. Was she able to get new bras?taping?  Waynette Buttery, PT 07/22/2023, 11:00 AM

## 2023-07-22 ENCOUNTER — Ambulatory Visit: Attending: Obstetrics and Gynecology

## 2023-07-22 ENCOUNTER — Other Ambulatory Visit: Payer: Self-pay

## 2023-07-22 DIAGNOSIS — Z483 Aftercare following surgery for neoplasm: Secondary | ICD-10-CM

## 2023-07-22 DIAGNOSIS — M25612 Stiffness of left shoulder, not elsewhere classified: Secondary | ICD-10-CM

## 2023-07-22 DIAGNOSIS — I890 Lymphedema, not elsewhere classified: Secondary | ICD-10-CM | POA: Diagnosis present

## 2023-07-22 DIAGNOSIS — R600 Localized edema: Secondary | ICD-10-CM

## 2023-07-24 ENCOUNTER — Ambulatory Visit: Admitting: Rehabilitation

## 2023-07-24 DIAGNOSIS — I890 Lymphedema, not elsewhere classified: Secondary | ICD-10-CM

## 2023-07-24 DIAGNOSIS — R600 Localized edema: Secondary | ICD-10-CM

## 2023-07-24 DIAGNOSIS — Z483 Aftercare following surgery for neoplasm: Secondary | ICD-10-CM

## 2023-07-24 DIAGNOSIS — M25612 Stiffness of left shoulder, not elsewhere classified: Secondary | ICD-10-CM

## 2023-07-24 NOTE — Therapy (Signed)
 OUTPATIENT PHYSICAL THERAPY  UPPER EXTREMITY ONCOLOGY EVALUATION  Patient Name: Sheena Jarvis MRN: [id number] DOB:Sep 06, 1970, 53 y.o., female Today's Date: 07/24/2023  END OF SESSION:  PT End of Session - 07/24/23 1110     Visit Number 2    Number of Visits 12    Date for PT Re-Evaluation 09/02/23    PT Start Time 0800    PT Stop Time 0857    PT Time Calculation (min) 57 min    Activity Tolerance Patient tolerated treatment well    Behavior During Therapy [facility] for tasks assessed/performed              Past Medical History:  Diagnosis Date   Pneumonia    PONV (postoperative nausea and vomiting)    Vision abnormalities    Past Surgical History:  Procedure Laterality Date   APPENDECTOMY     BREAST BIOPSY Left 01/12/2019   Pash   BREAST BIOPSY Left 12/21/2018   benign   BREAST CYST ASPIRATION Left    BREAST EXCISIONAL BIOPSY Left 06/22/2019   Axilla BX - Benign   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   HIP ARTHROSCOPY     MASS EXCISION Left 02/10/2019   Procedure: EXCISION OF LEFT BREAST MASS;  Surgeon: Almond Lint, MD;  Location: [facility];  Service: General;  Laterality: Left;   SCAR REVISION Left 06/22/2019   Procedure: LEFT AXILLARY CONTRACTURE RELEASE;  Surgeon: Almond Lint, MD;  Location: [facility];  Service: General;  Laterality: Left;   Patient Active Problem List   Diagnosis Date Noted   Numbness 02/04/2016   Cognitive changes 02/04/2016   Other fatigue 02/04/2016      REFERRING PROVIDER: Remi Deter, MD  REFERRING DIAG: Lymphedema  THERAPY DIAG:  Lymphedema, not elsewhere classified  Aftercare following surgery for neoplasm  Stiffness of left shoulder, not elsewhere classified  Localized edema  ONSET DATE: Oct 2023 with recent exacerbation, 1 month ago(Jun 23, 2023)  Rationale for Evaluation and Treatment: Rehabilitation  SUBJECTIVE:                                                                                                                                                                                            SUBJECTIVE STATEMENT:  It is a bit better since I have added some foam into my bra on the side.    EVAL: I got back from [place] and realized I had not worn my sleeve or compression bra on the plane. I didn't have any problems on my way there, but I wore my compression. Several days after getting home I  developed a lot of swelling in the left breast with pain at medial arm and sensation of cording in the axilla. Even my abdomen feels bad now and its not changing. I have been doing my Flexitouch and it feels better, but in the am when I wake up its bothering me again. I am wearing the [place] bra and a foam pad on the medial side. I wore the sleeve alittle, but I am so achy and heavy it makes it worse. Iron is low and I am really tired.   PERTINENT HISTORY:  Pt had recent surgery for Stage II Endometriosis on Oct 31,2024. She had a prior history of removal of benign breast mass on 02/10/2019 with complication of seroma and clogged drain and possible infection. She also developed lymphedema in her left breast and axillary cording. She receieved extensive PT with MLD, compression, manual therapy and dry needling, but had a significant persistent cord that was painful and limited shoulder ROM. She underwent an excision of that contracted scar tissue on 06/22/2019 that included one node, and develped a seroma after that.   PAIN:  Are you having pain? Yes NPRS scale: 2-3/10 at best, worst 9/10 at night Pain location: left breast Pain orientation: Left and Upper  PAIN TYPE: aching and heavy, burning Pain description: constant, burning, and aching  Aggravating factors: night time, a lot of movement Relieving factors: compression,  PRECAUTIONS: Left UE lymphedema  RED FLAGS: None   WEIGHT BEARING RESTRICTIONS: No  FALLS:  Has patient fallen in last 6 months? 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  COGNITION: Overall cognitive status: Within functional limits for tasks assessed   PALPATION: Tenderness in axillary area of cording with tightness noted to run along the biceps  OBSERVATIONS / OTHER ASSESSMENTS: significant generalized left breast swelling and lateral trunk swelling, minimally enlarged pores, some medial fibrosis  SENSATION: Light touch:     POSTURE: WNL  UPPER EXTREMITY AROM/PROM:  A/PROM RIGHT   eval   Shoulder extension 63  Shoulder flexion 155  Shoulder abduction 177  Shoulder internal rotation   Shoulder external rotation     (Blank rows = not tested)  A/PROM LEFT   eval  Shoulder extension 50  Shoulder flexion 150, cord, impingement  Shoulder abduction 149  Shoulder internal rotation   Shoulder external rotation     (Blank rows = not tested)  CERVICAL AROM: All within normal limits:   UPPER EXTREMITY STRENGTH:   LYMPHEDEMA ASSESSMENTS:   SURGERY TYPE/DATE: 02/10/2019 Removal of Benign Breast Mass, 06/22/2019 Excision of contracted scar , Endometriosis/hysterectomy surgery 03/04/2022   NUMBER OF LYMPH NODES REMOVED: 1 left axillary with excision of contracted scar   CHEMOTHERAPY: NO  RADIATION:NO  HORMONE TREATMENT: NO  INFECTIONS: EAV:[id number] seromas after surgery   LYMPHEDEMA ASSESSMENTS:   LANDMARK RIGHT  eval  At axilla  24.9  15 cm proximal to olecranon process   10 cm proximal to olecranon process 23.6  Olecranon process 21.6  15 cm proximal to ulnar styloid process   10 cm proximal to ulnar styloid  process 19.2  Just proximal to ulnar styloid process 14.45  Across hand at  thumb web space 18  At base of 2nd digit 5.7  (Blank rows = not tested)  LANDMARK LEFT  eval  At axilla  25  15 cm proximal to olecranon process   10 cm proximal to olecranon process 22.6  Olecranon process 21.3  15 cm proximal to ulnar styloid process   10 cm proximal to ulnar styloid process 18.3  Just proximal to ulnar styloid process 14.4  Across hand at thumb web space 17.8  At base of 2nd digit 5.85  (Blank rows = not tested)   FUNCTIONAL TESTS:    GAIT: WNL      BREAST COMPLAINTS QUESTIONNAIRE Pain:8 Heaviness:10 Swollen feeling:10 Tense Skin:8 Redness:0 Bra Print:10 Size of Pores:1 Hard feeling: 5 Total:   52  /80 A Score over 9 indicates lymphedema issues in the breast                                                                                       TREATMENT DATE:  07/24/23 Supine cording release/MFR in arm propped overhead on pillow position using opposing pressure, S pressure, and incorporating wave tool along cord with pt holding breast medially for opposing pull.  PROM into flexion and D2 Then In supine: Short neck, superficial and deep abdominals, bil axillary nodes and establishment of interaxillary pathway, L inguinal nodes and establishment of axilloinguinal pathway, then L breast moving fluid towards pathways spending extra time in any areas of fibrosis then retracing all steps.   PATIENT EDUCATION:  Education details: compression bra and need for new ones, may wear at night to see if am pain is better, gave script Person educated: Patient Education method: Explanation Education comprehension: verbalized understanding  HOME EXERCISE PROGRAM:   ASSESSMENT:  CLINICAL IMPRESSION: Larger cording in the mid axilla and one smaller cord along the bicep region.  Softening post MT.  Continued Lt breast MLD due to general edema.    OBJECTIVE IMPAIRMENTS: decreased  knowledge of condition, increased edema, and impaired UE functional use.   ACTIVITY LIMITATIONS: sleeping, reach over head, and caring for others  PARTICIPATION LIMITATIONS:  exercising  PERSONAL FACTORS:  1-2 comorbidities: Breast and Left axillary surgery and recent abdominal surgery  are also affecting patient's functional outcome.  are also affecting patient's functional outcome.   REHAB POTENTIAL: Excellent  CLINICAL DECISION MAKING: Stable/uncomplicated  EVALUATION COMPLEXITY: Low  GOALS: Goals reviewed with patient? Yes  SHORT TERM GOALS: Target date: 08/12/2023  Pt will report decreased breast pain and arm pain by 25% Baseline: Goal status: INITIAL  2.  Breast complaints questionnaire will improve to 40 to demonstrate improvement Baseline:  Goal status: INITIAL  3.  Pt will be independent with left breast MLD to decrease swelling Baseline:  Goal status: INITIAL   LONG TERM GOALS: Target date: 09/02/2023  Pt will have decreased left breast and UE pain by greater than 50% Baseline:  Goal status: INITIAL  2.  Pt will have breast complaints questionnaire no greater than 25 to demonstrate improved swelling Baseline:  Goal status: INITIAL  3.  Pt will have appropriate compression garments for her arm and breast Baseline:  Goal status: INITIAL  4.  Pt will have left shoulder abd 165 for improved reaching Baseline:  Goal status: INITIAL  5.  Pt will have improved left breast swelling by 50% or greater Baseline:  Goal status: INITIAL   PLAN:  PT FREQUENCY: 2x/week  PT DURATION: 6 weeks  PLANNED INTERVENTIONS: 97164- PT Re-evaluation, 97110-Therapeutic exercises, 97530- Therapeutic activity, 97112- Neuromuscular re-education, 97535- Self Care, 78295- Manual therapy, and 97760- Orthotic Fit/training  PLAN FOR NEXT SESSION: MFR left axillary cording, initiate MLD to left breast and arm and start reviewing with pt. Was she able to get new bras?taping?  [name], PT 07/24/2023, 11:10 AM

## 2023-07-27 ENCOUNTER — Encounter: Payer: Self-pay | Admitting: Rehabilitation

## 2023-07-27 ENCOUNTER — Ambulatory Visit: Admitting: Rehabilitation

## 2023-07-27 DIAGNOSIS — Z483 Aftercare following surgery for neoplasm: Secondary | ICD-10-CM

## 2023-07-27 DIAGNOSIS — M25612 Stiffness of left shoulder, not elsewhere classified: Secondary | ICD-10-CM

## 2023-07-27 DIAGNOSIS — I890 Lymphedema, not elsewhere classified: Secondary | ICD-10-CM

## 2023-07-27 DIAGNOSIS — R600 Localized edema: Secondary | ICD-10-CM

## 2023-07-27 NOTE — Therapy (Signed)
 OUTPATIENT PHYSICAL THERAPY  UPPER EXTREMITY ONCOLOGY EVALUATION  Patient Name: Sheena Jarvis MRN: [id number] DOB:[birth year], 53 y.o., female Today's Date: 07/27/2023  END OF SESSION:  PT End of Session - 07/27/23 0858     Visit Number 3    Number of Visits 12    Date for PT Re-Evaluation 09/02/23    PT Start Time 0802    PT Stop Time 0857    PT Time Calculation (min) 55 min    Activity Tolerance Patient tolerated treatment well    Behavior During Therapy [facility] for tasks assessed/performed              Past Medical History:  Diagnosis Date   Pneumonia    PONV (postoperative nausea and vomiting)    Vision abnormalities    Past Surgical History:  Procedure Laterality Date   APPENDECTOMY     BREAST BIOPSY Left 01/12/2019   Pash   BREAST BIOPSY Left 12/21/2018   benign   BREAST CYST ASPIRATION Left    BREAST EXCISIONAL BIOPSY Left 06/22/2019   Axilla BX - Benign   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   HIP ARTHROSCOPY     MASS EXCISION Left 02/10/2019   Procedure: EXCISION OF LEFT BREAST MASS;  Surgeon: Almond Lint, MD;  Location: [facility];  Service: General;  Laterality: Left;   SCAR REVISION Left 06/22/2019   Procedure: LEFT AXILLARY CONTRACTURE RELEASE;  Surgeon: Almond Lint, MD;  Location: [facility];  Service: General;  Laterality: Left;   Patient Active Problem List   Diagnosis Date Noted   Numbness 02/04/2016   Cognitive changes 02/04/2016   Other fatigue 02/04/2016      REFERRING PROVIDER: Remi Deter, MD  REFERRING DIAG: Lymphedema  THERAPY DIAG:  Lymphedema, not elsewhere classified  Aftercare following surgery for neoplasm  Stiffness of left shoulder, not elsewhere classified  Localized edema  ONSET DATE: Oct 2023 with recent exacerbation, 1 month ago(Jun 23, 2023)  Rationale for Evaluation and Treatment: Rehabilitation  SUBJECTIVE:                                                                                                                                                                                            SUBJECTIVE STATEMENT:  The nipple feels on fire    EVAL: I got back from [place] and realized I had not worn my sleeve or compression bra on the plane. I didn't have any problems on my way there, but I wore my compression. Several days after getting home I developed a lot of swelling in the left breast with pain at  medial arm and sensation of cording in the axilla. Even my abdomen feels bad now and its not changing. I have been doing my Flexitouch and it feels better, but in the am when I wake up its bothering me again. I am wearing the [place] bra and a foam pad on the medial side. I wore the sleeve alittle, but I am so achy and heavy it makes it worse. Iron is low and I am really tired.   PERTINENT HISTORY:  Pt had recent surgery for Stage II Endometriosis on Oct 31,2024. She had a prior history of removal of benign breast mass on 02/10/2019 with complication of seroma and clogged drain and possible infection. She also developed lymphedema in her left breast and axillary cording. She receieved extensive PT with MLD, compression, manual therapy and dry needling, but had a significant persistent cord that was painful and limited shoulder ROM. She underwent an excision of that contracted scar tissue on 06/22/2019 that included one node, and develped a seroma after that.   PAIN:  Are you having pain? Yes NPRS scale: 2-3/10 at best, worst 9/10 at night Pain location: left breast Pain orientation: Left and Upper  PAIN TYPE: aching and heavy, burning Pain description: constant, burning, and aching  Aggravating factors: night time, a lot of movement Relieving factors: compression,  PRECAUTIONS: Left UE lymphedema  RED FLAGS: None   WEIGHT BEARING RESTRICTIONS: No  FALLS:  Has patient fallen in last 6 months? 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  COGNITION: Overall cognitive status: Within functional limits for tasks assessed   PALPATION: Tenderness in axillary area of cording with tightness noted to run along the biceps  OBSERVATIONS / OTHER ASSESSMENTS: significant generalized left breast swelling and lateral trunk swelling, minimally enlarged pores, some medial fibrosis  SENSATION: Light touch:     POSTURE: WNL  UPPER EXTREMITY AROM/PROM:  A/PROM RIGHT   eval   Shoulder extension 63  Shoulder flexion 155  Shoulder abduction 177  Shoulder internal rotation   Shoulder external rotation     (Blank rows = not tested)  A/PROM LEFT   eval  Shoulder extension 50  Shoulder flexion 150, cord, impingement  Shoulder abduction 149  Shoulder internal rotation   Shoulder external rotation     (Blank rows = not tested)  CERVICAL AROM: All within normal limits:   UPPER EXTREMITY STRENGTH:   LYMPHEDEMA ASSESSMENTS:   SURGERY TYPE/DATE: 02/10/2019 Removal of Benign Breast Mass, 06/22/2019 Excision of contracted scar , Endometriosis/hysterectomy surgery 03/04/2022   NUMBER OF LYMPH NODES REMOVED: 1 left axillary with excision of contracted scar   CHEMOTHERAPY: NO  RADIATION:NO  HORMONE TREATMENT: NO  INFECTIONS: [id number] seromas after surgery   LYMPHEDEMA ASSESSMENTS:   LANDMARK RIGHT  eval  At axilla  24.9  15 cm proximal to olecranon process   10 cm proximal to olecranon process 23.6  Olecranon process 21.6  15 cm proximal to ulnar styloid process   10 cm proximal to ulnar styloid process 19.2  Just proximal to ulnar  styloid process 14.45  Across hand at thumb web space 18  At base of 2nd digit 5.7  (  Blank rows = not tested)  LANDMARK LEFT  eval  At axilla  25  15 cm proximal to olecranon process   10 cm proximal to olecranon process 22.6  Olecranon process 21.3  15 cm proximal to ulnar styloid process   10 cm proximal to ulnar styloid process 18.3  Just proximal to ulnar styloid process 14.4  Across hand at thumb web space 17.8  At base of 2nd digit 5.85  (Blank rows = not tested)   FUNCTIONAL TESTS:    GAIT: WNL      BREAST COMPLAINTS QUESTIONNAIRE Pain:8 Heaviness:10 Swollen feeling:10 Tense Skin:8 Redness:0 Bra Print:10 Size of Pores:1 Hard feeling: 5 Total:   52  /80 A Score over 9 indicates lymphedema issues in the breast                                                                                       TREATMENT DATE:  07/27/23 Supine cording release/MFR in arm propped overhead on pillow position using opposing pressure, S pressure, and incorporating wave tool along cord with pt holding breast medially for opposing pull.  PROM into flexion and D2 MFR from axilla into lateral breast in a direction towards the belly button with pt reporting this reproduces some of her shoulder pain.  Then In supine: Short neck, superficial and deep abdominals, bil axillary nodes and establishment of interaxillary pathway, L inguinal nodes and establishment of axilloinguinal pathway, then L breast moving fluid towards pathways spending extra time in any areas of fibrosis then retracing all steps.  07/24/23 Supine cording release/MFR in arm propped overhead on pillow position using opposing pressure, S pressure, and incorporating wave tool along cord with pt holding breast medially for opposing pull.  PROM into flexion and D2 Then In supine: Short neck, superficial and deep abdominals, bil axillary nodes and establishment of interaxillary pathway, L inguinal nodes and establishment of  axilloinguinal pathway, then L breast moving fluid towards pathways spending extra time in any areas of fibrosis then retracing all steps.   PATIENT EDUCATION:  Education details: compression bra and need for new ones, may wear at night to see if am pain is better, gave script Person educated: Patient Education method: Explanation Education comprehension: verbalized understanding  HOME EXERCISE PROGRAM:   ASSESSMENT:  CLINICAL IMPRESSION: Continued POC. Some shoulder pain was reproduced with inferior-medial MFR pull from shoulder to breast. Discussed vibration to the area with vibrator, looking into LLLT.  Softening post MT.  Continued Lt breast MLD due to general edema.    OBJECTIVE IMPAIRMENTS: decreased knowledge of condition, increased edema, and impaired UE functional use.   ACTIVITY LIMITATIONS: sleeping, reach over head, and caring for others  PARTICIPATION LIMITATIONS:  exercising  PERSONAL FACTORS:  1-2 comorbidities: Breast and Left axillary surgery and recent abdominal surgery  are also affecting patient's functional outcome.  are also affecting patient's functional outcome.   REHAB POTENTIAL: Excellent  CLINICAL DECISION MAKING: Stable/uncomplicated  EVALUATION COMPLEXITY: Low  GOALS: Goals reviewed with patient? Yes  SHORT TERM GOALS: Target date: 08/12/2023  Pt will report decreased breast pain and arm pain by 25% Baseline: Goal status: INITIAL  2.  Breast  complaints questionnaire will improve to 40 to demonstrate improvement Baseline:  Goal status: INITIAL  3.  Pt will be independent with left breast MLD to decrease swelling Baseline:  Goal status: INITIAL   LONG TERM GOALS: Target date: 09/02/2023  Pt will have decreased left breast and UE pain by greater than 50% Baseline:  Goal status: INITIAL  2.  Pt will have breast complaints questionnaire no greater than 25 to demonstrate improved swelling Baseline:  Goal status: INITIAL  3.  Pt will have  appropriate compression garments for her arm and breast Baseline:  Goal status: INITIAL  4.  Pt will have left shoulder abd 165 for improved reaching Baseline:  Goal status: INITIAL  5.  Pt will have improved left breast swelling by 50% or greater Baseline:  Goal status: INITIAL   PLAN:  PT FREQUENCY: 2x/week  PT DURATION: 6 weeks  PLANNED INTERVENTIONS: 97164- PT Re-evaluation, 97110-Therapeutic exercises, 97530- Therapeutic activity, 97112- Neuromuscular re-education, 97535- Self Care, 16109- Manual therapy, and 97760- Orthotic Fit/training  PLAN FOR NEXT SESSION: MFR left axillary cording, initiate MLD to left breast and arm and start reviewing with pt. Was she able to get new bras?taping?  [name], PT 07/27/2023, 8:58 AM

## 2023-07-29 ENCOUNTER — Ambulatory Visit

## 2023-07-29 DIAGNOSIS — M25612 Stiffness of left shoulder, not elsewhere classified: Secondary | ICD-10-CM

## 2023-07-29 DIAGNOSIS — I890 Lymphedema, not elsewhere classified: Secondary | ICD-10-CM

## 2023-07-29 DIAGNOSIS — R600 Localized edema: Secondary | ICD-10-CM

## 2023-07-29 DIAGNOSIS — Z483 Aftercare following surgery for neoplasm: Secondary | ICD-10-CM

## 2023-07-29 NOTE — Therapy (Signed)
 OUTPATIENT PHYSICAL THERAPY  UPPER EXTREMITY ONCOLOGY EVALUATION  Patient Name: Sheena Jarvis MRN: [id number] DOB:08-May-1970, 53 y.o., female Today's Date: 07/29/2023  END OF SESSION:  PT End of Session - 07/29/23 0756     Visit Number 4    Number of Visits 12    Date for PT Re-Evaluation 09/02/23    PT Start Time 0800    PT Stop Time 0854    PT Time Calculation (min) 54 min    Activity Tolerance Patient tolerated treatment well    Behavior During Therapy [facility] for tasks assessed/performed              Past Medical History:  Diagnosis Date   Pneumonia    PONV (postoperative nausea and vomiting)    Vision abnormalities    Past Surgical History:  Procedure Laterality Date   APPENDECTOMY     BREAST BIOPSY Left 01/12/2019   Pash   BREAST BIOPSY Left 12/21/2018   benign   BREAST CYST ASPIRATION Left    BREAST EXCISIONAL BIOPSY Left 06/22/2019   Axilla BX - Benign   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   HIP ARTHROSCOPY     MASS EXCISION Left 02/10/2019   Procedure: EXCISION OF LEFT BREAST MASS;  Surgeon: Almond Lint, MD;  Location: [facility];  Service: General;  Laterality: Left;   SCAR REVISION Left 06/22/2019   Procedure: LEFT AXILLARY CONTRACTURE RELEASE;  Surgeon: Almond Lint, MD;  Location: [facility];  Service: General;  Laterality: Left;   Patient Active Problem List   Diagnosis Date Noted   Numbness 02/04/2016   Cognitive changes 02/04/2016   Other fatigue 02/04/2016      REFERRING PROVIDER: Remi Deter, MD  REFERRING DIAG: Lymphedema  THERAPY DIAG:  Lymphedema, not elsewhere classified  Aftercare following surgery for neoplasm  Stiffness of left shoulder, not elsewhere classified  Localized edema  ONSET DATE: Oct 2023 with recent exacerbation, 1 month ago(Jun 23, 2023)  Rationale for Evaluation and Treatment: Rehabilitation  SUBJECTIVE:                                                                                                                                                                                            SUBJECTIVE STATEMENT:   It is steadily improving. I am waking up less. I am wearing compression consistently. I go to [employer] today to be fit for a new bra. The cording is a little better, but still there.  My arm just aches regardless if I wear compression.   EVAL: I got back from [place] and realized I had  not worn my sleeve or compression bra on the plane. I didn't have any problems on my way there, but I wore my compression. Several days after getting home I developed a lot of swelling in the left breast with pain at medial arm and sensation of cording in the axilla. Even my abdomen feels bad now and its not changing. I have been doing my Flexitouch and it feels better, but in the am when I wake up its bothering me again. I am wearing the [employer] bra and a foam pad on the medial side. I wore the sleeve alittle, but I am so achy and heavy it makes it worse. Iron is low and I am really tired.   PERTINENT HISTORY:  Pt had recent surgery for Stage II Endometriosis on Oct 31,2024. She had a prior history of removal of benign breast mass on 02/10/2019 with complication of seroma and clogged drain and possible infection. She also developed lymphedema in her left breast and axillary cording. She receieved extensive PT with MLD, compression, manual therapy and dry needling, but had a significant persistent cord that was painful and limited shoulder ROM. She underwent an excision of that contracted scar tissue on 06/22/2019 that included one node, and develped a seroma after that.   PAIN:  Are you having pain? Yes NPRS scale: 2-3/10 at best, worst 9/10 at night Pain location: left breast Pain orientation: Left and Upper  PAIN TYPE: aching and heavy, burning Pain description: constant, burning, and aching  Aggravating factors: night time, a lot of movement Relieving  factors: compression,  PRECAUTIONS: Left UE lymphedema  RED FLAGS: None   WEIGHT BEARING RESTRICTIONS: No  FALLS:  Has patient fallen in last 6 months? 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  COGNITION: Overall cognitive status: Within functional limits for tasks assessed   PALPATION: Tenderness in axillary area of cording with tightness noted to run along the biceps  OBSERVATIONS / OTHER ASSESSMENTS: significant generalized left breast swelling and lateral trunk swelling, minimally enlarged pores, some medial fibrosis  SENSATION: Light touch:     POSTURE: WNL  UPPER EXTREMITY AROM/PROM:  A/PROM RIGHT   eval   Shoulder extension 63  Shoulder flexion 155  Shoulder abduction 177  Shoulder internal rotation   Shoulder external rotation     (Blank rows = not tested)  A/PROM LEFT   eval  Shoulder extension 50  Shoulder flexion 150, cord, impingement  Shoulder abduction 149  Shoulder internal rotation   Shoulder external rotation     (Blank rows = not tested)  CERVICAL AROM: All within normal limits:   UPPER EXTREMITY STRENGTH:   LYMPHEDEMA ASSESSMENTS:   SURGERY TYPE/DATE: 02/10/2019 Removal of Benign Breast Mass, 06/22/2019 Excision of contracted scar , Endometriosis/hysterectomy surgery 03/04/2022   NUMBER OF LYMPH NODES REMOVED: 1 left axillary with excision of contracted scar   CHEMOTHERAPY: NO  RADIATION:NO  HORMONE TREATMENT: NO  INFECTIONS: [id number] seromas after surgery   LYMPHEDEMA ASSESSMENTS:   LANDMARK RIGHT  eval  At axilla  24.9  15 cm  proximal to olecranon process   10 cm proximal to olecranon process 23.6  Olecranon process 21.6  15 cm proximal to ulnar styloid process   10 cm proximal to ulnar styloid process 19.2  Just proximal to ulnar styloid process 14.45  Across hand at thumb web space 18  At base of 2nd digit 5.7  (Blank rows = not tested)  LANDMARK LEFT  eval  At axilla  25  15 cm proximal to olecranon process   10 cm proximal to olecranon process 22.6  Olecranon process 21.3  15 cm proximal to ulnar styloid process   10 cm proximal to ulnar styloid process 18.3  Just proximal to ulnar styloid process 14.4  Across hand at thumb web space 17.8  At base of 2nd digit 5.85  (Blank rows = not tested)   FUNCTIONAL TESTS:    GAIT: WNL      BREAST COMPLAINTS QUESTIONNAIRE Pain:8 Heaviness:10 Swollen feeling:10 Tense Skin:8 Redness:0 Bra Print:10 Size of Pores:1 Hard feeling: 5 Total:   52  /80 A Score over 9 indicates lymphedema issues in the breast                                                                                       TREATMENT DATE:  07/29/2023 Supine cording release/MFR in arm propped overhead on pillow position using opposing pressure, S pressure, and incorporating wave tool along cord with pt holding breast medially for opposing pull.  PROM into flexion and D2 MFR from axilla into lateral breast in a direction towards the belly button with pt reporting this reproduces some of her shoulder pain.  Then In supine: Short neck, superficial and deep abdominals, bil axillary nodes and establishment of interaxillary pathway, L inguinal nodes and establishment of axilloinguinal pathway, then L breast moving fluid towards pathways spending extra time in any areas of fibrosis then retracing all steps and adding left UE proximal, then medial, and lateral directing toward pathways and both sides of forearm retracing all steps and ending with LN's    07/27/23 Supine cording release/MFR in  arm propped overhead on pillow position using opposing pressure, S pressure, and incorporating wave tool along cord with pt holding breast medially for opposing pull.  PROM into flexion and D2 MFR from axilla into lateral breast in a direction towards the belly button with pt reporting this reproduces some of her shoulder pain.  Then In supine: Short neck, superficial and deep abdominals, bil axillary nodes and establishment of interaxillary pathway, L inguinal nodes and establishment of axilloinguinal pathway, then L breast moving fluid towards pathways spending extra time in any areas of fibrosis then retracing all steps.  07/24/23 Supine cording release/MFR in arm propped overhead on pillow position using opposing pressure, S pressure, and incorporating wave tool along cord with pt holding breast medially for opposing pull.  PROM into flexion and D2 Then In supine: Short neck, superficial and deep abdominals, bil axillary nodes and establishment of interaxillary pathway, L inguinal nodes and establishment of axilloinguinal pathway, then L breast moving fluid towards pathways spending extra time in any areas of fibrosis then retracing all steps.   PATIENT EDUCATION:  Education details: compression bra and need for new ones, may wear at night to see if am pain  is better, gave script Person educated: Patient Education method: Explanation Education comprehension: verbalized understanding  HOME EXERCISE PROGRAM:   ASSESSMENT:  CLINICAL IMPRESSION:  Continued manual therapy for lymphatic drainage and added UE due to left UE achiness. Continued release techniques to left axillary/biceps cording. Pt travelling now through the weekend. Still swollen and uncomfortable.  OBJECTIVE IMPAIRMENTS: decreased knowledge of condition, increased edema, and impaired UE functional use.   ACTIVITY LIMITATIONS: sleeping, reach over head, and caring for others  PARTICIPATION LIMITATIONS:   exercising  PERSONAL FACTORS:  1-2 comorbidities: Breast and Left axillary surgery and recent abdominal surgery  are also affecting patient's functional outcome.  are also affecting patient's functional outcome.   REHAB POTENTIAL: Excellent  CLINICAL DECISION MAKING: Stable/uncomplicated  EVALUATION COMPLEXITY: Low  GOALS: Goals reviewed with patient? Yes  SHORT TERM GOALS: Target date: 08/12/2023  Pt will report decreased breast pain and arm pain by 25% Baseline: Goal status: INITIAL  2.  Breast complaints questionnaire will improve to 40 to demonstrate improvement Baseline:  Goal status: INITIAL  3.  Pt will be independent with left breast MLD to decrease swelling Baseline:  Goal status: INITIAL   LONG TERM GOALS: Target date: 09/02/2023  Pt will have decreased left breast and UE pain by greater than 50% Baseline:  Goal status: INITIAL  2.  Pt will have breast complaints questionnaire no greater than 25 to demonstrate improved swelling Baseline:  Goal status: INITIAL  3.  Pt will have appropriate compression garments for her arm and breast Baseline:  Goal status: INITIAL  4.  Pt will have left shoulder abd 165 for improved reaching Baseline:  Goal status: INITIAL  5.  Pt will have improved left breast swelling by 50% or greater Baseline:  Goal status: INITIAL   PLAN:  PT FREQUENCY: 2x/week  PT DURATION: 6 weeks  PLANNED INTERVENTIONS: 97164- PT Re-evaluation, 97110-Therapeutic exercises, 97530- Therapeutic activity, 97112- Neuromuscular re-education, 97535- Self Care, 28413- Manual therapy, and 97760- Orthotic Fit/training  PLAN FOR NEXT SESSION: MFR left axillary cording, initiate MLD to left breast and arm and start reviewing with pt. Was she able to get new bras?taping?  Waynette Buttery, PT 07/29/2023, 9:00 AM

## 2023-08-03 ENCOUNTER — Ambulatory Visit

## 2023-08-03 DIAGNOSIS — I890 Lymphedema, not elsewhere classified: Secondary | ICD-10-CM

## 2023-08-03 DIAGNOSIS — Z483 Aftercare following surgery for neoplasm: Secondary | ICD-10-CM

## 2023-08-03 DIAGNOSIS — R600 Localized edema: Secondary | ICD-10-CM

## 2023-08-03 DIAGNOSIS — M25612 Stiffness of left shoulder, not elsewhere classified: Secondary | ICD-10-CM

## 2023-08-03 NOTE — Therapy (Signed)
 OUTPATIENT PHYSICAL THERAPY  UPPER EXTREMITY ONCOLOGY TREATMENT  Patient Name: Sheena Jarvis MRN: [id number] DOB:03-Feb-1971, 53 y.o., female Today's Date: 08/03/2023  END OF SESSION:  PT End of Session - 08/03/23 0805     Visit Number 5    Number of Visits 12    Date for PT Re-Evaluation 09/02/23    PT Start Time 0801    PT Stop Time 0858    PT Time Calculation (min) 57 min    Activity Tolerance Patient tolerated treatment well    Behavior During Therapy [facility] for tasks assessed/performed              Past Medical History:  Diagnosis Date   Pneumonia    PONV (postoperative nausea and vomiting)    Vision abnormalities    Past Surgical History:  Procedure Laterality Date   APPENDECTOMY     BREAST BIOPSY Left 01/12/2019   Pash   BREAST BIOPSY Left 12/21/2018   benign   BREAST CYST ASPIRATION Left    BREAST EXCISIONAL BIOPSY Left 06/22/2019   Axilla BX - Benign   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   HIP ARTHROSCOPY     MASS EXCISION Left 02/10/2019   Procedure: EXCISION OF LEFT BREAST MASS;  Surgeon: Almond Lint, MD;  Location: [facility];  Service: General;  Laterality: Left;   SCAR REVISION Left 06/22/2019   Procedure: LEFT AXILLARY CONTRACTURE RELEASE;  Surgeon: Almond Lint, MD;  Location: [facility];  Service: General;  Laterality: Left;   Patient Active Problem List   Diagnosis Date Noted   Numbness 02/04/2016   Cognitive changes 02/04/2016   Other fatigue 02/04/2016      REFERRING PROVIDER: Remi Deter, MD  REFERRING DIAG: Lymphedema  THERAPY DIAG:  Lymphedema, not elsewhere classified  Aftercare following surgery for neoplasm  Stiffness of left shoulder, not elsewhere classified  Localized edema  ONSET DATE: Oct 2023 with recent exacerbation, 1 month ago(Jun 23, 2023)  Rationale for Evaluation and Treatment: Rehabilitation  SUBJECTIVE:                                                                                                                                                                                            SUBJECTIVE STATEMENT:   I just got back from another flight last night and the inside of my Lt breast is firm. Overall though it's better from when I started, I can lay on that side now.    EVAL: I got back from [place] and realized I had not worn my sleeve or compression bra on the plane. I didn't have  any problems on my way there, but I wore my compression. Several days after getting home I developed a lot of swelling in the left breast with pain at medial arm and sensation of cording in the axilla. Even my abdomen feels bad now and its not changing. I have been doing my Flexitouch and it feels better, but in the am when I wake up its bothering me again. I am wearing the [place] bra and a foam pad on the medial side. I wore the sleeve alittle, but I am so achy and heavy it makes it worse. Iron is low and I am really tired.   PERTINENT HISTORY:  Pt had recent surgery for Stage II Endometriosis on Oct 31,2024. She had a prior history of removal of benign breast mass on 02/10/2019 with complication of seroma and clogged drain and possible infection. She also developed lymphedema in her left breast and axillary cording. She receieved extensive PT with MLD, compression, manual therapy and dry needling, but had a significant persistent cord that was painful and limited shoulder ROM. She underwent an excision of that contracted scar tissue on 06/22/2019 that included one node, and develped a seroma after that.   PAIN:  Are you having pain? No, Lt breast just feels full and discomfort  PRECAUTIONS: Left UE lymphedema  RED FLAGS: None   WEIGHT BEARING RESTRICTIONS: No  FALLS:  Has patient fallen in last 6 months? 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  COGNITION: Overall cognitive status: Within functional limits for tasks assessed   PALPATION: Tenderness in axillary area of cording with tightness noted to run along the biceps  OBSERVATIONS / OTHER ASSESSMENTS: significant generalized left breast swelling and lateral trunk swelling, minimally enlarged pores, some medial fibrosis  SENSATION: Light touch:     POSTURE: WNL  UPPER EXTREMITY AROM/PROM:  A/PROM RIGHT   eval   Shoulder extension 63  Shoulder flexion 155  Shoulder abduction 177  Shoulder internal rotation   Shoulder external rotation     (Blank rows = not tested)  A/PROM LEFT   eval  Shoulder extension 50  Shoulder flexion 150, cord, impingement  Shoulder abduction 149  Shoulder internal rotation   Shoulder external rotation     (Blank rows = not tested)  CERVICAL AROM: All within normal limits:   UPPER EXTREMITY STRENGTH:   LYMPHEDEMA ASSESSMENTS:   SURGERY TYPE/DATE: 02/10/2019 Removal of Benign Breast Mass, 06/22/2019 Excision of contracted scar , Endometriosis/hysterectomy surgery 03/04/2022   NUMBER OF LYMPH NODES REMOVED: 1 left axillary with excision of contracted scar   CHEMOTHERAPY: NO  RADIATION:NO  HORMONE TREATMENT: NO  INFECTIONS: [id number] seromas after surgery   LYMPHEDEMA ASSESSMENTS:   LANDMARK RIGHT  eval  At axilla  24.9  15 cm proximal to olecranon process   10 cm proximal to olecranon process 23.6  Olecranon process 21.6  15 cm proximal to ulnar styloid process   10 cm proximal to ulnar styloid process 19.2  Just proximal to ulnar styloid process 14.45  Across hand at thumb web space 18  At base of 2nd digit 5.7  (Blank rows = not  tested)  [facility]  LEFT  eval  At axilla  25  15 cm proximal to olecranon process   10 cm proximal to olecranon process 22.6  Olecranon process 21.3  15 cm proximal to ulnar styloid process   10 cm proximal to ulnar styloid process 18.3  Just proximal to ulnar styloid process 14.4  Across hand at thumb web space 17.8  At base of 2nd digit 5.85  (Blank rows = not tested)   FUNCTIONAL TESTS:    GAIT: WNL      BREAST COMPLAINTS QUESTIONNAIRE Pain:8 Heaviness:10 Swollen feeling:10 Tense Skin:8 Redness:0 Bra Print:10 Size of Pores:1 Hard feeling: 5 Total:   52  /80 A Score over 9 indicates lymphedema issues in the breast                                                                                       TREATMENT DATE:  08/03/23: Manual Therapy MFR in supine for cording release in from breast to axilla into upper arm following cord with arm propped overhead on pillow position using opposing pressure, S pressure with pt holding breast medially for opposing pull. Also done in Rt S/L with Lt UE in varying positions and to lateral breast where very mild cording palpated in this position. PROM into flexion, abd and D2 with scapular depression and retraction during MFR STM along lateral border of scapula in supine  MLD In supine: Short neck, superficial and deep abdominals, Rt axillary and pectoral nodes, intact anterior thorax sequence and establishment of anterior inter-axillary pathway, Lt inguinal nodes and establishment of Lt axillo-inguinal pathway, then L breast moving fluid towards pathways spending extra time in any areas of fibrosis then retracing all steps, then into Rt S/L for focus to lateral breast, then finished retracing all steps in supine  07/29/2023 Supine cording release/MFR in arm propped overhead on pillow position using opposing pressure, S pressure, and incorporating wave tool along cord with pt holding breast medially for opposing pull.  PROM into flexion  and D2 MFR from axilla into lateral breast in a direction towards the belly button with pt reporting this reproduces some of her shoulder pain.  Then In supine: Short neck, superficial and deep abdominals, bil axillary nodes and establishment of interaxillary pathway, L inguinal nodes and establishment of axilloinguinal pathway, then L breast moving fluid towards pathways spending extra time in any areas of fibrosis then retracing all steps and adding left UE proximal, then medial, and lateral directing toward pathways and both sides of forearm retracing all steps and ending with LN's  07/27/23 Supine cording release/MFR in arm propped overhead on pillow position using opposing pressure, S pressure, and incorporating wave tool along cord with pt holding breast medially for opposing pull.  PROM into flexion and D2 MFR from axilla into lateral breast in a direction towards the belly button with pt reporting this reproduces some of her shoulder pain.  Then In supine: Short neck, superficial and deep abdominals, bil axillary nodes and establishment of interaxillary pathway, L inguinal nodes and establishment of axilloinguinal pathway, then L breast moving fluid towards pathways spending extra time in any areas of fibrosis then retracing all steps.  PATIENT EDUCATION:  Education details: compression bra and need for new ones, may wear at night to see if am pain is better, gave script Person educated: Patient Education method: Explanation Education comprehension: verbalized understanding  HOME EXERCISE PROGRAM:   ASSESSMENT:  CLINICAL IMPRESSION:  Pt reports feeling medial breast lymphedema flared up since returning from a flight last night so spent extra time here today with MLD and this did soften some by end of session. Also continued with MFR to cording in Lt axilla running from medial upper arm to upper/outer breast. Pt did get new compression bras and reports these fit good and they had  smaller sizes that better fit her from when she first got these 3 years ago.   OBJECTIVE IMPAIRMENTS: decreased knowledge of condition, increased edema, and impaired UE functional use.   ACTIVITY LIMITATIONS: sleeping, reach over head, and caring for others  PARTICIPATION LIMITATIONS:  exercising  PERSONAL FACTORS:  1-2 comorbidities: Breast and Left axillary surgery and recent abdominal surgery  are also affecting patient's functional outcome.  are also affecting patient's functional outcome.   REHAB POTENTIAL: Excellent  CLINICAL DECISION MAKING: Stable/uncomplicated  EVALUATION COMPLEXITY: Low  GOALS: Goals reviewed with patient? Yes  SHORT TERM GOALS: Target date: 08/12/2023  Pt will report decreased breast pain and arm pain by 25% Baseline: Goal status: INITIAL  2.  Breast complaints questionnaire will improve to 40 to demonstrate improvement Baseline:  Goal status: INITIAL  3.  Pt will be independent with left breast MLD to decrease swelling Baseline:  Goal status: INITIAL   LONG TERM GOALS: Target date: 09/02/2023  Pt will have decreased left breast and UE pain by greater than 50% Baseline:  Goal status: INITIAL  2.  Pt will have breast complaints questionnaire no greater than 25 to demonstrate improved swelling Baseline:  Goal status: INITIAL  3.  Pt will have appropriate compression garments for her arm and breast Baseline:  Goal status: INITIAL  4.  Pt will have left shoulder abd 165 for improved reaching Baseline:  Goal status: INITIAL  5.  Pt will have improved left breast swelling by 50% or greater Baseline:  Goal status: INITIAL   PLAN:  PT FREQUENCY: 2x/week  PT DURATION: 6 weeks  PLANNED INTERVENTIONS: 97164- PT Re-evaluation, 97110-Therapeutic exercises, 97530- Therapeutic activity, 97112- Neuromuscular re-education, 97535- Self Care, 26948- Manual therapy, and 97760- Orthotic Fit/training  PLAN FOR NEXT SESSION: MFR left axillary  cording, Cont MLD to left breast and arm and start reviewing with pt. Kinesiotaping?  Hermenia Bers, PTA 08/03/2023, 9:24 AM

## 2023-08-07 ENCOUNTER — Ambulatory Visit: Attending: Obstetrics and Gynecology

## 2023-08-07 DIAGNOSIS — I890 Lymphedema, not elsewhere classified: Secondary | ICD-10-CM | POA: Diagnosis present

## 2023-08-07 DIAGNOSIS — M25612 Stiffness of left shoulder, not elsewhere classified: Secondary | ICD-10-CM | POA: Insufficient documentation

## 2023-08-07 DIAGNOSIS — Z483 Aftercare following surgery for neoplasm: Secondary | ICD-10-CM | POA: Diagnosis present

## 2023-08-07 DIAGNOSIS — R600 Localized edema: Secondary | ICD-10-CM | POA: Insufficient documentation

## 2023-08-07 NOTE — Therapy (Signed)
 OUTPATIENT PHYSICAL THERAPY  UPPER EXTREMITY ONCOLOGY TREATMENT  Patient Name: Sheena Jarvis MRN: [id number] DOB:11/12/1970, 53 y.o., female Today's Date: 08/07/2023  END OF SESSION:  PT End of Session - 08/07/23 0801     Visit Number 6    Number of Visits 12    Date for PT Re-Evaluation 09/02/23    PT Start Time 0801    PT Stop Time 0855    PT Time Calculation (min) 54 min    Activity Tolerance Patient tolerated treatment well    Behavior During Therapy [facility] for tasks assessed/performed              Past Medical History:  Diagnosis Date   Pneumonia    PONV (postoperative nausea and vomiting)    Vision abnormalities    Past Surgical History:  Procedure Laterality Date   APPENDECTOMY     BREAST BIOPSY Left 01/12/2019   Pash   BREAST BIOPSY Left 12/21/2018   benign   BREAST CYST ASPIRATION Left    BREAST EXCISIONAL BIOPSY Left 06/22/2019   Axilla BX - Benign   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   HIP ARTHROSCOPY     MASS EXCISION Left 02/10/2019   Procedure: EXCISION OF LEFT BREAST MASS;  Surgeon: Almond Lint, MD;  Location: [facility];  Service: General;  Laterality: Left;   SCAR REVISION Left 06/22/2019   Procedure: LEFT AXILLARY CONTRACTURE RELEASE;  Surgeon: Almond Lint, MD;  Location: [facility];  Service: General;  Laterality: Left;   Patient Active Problem List   Diagnosis Date Noted   Numbness 02/04/2016   Cognitive changes 02/04/2016   Other fatigue 02/04/2016      REFERRING PROVIDER: Remi Deter, MD  REFERRING DIAG: Lymphedema  THERAPY DIAG:  Lymphedema, not elsewhere classified  Aftercare following surgery for neoplasm  Stiffness of left shoulder, not elsewhere classified  Localized edema  ONSET DATE: Oct 2023 with recent exacerbation, 1 month ago(Jun 23, 2023)  Rationale for Evaluation and Treatment: Rehabilitation  SUBJECTIVE:                                                                                                                                                                                            SUBJECTIVE STATEMENT:     I had an apt for [employer] [place] and they gave me a prosthesis for the right side to even the breast size. I had been getting swelling between my breasts and starting to go to the right side but that has helped.I had a rep do shock wave and laser on me on the cording yesterday.  They said it would take about 8 treatments to improve.  EVAL: I got back from [place] and realized I had not worn my sleeve or compression bra on the plane. I didn't have any problems on my way there, but I wore my compression. Several days after getting home I developed a lot of swelling in the left breast with pain at medial arm and sensation of cording in the axilla. Even my abdomen feels bad now and its not changing. I have been doing my Flexitouch and it feels better, but in the am when I wake up its bothering me again. I am wearing the [place] bra and a foam pad on the medial side. I wore the sleeve alittle, but I am so achy and heavy it makes it worse. Iron is low and I am really tired.   PERTINENT HISTORY:  Pt had recent surgery for Stage II Endometriosis on Oct 31,2024. She had a prior history of removal of benign breast mass on 02/10/2019 with complication of seroma and clogged drain and possible infection. She also developed lymphedema in her left breast and axillary cording. She receieved extensive PT with MLD, compression, manual therapy and dry needling, but had a significant persistent cord that was painful and limited shoulder ROM. She underwent an excision of that contracted scar tissue on 06/22/2019 that included one node, and develped a seroma after that.   PAIN:  Are you having pain? PAIN:  Are you having pain? Yes2/10 with compression, up to a 6/10 without pressure on it NPRS scale: 2-6/10 Pain location: left breast Pain orientation: Left  PAIN TYPE:  aching, burning, and tight Pain description: constant  Aggravating factors: not wearing compression Relieving factors: MLD,compression   PRECAUTIONS: Left UE lymphedema  RED FLAGS: None   WEIGHT BEARING RESTRICTIONS: No  FALLS:  Has patient fallen in last 6 months? 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  COGNITION: Overall cognitive status: Within functional limits for tasks assessed   PALPATION: Tenderness in axillary area of cording with tightness noted to run along the biceps  OBSERVATIONS / OTHER ASSESSMENTS: significant generalized left breast swelling and lateral trunk swelling, minimally enlarged pores, some medial fibrosis  SENSATION: Light touch:     POSTURE: WNL  UPPER EXTREMITY AROM/PROM:  A/PROM RIGHT   eval   Shoulder extension 63  Shoulder flexion 155  Shoulder abduction 177  Shoulder internal rotation   Shoulder external rotation     (Blank rows = not tested)  A/PROM LEFT   eval  Shoulder extension 50  Shoulder flexion 150, cord, impingement  Shoulder abduction 149  Shoulder internal rotation   Shoulder external rotation     (Blank rows = not tested)  CERVICAL AROM: All within normal limits:   UPPER EXTREMITY STRENGTH:   LYMPHEDEMA ASSESSMENTS:   SURGERY TYPE/DATE: 02/10/2019 Removal of Benign Breast Mass, 06/22/2019 Excision of contracted scar , Endometriosis/hysterectomy surgery 03/04/2022   NUMBER OF LYMPH NODES REMOVED: 1 left axillary with excision of contracted scar   CHEMOTHERAPY: NO  RADIATION:NO  HORMONE TREATMENT: NO  INFECTIONS:  [id number] seromas after surgery   LYMPHEDEMA ASSESSMENTS:   LANDMARK RIGHT  eval  At axilla  24.9  15 cm proximal to olecranon process   10 cm proximal to olecranon process 23.6  Olecranon process 21.6  15 cm proximal to ulnar styloid process   10 cm proximal to ulnar styloid process 19.2  Just proximal to ulnar styloid process 14.45  Across hand at thumb web space 18  At base of 2nd digit 5.7  (Blank rows = not tested)  LANDMARK LEFT  eval  At axilla  25  15 cm proximal to olecranon process   10 cm proximal to olecranon process 22.6  Olecranon process 21.3  15 cm proximal to ulnar styloid process   10 cm proximal to ulnar styloid process 18.3  Just proximal to ulnar styloid process 14.4  Across hand at thumb web space 17.8  At base of 2nd digit 5.85  (Blank rows = not tested)   FUNCTIONAL TESTS:    GAIT: WNL      BREAST COMPLAINTS QUESTIONNAIRE Pain:8 Heaviness:10 Swollen feeling:10 Tense Skin:8 Redness:0 Bra Print:10 Size of Pores:1 Hard feeling: 5 Total:   52  /80 A Score over 9 indicates lymphedema issues in the breast                                                                                       TREATMENT DATE:   08/07/2023 MFR in supine for cording release in from breast to axilla into upper arm following cord with arm propped overhead on pillow position using opposing pressure, S pressure with pt holding breast medially for opposing pull.  MLD In supine: Short neck, superficial and deep abdominals, Rt axillary and pectoral nodes, intact anterior thorax sequence and establishment of anterior inter-axillary pathway, Lt inguinal nodes and establishment of Lt axillo-inguinal pathway, then L breast moving fluid towards pathways spending extra time in any areas of fibrosis then retracing all steps, then into Rt S/L for focus to lateral breast, then finished retracing all steps in supine  08/03/23: Manual Therapy MFR in supine for cording release in  from breast to axilla into upper arm following cord with arm propped overhead on pillow position using opposing pressure, S pressure with pt holding breast medially for opposing pull. Also done in Rt S/L with Lt UE in varying positions and to lateral breast where very mild cording palpated in this position. PROM into flexion, abd and D2 with scapular depression and retraction during MFR STM along lateral border of scapula in supine  MLD In supine: Short neck, superficial and deep abdominals, Rt axillary and pectoral nodes, intact anterior thorax sequence and establishment of anterior inter-axillary pathway, Lt inguinal nodes and establishment of Lt axillo-inguinal pathway, then L breast moving fluid towards pathways spending extra time in any areas of fibrosis then retracing all steps, then into Rt S/L for focus to lateral breast, then finished retracing all steps in supine  07/29/2023 Supine cording release/MFR in arm propped overhead on pillow position using opposing pressure, S pressure, and incorporating wave tool along cord with pt holding breast medially for opposing pull.  PROM into flexion and D2 MFR from axilla into lateral breast in a direction towards the belly button with pt reporting this  reproduces some of her shoulder pain.  Then In supine: Short neck, superficial and deep abdominals, bil axillary nodes and establishment of interaxillary pathway, L inguinal nodes and establishment of axilloinguinal pathway, then L breast moving fluid towards pathways spending extra time in any areas of fibrosis then retracing all steps and adding left UE proximal, then medial, and lateral directing toward pathways and both sides of forearm retracing all steps and ending with LN's  07/27/23 Supine cording release/MFR in arm propped overhead on pillow position using opposing pressure, S pressure, and incorporating wave tool along cord with pt holding breast medially for opposing pull.  PROM into flexion and  D2 MFR from axilla into lateral breast in a direction towards the belly button with pt reporting this reproduces some of her shoulder pain.  Then In supine: Short neck, superficial and deep abdominals, bil axillary nodes and establishment of interaxillary pathway, L inguinal nodes and establishment of axilloinguinal pathway, then L breast moving fluid towards pathways spending extra time in any areas of fibrosis then retracing all steps.    PATIENT EDUCATION:  Education details: compression bra and need for new ones, may wear at night to see if am pain is better, gave script Person educated: Patient Education method: Explanation Education comprehension: verbalized understanding  HOME EXERCISE PROGRAM:   ASSESSMENT:  CLINICAL IMPRESSION: Pt purchased now bras that are comfortable and seem to be helping. Pain continues to be constant, but improved, and swelling has improved overall. Cording still present and tight.   OBJECTIVE IMPAIRMENTS: decreased knowledge of condition, increased edema, and impaired UE functional use.   ACTIVITY LIMITATIONS: sleeping, reach over head, and caring for others  PARTICIPATION LIMITATIONS:  exercising  PERSONAL FACTORS:  1-2 comorbidities: Breast and Left axillary surgery and recent abdominal surgery  are also affecting patient's functional outcome.  are also affecting patient's functional outcome.   REHAB POTENTIAL: Excellent  CLINICAL DECISION MAKING: Stable/uncomplicated  EVALUATION COMPLEXITY: Low  GOALS: Goals reviewed with patient? Yes  SHORT TERM GOALS: Target date: 08/12/2023  Pt will report decreased breast pain and arm pain by 25% Baseline: Goal status: INITIAL  2.  Breast complaints questionnaire will improve to 40 to demonstrate improvement Baseline:  Goal status: INITIAL  3.  Pt will be independent with left breast MLD to decrease swelling Baseline:  Goal status: INITIAL   LONG TERM GOALS: Target date: 09/02/2023  Pt will  have decreased left breast and UE pain by greater than 50% Baseline:  Goal status: INITIAL  2.  Pt will have breast complaints questionnaire no greater than 25 to demonstrate improved swelling Baseline:  Goal status: INITIAL  3.  Pt will have appropriate compression garments for her arm and breast Baseline:  Goal status: INITIAL  4.  Pt will have left shoulder abd 165 for improved reaching Baseline:  Goal status: INITIAL  5.  Pt will have improved left breast swelling by 50% or greater Baseline:  Goal status: INITIAL   PLAN:  PT FREQUENCY: 2x/week  PT DURATION: 6 weeks  PLANNED INTERVENTIONS: 97164- PT Re-evaluation, 97110-Therapeutic exercises, 97530- Therapeutic activity, 97112- Neuromuscular re-education, 97535- Self Care, 16109- Manual therapy, and 97760- Orthotic Fit/training  PLAN FOR NEXT SESSION: MFR left axillary cording, Cont MLD to left breast and arm and start reviewing with pt. Kinesiotaping?  Waynette Buttery, PT 08/07/2023, 8:58 AM

## 2023-08-10 ENCOUNTER — Ambulatory Visit

## 2023-08-10 DIAGNOSIS — I890 Lymphedema, not elsewhere classified: Secondary | ICD-10-CM

## 2023-08-10 DIAGNOSIS — R600 Localized edema: Secondary | ICD-10-CM

## 2023-08-10 DIAGNOSIS — Z483 Aftercare following surgery for neoplasm: Secondary | ICD-10-CM

## 2023-08-10 DIAGNOSIS — M25612 Stiffness of left shoulder, not elsewhere classified: Secondary | ICD-10-CM

## 2023-08-10 NOTE — Therapy (Signed)
 OUTPATIENT PHYSICAL THERAPY  UPPER EXTREMITY ONCOLOGY TREATMENT  Patient Name: Sheena Jarvis MRN: [id number] DOB:1971-04-09, 53 y.o., female Today's Date: 08/10/2023  END OF SESSION:  PT End of Session - 08/10/23 1508     Visit Number 7    Number of Visits 12    Date for PT Re-Evaluation 09/02/23    PT Start Time 1505    PT Stop Time 1559    PT Time Calculation (min) 54 min    Activity Tolerance Patient tolerated treatment well    Behavior During Therapy [facility] for tasks assessed/performed              Past Medical History:  Diagnosis Date   Pneumonia    PONV (postoperative nausea and vomiting)    Vision abnormalities    Past Surgical History:  Procedure Laterality Date   APPENDECTOMY     BREAST BIOPSY Left 01/12/2019   Pash   BREAST BIOPSY Left 12/21/2018   benign   BREAST CYST ASPIRATION Left    BREAST EXCISIONAL BIOPSY Left 06/22/2019   Axilla BX - Benign   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   HIP ARTHROSCOPY     MASS EXCISION Left 02/10/2019   Procedure: EXCISION OF LEFT BREAST MASS;  Surgeon: Almond Lint, MD;  Location: [facility];  Service: General;  Laterality: Left;   SCAR REVISION Left 06/22/2019   Procedure: LEFT AXILLARY CONTRACTURE RELEASE;  Surgeon: Almond Lint, MD;  Location: [facility];  Service: General;  Laterality: Left;   Patient Active Problem List   Diagnosis Date Noted   Numbness 02/04/2016   Cognitive changes 02/04/2016   Other fatigue 02/04/2016      REFERRING PROVIDER: Remi Deter, MD  REFERRING DIAG: Lymphedema  THERAPY DIAG:  Lymphedema, not elsewhere classified  Aftercare following surgery for neoplasm  Stiffness of left shoulder, not elsewhere classified  Localized edema  ONSET DATE: Oct 2023 with recent exacerbation, 1 month ago(Jun 23, 2023)  Rationale for Evaluation and Treatment: Rehabilitation  SUBJECTIVE:                                                                                                                                                                                            SUBJECTIVE STATEMENT:  I've been dealing with low BP. The new compression bras are working out well.      EVAL: I got back from [place] and realized I had not worn my sleeve or compression bra on the plane. I didn't have any problems on my way there, but I wore my compression. Several days after getting home I developed  a lot of swelling in the left breast with pain at medial arm and sensation of cording in the axilla. Even my abdomen feels bad now and its not changing. I have been doing my Flexitouch and it feels better, but in the am when I wake up its bothering me again. I am wearing the [place] bra and a foam pad on the medial side. I wore the sleeve alittle, but I am so achy and heavy it makes it worse. Iron is low and I am really tired.   PERTINENT HISTORY:  Pt had recent surgery for Stage II Endometriosis on Oct 31,2024. She had a prior history of removal of benign breast mass on 02/10/2019 with complication of seroma and clogged drain and possible infection. She also developed lymphedema in her left breast and axillary cording. She receieved extensive PT with MLD, compression, manual therapy and dry needling, but had a significant persistent cord that was painful and limited shoulder ROM. She underwent an excision of that contracted scar tissue on 06/22/2019 that included one node, and develped a seroma after that.   PAIN:  Are you having pain? PAIN:  Are you having pain? Yes NPRS scale: 5/10 Pain location: left breast Pain orientation: Left  PAIN TYPE: stinging and burning Pain description: constant  Aggravating factors: not wearing compression Relieving factors: MLD,compression   PRECAUTIONS: Left UE lymphedema  RED FLAGS: None   WEIGHT BEARING RESTRICTIONS: No  FALLS:  Has patient fallen in last 6 months? 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  COGNITION: Overall cognitive status: Within functional limits for tasks assessed   PALPATION: Tenderness in axillary area of cording with tightness noted to run along the biceps  OBSERVATIONS / OTHER ASSESSMENTS: significant generalized left breast swelling and lateral trunk swelling, minimally enlarged pores, some medial fibrosis  SENSATION: Light touch:     POSTURE: WNL  UPPER EXTREMITY AROM/PROM:  A/PROM RIGHT   eval   Shoulder extension 63  Shoulder flexion 155  Shoulder abduction 177  Shoulder internal rotation   Shoulder external rotation     (Blank rows = not tested)  A/PROM LEFT   eval  Shoulder extension 50  Shoulder flexion 150, cord, impingement  Shoulder abduction 149  Shoulder internal rotation   Shoulder external rotation     (Blank rows = not tested)  CERVICAL AROM: All within normal limits:   UPPER EXTREMITY STRENGTH:   LYMPHEDEMA ASSESSMENTS:   SURGERY TYPE/DATE: 02/10/2019 Removal of Benign Breast Mass, 06/22/2019 Excision of contracted scar , Endometriosis/hysterectomy surgery 03/04/2022   NUMBER OF LYMPH NODES REMOVED: 1 left axillary with excision of contracted scar   CHEMOTHERAPY: NO  RADIATION:NO  HORMONE TREATMENT: NO  INFECTIONS: [id number] seromas after surgery   LYMPHEDEMA ASSESSMENTS:   LANDMARK RIGHT  eval  At axilla  24.9  15 cm proximal to olecranon process   10 cm proximal to olecranon process 23.6  Olecranon process 21.6  15 cm proximal to ulnar styloid process   10 cm proximal to ulnar styloid process 19.2  Just proximal to  ulnar styloid process 14.45  Across hand at thumb web space 18  At base of 2nd  digit 5.7  (Blank rows = not tested)  LANDMARK LEFT  eval  At axilla  25  15 cm proximal to olecranon process   10 cm proximal to olecranon process 22.6  Olecranon process 21.3  15 cm proximal to ulnar styloid process   10 cm proximal to ulnar styloid process 18.3  Just proximal to ulnar styloid process 14.4  Across hand at thumb web space 17.8  At base of 2nd digit 5.85  (Blank rows = not tested)   FUNCTIONAL TESTS:    GAIT: WNL      BREAST COMPLAINTS QUESTIONNAIRE Pain:8 Heaviness:10 Swollen feeling:10 Tense Skin:8 Redness:0 Bra Print:10 Size of Pores:1 Hard feeling: 5 Total:   52  /80 A Score over 9 indicates lymphedema issues in the breast                                                                                       TREATMENT DATE:  08/10/23: Manual Therapy MFR in supine for cording release in from breast to axilla into upper arm following cord with arm propped overhead on pillow position using opposing pressure and prolonged holds along breast cord that extends into axilla and medial upper arm, with pt holding breast medially for opposing pull.  MLD In supine: Short neck, superficial and deep abdominals, Rt axillary and pectoral nodes, intact anterior thorax sequence and establishment of anterior inter-axillary pathway, Lt inguinal nodes and establishment of Lt axillo-inguinal pathway, then L breast moving fluid towards pathways spending extra time in any areas of fibrosis then retracing all steps, then finished retracing all steps in supine  08/07/2023 MFR in supine for cording release in from breast to axilla into upper arm following cord with arm propped overhead on pillow position using opposing pressure, S pressure with pt holding breast medially for opposing pull.  MLD In supine: Short neck, superficial and deep abdominals, Rt axillary and pectoral nodes, intact anterior thorax  sequence and establishment of anterior inter-axillary pathway, Lt inguinal nodes and establishment of Lt axillo-inguinal pathway, then L breast moving fluid towards pathways spending extra time in any areas of fibrosis then retracing all steps, then into Rt S/L for focus to lateral breast, then finished retracing all steps in supine  08/03/23: Manual Therapy MFR in supine for cording release in from breast to axilla into upper arm following cord with arm propped overhead on pillow position using opposing pressure, S pressure with pt holding breast medially for opposing pull. Also done in Rt S/L with Lt UE in varying positions and to lateral breast where very mild cording palpated in this position. PROM into flexion, abd and D2 with scapular depression and retraction during MFR STM along lateral border of scapula in supine  MLD In supine: Short neck, superficial and deep abdominals, Rt axillary and pectoral nodes, intact anterior thorax sequence and establishment of anterior inter-axillary pathway, Lt inguinal nodes and establishment of Lt axillo-inguinal pathway, then L breast moving fluid towards pathways spending extra time in any areas of fibrosis then retracing all steps, then into Rt S/L for focus to lateral breast, then finished retracing all steps in supine  07/29/2023 Supine cording release/MFR in  arm propped overhead on pillow position using opposing pressure, S pressure, and incorporating wave tool along cord with pt holding breast medially for opposing pull.  PROM into flexion and D2 MFR from axilla into lateral breast in a direction towards the belly button with pt reporting this reproduces some of her shoulder pain.  Then In supine: Short neck, superficial and deep abdominals, bil axillary nodes and establishment of interaxillary pathway, L inguinal nodes and establishment of axilloinguinal pathway, then L breast moving fluid towards pathways spending extra time in any areas of fibrosis then  retracing all steps and adding left UE proximal, then medial, and lateral directing toward pathways and both sides of forearm retracing all steps and ending with LN's     PATIENT EDUCATION:  Education details: compression bra and need for new ones, may wear at night to see if am pain is better, gave script Person educated: Patient Education method: Explanation Education comprehension: verbalized understanding  HOME EXERCISE PROGRAM:   ASSESSMENT:  CLINICAL IMPRESSION: Pt reports new compression bras are really help to soften medial fibrosis as her Lt breast isn't being pushed medially in the bra. Continued MFR to breast to upper arm cording, this has improved some since this therapist saw her last. Then also continued MLD to Lt breast. Medial breast is much improved with continued MLD and wear of compression bras.    OBJECTIVE IMPAIRMENTS: decreased knowledge of condition, increased edema, and impaired UE functional use.   ACTIVITY LIMITATIONS: sleeping, reach over head, and caring for others  PARTICIPATION LIMITATIONS:  exercising  PERSONAL FACTORS:  1-2 comorbidities: Breast and Left axillary surgery and recent abdominal surgery  are also affecting patient's functional outcome.  are also affecting patient's functional outcome.   REHAB POTENTIAL: Excellent  CLINICAL DECISION MAKING: Stable/uncomplicated  EVALUATION COMPLEXITY: Low  GOALS: Goals reviewed with patient? Yes  SHORT TERM GOALS: Target date: 08/12/2023  Pt will report decreased breast pain and arm pain by 25% Baseline: Goal status: INITIAL  2.  Breast complaints questionnaire will improve to 40 to demonstrate improvement Baseline:  Goal status: INITIAL  3.  Pt will be independent with left breast MLD to decrease swelling Baseline:  Goal status: INITIAL   LONG TERM GOALS: Target date: 09/02/2023  Pt will have decreased left breast and UE pain by greater than 50% Baseline:  Goal status: INITIAL  2.  Pt  will have breast complaints questionnaire no greater than 25 to demonstrate improved swelling Baseline:  Goal status: INITIAL  3.  Pt will have appropriate compression garments for her arm and breast Baseline:  Goal status: INITIAL  4.  Pt will have left shoulder abd 165 for improved reaching Baseline:  Goal status: INITIAL  5.  Pt will have improved left breast swelling by 50% or greater Baseline:  Goal status: INITIAL   PLAN:  PT FREQUENCY: 2x/week  PT DURATION: 6 weeks  PLANNED INTERVENTIONS: 97164- PT Re-evaluation, 97110-Therapeutic exercises, 97530- Therapeutic activity, 97112- Neuromuscular re-education, 97535- Self Care, 16109- Manual therapy, and 97760- Orthotic Fit/training  PLAN FOR NEXT SESSION: MFR left axillary cording, Cont MLD to left breast and arm and start reviewing with pt. Kinesiotaping?  Hermenia Bers, PTA 08/10/2023, 4:05 PM

## 2023-08-11 ENCOUNTER — Ambulatory Visit

## 2023-08-11 DIAGNOSIS — Z483 Aftercare following surgery for neoplasm: Secondary | ICD-10-CM

## 2023-08-11 DIAGNOSIS — M25612 Stiffness of left shoulder, not elsewhere classified: Secondary | ICD-10-CM

## 2023-08-11 DIAGNOSIS — I890 Lymphedema, not elsewhere classified: Secondary | ICD-10-CM

## 2023-08-11 DIAGNOSIS — R600 Localized edema: Secondary | ICD-10-CM

## 2023-08-11 NOTE — Therapy (Addendum)
 OUTPATIENT PHYSICAL THERAPY  UPPER EXTREMITY ONCOLOGY TREATMENT  Patient Name: Sheena Jarvis MRN: [id number] DOB:[birth year], 53 y.o., female Today's Date: 08/11/2023  END OF SESSION:  PT End of Session - 08/11/23 1600     Visit Number 8    Number of Visits 12    Date for PT Re-Evaluation 09/02/23    PT Start Time 1601    PT Stop Time 1655    PT Time Calculation (min) 54 min    Activity Tolerance Patient tolerated treatment well    Behavior During Therapy WFL for tasks assessed/performed              Past Medical History:  Diagnosis Date   Pneumonia    PONV (postoperative nausea and vomiting)    Vision abnormalities    Past Surgical History:  Procedure Laterality Date   APPENDECTOMY     BREAST BIOPSY Left 01/12/2019   Pash   BREAST BIOPSY Left 12/21/2018   benign   BREAST CYST ASPIRATION Left    BREAST EXCISIONAL BIOPSY Left 06/22/2019   Axilla BX - Benign   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   HIP ARTHROSCOPY     MASS EXCISION Left 02/10/2019   Procedure: EXCISION OF LEFT BREAST MASS;  Surgeon: Lockie Rima, MD;  Location: [facility];  Service: General;  Laterality: Left;   SCAR REVISION Left 06/22/2019   Procedure: LEFT AXILLARY CONTRACTURE RELEASE;  Surgeon: Lockie Rima, MD;  Location: [facility];  Service: General;  Laterality: Left;   Patient Active Problem List   Diagnosis Date Noted   Numbness 02/04/2016   Cognitive changes 02/04/2016   Other fatigue 02/04/2016      REFERRING PROVIDER: Artemio Bilberry, MD  REFERRING DIAG: Lymphedema  THERAPY DIAG:  Lymphedema, not elsewhere classified  Aftercare following surgery for neoplasm  Stiffness of left shoulder, not elsewhere classified  Localized edema  ONSET DATE: Oct 2023 with recent exacerbation, 1 month ago(Jun 23, 2023)  Rationale for Evaluation and Treatment: Rehabilitation  SUBJECTIVE:                                                                                                                                                                                            SUBJECTIVE STATEMENT:  My BP this AM was 80 over 44. My iron still hasn't gone up. I see a cardiologist next week for POTTS and have a brain scan for neurology. Swelling is gradually getting better.Last night I had swelling and redness in the left groin area. Not sure what that is from.Pain still occurs at rest from the breast swelling, but  its about 33% better.   EVAL: I got back from [place]  and realized I had not worn my sleeve or compression bra on the plane. I didn't have any problems on my way there, but I wore my compression. Several days after getting home I developed a lot of swelling in the left breast with pain at medial arm and sensation of cording in the axilla. Even my abdomen feels bad now and its not changing. I have been doing my Flexitouch and it feels better, but in the am when I wake up its bothering me again. I am wearing the [place] bra and a foam pad on the medial side. I wore the sleeve alittle, but I am so achy and heavy it makes it worse. Iron is low and I am really tired.   PERTINENT HISTORY:  Pt had recent surgery for Stage II Endometriosis on Oct 31,2024. She had a prior history of removal of benign breast mass on 02/10/2019 with complication of seroma and clogged drain and possible infection. She also developed lymphedema in her left breast and axillary cording. She receieved extensive PT with MLD, compression, manual therapy and dry needling, but had a significant persistent cord that was painful and limited shoulder ROM. She underwent an excision of that contracted scar tissue on 06/22/2019 that included one node, and develped a seroma after that.   PAIN:  Are you having pain? PAIN:  Are you having pain? Yes NPRS scale: 5/10 Pain location: left breast Pain orientation: Left  PAIN TYPE: stinging and burning Pain description: constant   Aggravating factors: not wearing compression Relieving factors: MLD,compression   PRECAUTIONS: Left UE lymphedema  RED FLAGS: None   WEIGHT BEARING RESTRICTIONS: No  FALLS:  Has patient fallen in last 6 months? 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  COGNITION: Overall cognitive status: Within functional limits for tasks assessed   PALPATION: Tenderness in axillary area of cording with tightness noted to run along the biceps  OBSERVATIONS / OTHER ASSESSMENTS: significant generalized left breast swelling and lateral trunk swelling, minimally enlarged pores, some medial fibrosis  SENSATION: Light touch:     POSTURE: WNL  UPPER EXTREMITY AROM/PROM:  A/PROM RIGHT   eval   Shoulder extension 63  Shoulder flexion 155  Shoulder abduction 177  Shoulder internal rotation   Shoulder external rotation     (Blank rows = not tested)  A/PROM LEFT   eval  Shoulder extension 50  Shoulder flexion 150, cord, impingement  Shoulder abduction 149  Shoulder internal rotation   Shoulder external rotation     (Blank rows = not tested)  CERVICAL AROM: All within normal limits:   UPPER EXTREMITY STRENGTH:   LYMPHEDEMA ASSESSMENTS:   SURGERY TYPE/DATE: 02/10/2019 Removal of Benign Breast Mass, 06/22/2019 Excision of contracted scar , Endometriosis/hysterectomy surgery 03/04/2022   NUMBER OF LYMPH NODES REMOVED: 1 left axillary with excision of contracted scar   CHEMOTHERAPY: NO  RADIATION:NO  HORMONE TREATMENT: NO  INFECTIONS: WGN:FAOZHYQM seromas after surgery   LYMPHEDEMA  ASSESSMENTS:   LANDMARK RIGHT  eval  At axilla  24.9  15 cm proximal to olecranon process   10 cm proximal  to olecranon process 23.6  Olecranon process 21.6  15 cm proximal to ulnar styloid process   10 cm proximal to ulnar styloid process 19.2  Just proximal to ulnar styloid process 14.45  Across hand at thumb web space 18  At base of 2nd digit 5.7  (Blank rows = not tested)  LANDMARK LEFT  eval  At axilla  25  15 cm proximal to olecranon process   10 cm proximal to olecranon process 22.6  Olecranon process 21.3  15 cm proximal to ulnar styloid process   10 cm proximal to ulnar styloid process 18.3  Just proximal to ulnar styloid process 14.4  Across hand at thumb web space 17.8  At base of 2nd digit 5.85  (Blank rows = not tested)   FUNCTIONAL TESTS:    GAIT: WNL      BREAST COMPLAINTS QUESTIONNAIRE Pain:8 Heaviness:10 Swollen feeling:10 Tense Skin:8 Redness:0 Bra Print:10 Size of Pores:1 Hard feeling: 5 Total:   52  /80 A Score over 9 indicates lymphedema issues in the breast                                                                                       TREATMENT DATE:  08/11/2023 MFR in supine for cording release in from breast to axilla into upper arm following cord with arm propped overhead on pillow position using opposing pressure, "S" technique  and prolonged holds along breast cord that extends into axilla and medial upper arm MLD In supine: Short neck, superficial and deep abdominals, Rt and left axillary LN's and right pectoral nodes, intact anterior thorax sequence and establishment of anterior inter-axillary pathway, Lt inguinal nodes and establishment of Lt axillo-inguinal pathway, then L breast moving fluid towards pathways spending extra time in any areas of fibrosis then retracing all steps, then finished retracing all steps in supine.   08/10/23: Manual Therapy MFR in supine for cording release in from breast to axilla into upper arm  following cord with arm propped overhead on pillow position using opposing pressure, "S" technique  and prolonged holds along breast cord that extends into axilla and medial upper arm, with pt holding breast medially for opposing pull.  MLD In supine: Short neck, superficial and deep abdominals, Rt and left axillary LN's and right pectoral nodes, intact anterior thorax sequence and establishment of anterior inter-axillary pathway, Lt inguinal nodes and establishment of Lt axillo-inguinal pathway, then L breast moving fluid towards pathways spending extra time in any areas of fibrosis then retracing all steps, then finished retracing all steps in supine. Bilateral breasts observed post rx; left breast swelling improved but still considerably swollen. Discussed wearing compression bra at night if she can be comfortable.  08/07/2023 MFR in supine for cording release in from breast to axilla into upper arm following cord with arm propped overhead on pillow position using opposing pressure, S pressure with pt holding breast medially for opposing pull.  MLD In supine: Short neck, superficial and deep abdominals, Rt axillary and pectoral nodes, intact anterior thorax sequence and establishment of anterior inter-axillary pathway, Lt inguinal nodes and establishment of Lt axillo-inguinal pathway, then L breast moving fluid towards pathways  spending extra time in any areas of fibrosis then retracing all steps, then into Rt S/L for focus to lateral breast, then finished retracing all steps in supine  08/03/23: Manual Therapy MFR in supine for cording release in from breast to axilla into upper arm following cord with arm propped overhead on pillow position using opposing pressure, S pressure with pt holding breast medially for opposing pull. Also done in Rt S/L with Lt UE in varying positions and to lateral breast where very mild cording palpated in this position. PROM into flexion, abd and D2 with scapular depression  and retraction during MFR STM along lateral border of scapula in supine  MLD In supine: Short neck, superficial and deep abdominals, Rt axillary and pectoral nodes, intact anterior thorax sequence and establishment of anterior inter-axillary pathway, Lt inguinal nodes and establishment of Lt axillo-inguinal pathway, then L breast moving fluid towards pathways spending extra time in any areas of fibrosis then retracing all steps, then into Rt S/L for focus to lateral breast, then finished retracing all steps in supine  07/29/2023 Supine cording release/MFR in arm propped overhead on pillow position using opposing pressure, S pressure, and incorporating wave tool along cord with pt holding breast medially for opposing pull.  PROM into flexion and D2 MFR from axilla into lateral breast in a direction towards the belly button with pt reporting this reproduces some of her shoulder pain.  Then In supine: Short neck, superficial and deep abdominals, bil axillary nodes and establishment of interaxillary pathway, L inguinal nodes and establishment of axilloinguinal pathway, then L breast moving fluid towards pathways spending extra time in any areas of fibrosis then retracing all steps and adding left UE proximal, then medial, and lateral directing toward pathways and both sides of forearm retracing all steps and ending with LN's     PATIENT EDUCATION:  Education details: compression bra and need for new ones, may wear at night to see if am pain is better, gave script Person educated: Patient Education method: Explanation Education comprehension: verbalized understanding  HOME EXERCISE PROGRAM:   ASSESSMENT:  CLINICAL IMPRESSION: Pt notes breast swelling improved about 33% and therapist agrees. Improvement noted but still very swollen. Discussed pt considering wearing compression bra at night to see if that would help if she can be comfortable sleeping. Pt is in process of having other medical testing  done to r/o POTTS, MS, Epstein Barr etc. Pts left arm pain/swelling improved.  OBJECTIVE IMPAIRMENTS: decreased knowledge of condition, increased edema, and impaired UE functional use.   ACTIVITY LIMITATIONS: sleeping, reach over head, and caring for others  PARTICIPATION LIMITATIONS:  exercising  PERSONAL FACTORS:  1-2 comorbidities: Breast and Left axillary surgery and recent abdominal surgery  are also affecting patient's functional outcome.  are also affecting patient's functional outcome.   REHAB POTENTIAL: Excellent  CLINICAL DECISION MAKING: Stable/uncomplicated  EVALUATION COMPLEXITY: Low  GOALS: Goals reviewed with patient? Yes  SHORT TERM GOALS: Target date: 08/12/2023  Pt will report decreased breast pain and arm pain by 25% Baseline: Goal status: INITIAL  2.  Breast complaints questionnaire will improve to 40 to demonstrate improvement Baseline:  Goal status: INITIAL  3.  Pt will be independent with left breast MLD to decrease swelling Baseline:  Goal status: INITIAL   LONG TERM GOALS: Target date: 09/02/2023  Pt will have decreased left breast and UE pain by greater than 50% Baseline:  Goal status: INITIAL  2.  Pt will have breast complaints questionnaire no greater than 25  to demonstrate improved swelling Baseline:  Goal status: INITIAL  3.  Pt will have appropriate compression garments for her arm and breast Baseline:  Goal status: INITIAL  4.  Pt will have left shoulder abd 165 for improved reaching Baseline:  Goal status: INITIAL  5.  Pt will have improved left breast swelling by 50% or greater Baseline:  Goal status: INITIAL   PLAN:  PT FREQUENCY: 2x/week  PT DURATION: 6 weeks  PLANNED INTERVENTIONS: 97164- PT Re-evaluation, 97110-Therapeutic exercises, 97530- Therapeutic activity, 97112- Neuromuscular re-education, 97535- Self Care, [id number]- Manual therapy, and 97760- Orthotic Fit/training  PLAN FOR NEXT SESSION: MFR left axillary  cording, Cont MLD to left breast and arm and start reviewing with pt. Kinesiotaping?  Latisha Poland, PT 08/11/2023, 5:05 PM

## 2023-08-17 ENCOUNTER — Ambulatory Visit

## 2023-08-17 DIAGNOSIS — R600 Localized edema: Secondary | ICD-10-CM

## 2023-08-17 DIAGNOSIS — I890 Lymphedema, not elsewhere classified: Secondary | ICD-10-CM | POA: Diagnosis not present

## 2023-08-17 DIAGNOSIS — Z483 Aftercare following surgery for neoplasm: Secondary | ICD-10-CM

## 2023-08-17 DIAGNOSIS — M25612 Stiffness of left shoulder, not elsewhere classified: Secondary | ICD-10-CM

## 2023-08-17 NOTE — Therapy (Addendum)
 OUTPATIENT PHYSICAL THERAPY  UPPER EXTREMITY ONCOLOGY TREATMENT  Patient Name: Sheena Jarvis MRN: [id number] DOB:[birth year], 53 y.o., female Today's Date: 08/17/2023  END OF SESSION:  PT End of Session - 08/17/23 1113     Visit Number 9    Number of Visits 12    Date for PT Re-Evaluation 09/02/23    PT Start Time 1108    PT Stop Time 1204    PT Time Calculation (min) 56 min    Activity Tolerance Patient tolerated treatment well    Behavior During Therapy WFL for tasks assessed/performed              Past Medical History:  Diagnosis Date   Pneumonia    PONV (postoperative nausea and vomiting)    Vision abnormalities    Past Surgical History:  Procedure Laterality Date   APPENDECTOMY     BREAST BIOPSY Left 01/12/2019   Pash   BREAST BIOPSY Left 12/21/2018   benign   BREAST CYST ASPIRATION Left    BREAST EXCISIONAL BIOPSY Left 06/22/2019   Axilla BX - Benign   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   HIP ARTHROSCOPY     MASS EXCISION Left 02/10/2019   Procedure: EXCISION OF LEFT BREAST MASS;  Surgeon: Lockie Rima, MD;  Location: [facility];  Service: General;  Laterality: Left;   SCAR REVISION Left 06/22/2019   Procedure: LEFT AXILLARY CONTRACTURE RELEASE;  Surgeon: Lockie Rima, MD;  Location: [facility];  Service: General;  Laterality: Left;   Patient Active Problem List   Diagnosis Date Noted   Numbness 02/04/2016   Cognitive changes 02/04/2016   Other fatigue 02/04/2016      REFERRING PROVIDER: Artemio Bilberry, MD  REFERRING DIAG: Lymphedema  THERAPY DIAG:  Lymphedema, not elsewhere classified  Aftercare following surgery for neoplasm  Stiffness of left shoulder, not elsewhere classified  Localized edema  ONSET DATE: Oct 2023 with recent exacerbation, 1 month ago(Jun 23, 2023)  Rationale for Evaluation and Treatment: Rehabilitation  SUBJECTIVE:                                                                                                                                                                                            SUBJECTIVE STATEMENT:  I had a scan last Friday and will see a cardiologist this Friday about that since my BP has been going so low. Overall my breast is much better than it was.   EVAL: I got back from [place]  and realized I had not worn my sleeve or compression bra on the plane. I didn't have any problems on  my way there, but I wore my compression. Several days after getting home I developed a lot of swelling in the left breast with pain at medial arm and sensation of cording in the axilla. Even my abdomen feels bad now and its not changing. I have been doing my Flexitouch and it feels better, but in the am when I wake up its bothering me again. I am wearing the [place] bra and a foam pad on the medial side. I wore the sleeve alittle, but I am so achy and heavy it makes it worse. Iron is low and I am really tired.   PERTINENT HISTORY:  Pt had recent surgery for Stage II Endometriosis on Oct 31,2024. She had a prior history of removal of benign breast mass on 02/10/2019 with complication of seroma and clogged drain and possible infection. She also developed lymphedema in her left breast and axillary cording. She receieved extensive PT with MLD, compression, manual therapy and dry needling, but had a significant persistent cord that was painful and limited shoulder ROM. She underwent an excision of that contracted scar tissue on 06/22/2019 that included one node, and develped a seroma after that.   PAIN:  Are you having pain? PAIN:  Are you having pain? Yes NPRS scale: 5/10 Pain location: left breast Pain orientation: Left  PAIN TYPE: stinging and burning Pain description: constant  Aggravating factors: not wearing compression Relieving factors: MLD,compression   PRECAUTIONS: Left UE lymphedema  RED FLAGS: None   WEIGHT BEARING RESTRICTIONS: No  FALLS:  Has  patient fallen in last 6 months? 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  COGNITION: Overall cognitive status: Within functional limits for tasks assessed   PALPATION: Tenderness in axillary area of cording with tightness noted to run along the biceps  OBSERVATIONS / OTHER ASSESSMENTS: significant generalized left breast swelling and lateral trunk swelling, minimally enlarged pores, some medial fibrosis  SENSATION: Light touch:     POSTURE: WNL  UPPER EXTREMITY AROM/PROM:  A/PROM RIGHT   eval   Shoulder extension 63  Shoulder flexion 155  Shoulder abduction 177  Shoulder internal rotation   Shoulder external rotation     (Blank rows = not tested)  A/PROM LEFT   eval  Shoulder extension 50  Shoulder flexion 150, cord, impingement  Shoulder abduction 149  Shoulder internal rotation   Shoulder external rotation     (Blank rows = not tested)  CERVICAL AROM: All within normal limits:   UPPER EXTREMITY STRENGTH:   LYMPHEDEMA ASSESSMENTS:   SURGERY TYPE/DATE: 02/10/2019 Removal of Benign Breast Mass, 06/22/2019 Excision of contracted scar , Endometriosis/hysterectomy surgery 03/04/2022   NUMBER OF LYMPH NODES REMOVED: 1 left axillary with excision of contracted scar   CHEMOTHERAPY: NO  RADIATION:NO  HORMONE TREATMENT: NO  INFECTIONS: [id number] seromas after surgery   LYMPHEDEMA ASSESSMENTS:   LANDMARK RIGHT  eval  At axilla  24.9  15 cm proximal to olecranon process   10 cm proximal to olecranon process 23.6  Olecranon process 21.6  15 cm proximal to ulnar  styloid process   10 cm proximal to ulnar styloid process 19.2  Just proximal to ulnar styloid  process 14.45  Across hand at thumb web space 18  At base of 2nd digit 5.7  (Blank rows = not tested)  LANDMARK LEFT  eval  At axilla  25  15 cm proximal to olecranon process   10 cm proximal to olecranon process 22.6  Olecranon process 21.3  15 cm proximal to ulnar styloid process   10 cm proximal to ulnar styloid process 18.3  Just proximal to ulnar styloid process 14.4  Across hand at thumb web space 17.8  At base of 2nd digit 5.85  (Blank rows = not tested)   FUNCTIONAL TESTS:    GAIT: WNL      BREAST COMPLAINTS QUESTIONNAIRE Pain:8 Heaviness:10 Swollen feeling:10 Tense Skin:8 Redness:0 Bra Print:10 Size of Pores:1 Hard feeling: 5 Total:   52  /80 A Score over 9 indicates lymphedema issues in the breast                                                                                       TREATMENT DATE:  08/17/23: Manual Therapy MFR in supine for cording release in from breast to axilla into upper arm following cord with arm propped overhead on pillow position using opposing pressure, "S" technique  and prolonged holds along breast cord that extends into axilla and medial upper arm, with pt holding breast medially for opposing pull.  MLD In supine: Short neck, superficial and deep abdominals, Rt and left axillary LN's and right pectoral nodes, intact anterior thorax sequence and establishment of anterior inter-axillary pathway, Lt inguinal nodes and establishment of Lt axillo-inguinal pathway, then L breast moving fluid towards pathways spending extra time in any areas of fibrosis then retracing all steps, then finished retracing all steps in supine. Bilateral breasts observed post rx; left breast swelling improved but still considerably swollen. Discussed wearing compression bra at night if she can be comfortable. Trial of kinesiotape along Lt axillo-inguinal anastomosis:  bucket placed inferior to transverse watershed and 2 fingers running along lateral trunk to lateral breast. Pt is familiar with kinesiotape so knows to remove if noting skin irritation or reaction.   08/11/2023 MFR in supine for cording release in from breast to axilla into upper arm following cord with arm propped overhead on pillow position using opposing pressure, "S" technique  and prolonged holds along breast cord that extends into axilla and medial upper arm MLD In supine: Short neck, superficial and deep abdominals, Rt and left axillary LN's and right pectoral nodes, intact anterior thorax sequence and establishment of anterior inter-axillary pathway, Lt inguinal nodes and establishment of Lt axillo-inguinal pathway, then L breast moving fluid towards pathways spending extra time in any areas of fibrosis then retracing all steps, then finished retracing all steps in supine.    08/10/23: Manual Therapy MFR in supine for cording release in from breast to axilla into upper arm following cord with arm propped overhead on pillow position using opposing pressure, "S" technique  and prolonged holds along breast cord that extends into axilla and medial upper arm, with pt holding breast medially for opposing pull.  MLD In supine: Short neck, superficial and deep abdominals, Rt and left axillary LN's and  right pectoral nodes, intact anterior thorax sequence and establishment of anterior inter-axillary pathway, Lt inguinal nodes and establishment of Lt axillo-inguinal pathway, then L breast moving fluid towards pathways spending extra time in any areas of fibrosis then retracing all steps, then finished retracing all steps in supine. Bilateral breasts observed post rx; left breast swelling improved but still considerably swollen. Discussed wearing compression bra at night if she can be comfortable.  08/07/2023 MFR in supine for cording release in from breast to axilla into upper arm following cord with arm  propped overhead on pillow position using opposing pressure, S pressure with pt holding breast medially for opposing pull.  MLD In supine: Short neck, superficial and deep abdominals, Rt axillary and pectoral nodes, intact anterior thorax sequence and establishment of anterior inter-axillary pathway, Lt inguinal nodes and establishment of Lt axillo-inguinal pathway, then L breast moving fluid towards pathways spending extra time in any areas of fibrosis then retracing all steps, then into Rt S/L for focus to lateral breast, then finished retracing all steps in supine  08/03/23: Manual Therapy MFR in supine for cording release in from breast to axilla into upper arm following cord with arm propped overhead on pillow position using opposing pressure, S pressure with pt holding breast medially for opposing pull. Also done in Rt S/L with Lt UE in varying positions and to lateral breast where very mild cording palpated in this position. PROM into flexion, abd and D2 with scapular depression and retraction during MFR STM along lateral border of scapula in supine  MLD In supine: Short neck, superficial and deep abdominals, Rt axillary and pectoral nodes, intact anterior thorax sequence and establishment of anterior inter-axillary pathway, Lt inguinal nodes and establishment of Lt axillo-inguinal pathway, then L breast moving fluid towards pathways spending extra time in any areas of fibrosis then retracing all steps, then into Rt S/L for focus to lateral breast, then finished retracing all steps in supine  07/29/2023 Supine cording release/MFR in arm propped overhead on pillow position using opposing pressure, S pressure, and incorporating wave tool along cord with pt holding breast medially for opposing pull.  PROM into flexion and D2 MFR from axilla into lateral breast in a direction towards the belly button with pt reporting this reproduces some of her shoulder pain.  Then In supine: Short neck, superficial  and deep abdominals, bil axillary nodes and establishment of interaxillary pathway, L inguinal nodes and establishment of axilloinguinal pathway, then L breast moving fluid towards pathways spending extra time in any areas of fibrosis then retracing all steps and adding left UE proximal, then medial, and lateral directing toward pathways and both sides of forearm retracing all steps and ending with LN's     PATIENT EDUCATION:  Education details: compression bra and need for new ones, may wear at night to see if am pain is better, gave script Person educated: Patient Education method: Explanation Education comprehension: verbalized understanding  HOME EXERCISE PROGRAM:   ASSESSMENT:  CLINICAL IMPRESSION: Pt sees cardiologist Friday for recent scan.Today continued with manual therapy working to further reduce Lt breast lymphedema and symptoms of cording. Also trial of kinesiotape along Lt axillo-inguinal anastomosis.   OBJECTIVE IMPAIRMENTS: decreased knowledge of condition, increased edema, and impaired UE functional use.   ACTIVITY LIMITATIONS: sleeping, reach over head, and caring for others  PARTICIPATION LIMITATIONS:  exercising  PERSONAL FACTORS:  1-2 comorbidities: Breast and Left axillary surgery and recent abdominal surgery  are also affecting patient's functional outcome.  are also affecting  patient's functional outcome.   REHAB POTENTIAL: Excellent  CLINICAL DECISION MAKING: Stable/uncomplicated  EVALUATION COMPLEXITY: Low  GOALS: Goals reviewed with patient? Yes  SHORT TERM GOALS: Target date: 08/12/2023  Pt will report decreased breast pain and arm pain by 25% Baseline: Goal status: INITIAL  2.  Breast complaints questionnaire will improve to 40 to demonstrate improvement Baseline:  Goal status: INITIAL  3.  Pt will be independent with left breast MLD to decrease swelling Baseline:  Goal status: INITIAL   LONG TERM GOALS: Target date: 09/02/2023  Pt will  have decreased left breast and UE pain by greater than 50% Baseline:  Goal status: INITIAL  2.  Pt will have breast complaints questionnaire no greater than 25 to demonstrate improved swelling Baseline:  Goal status: INITIAL  3.  Pt will have appropriate compression garments for her arm and breast Baseline:  Goal status: INITIAL  4.  Pt will have left shoulder abd 165 for improved reaching Baseline:  Goal status: INITIAL  5.  Pt will have improved left breast swelling by 50% or greater Baseline:  Goal status: INITIAL   PLAN:  PT FREQUENCY: 2x/week  PT DURATION: 6 weeks  PLANNED INTERVENTIONS: 97164- PT Re-evaluation, 97110-Therapeutic exercises, 97530- Therapeutic activity, 97112- Neuromuscular re-education, 97535- Self Care, 16109- Manual therapy, and 97760- Orthotic Fit/training  PLAN FOR NEXT SESSION: MFR left axillary cording, Cont MLD to left breast and arm and start reviewing with pt. How was kinesiotaping?  Denyce Flank, PTA 08/17/2023, 1:34 PM

## 2023-08-19 ENCOUNTER — Ambulatory Visit

## 2023-08-19 DIAGNOSIS — M25612 Stiffness of left shoulder, not elsewhere classified: Secondary | ICD-10-CM

## 2023-08-19 DIAGNOSIS — I890 Lymphedema, not elsewhere classified: Secondary | ICD-10-CM

## 2023-08-19 DIAGNOSIS — Z483 Aftercare following surgery for neoplasm: Secondary | ICD-10-CM

## 2023-08-19 DIAGNOSIS — R600 Localized edema: Secondary | ICD-10-CM

## 2023-08-19 NOTE — Therapy (Addendum)
 OUTPATIENT PHYSICAL THERAPY  UPPER EXTREMITY ONCOLOGY TREATMENT  Patient Name: Sheena Jarvis MRN: [id number] DOB:22-Jul-1970, 53 y.o., female Today's Date: 08/19/2023  END OF SESSION:  PT End of Session - 08/19/23 1210     Visit Number 10    Number of Visits 12    Date for PT Re-Evaluation 09/02/23    PT Start Time 1205    PT Stop Time 1301    PT Time Calculation (min) 56 min    Activity Tolerance Patient tolerated treatment well    Behavior During Therapy WFL for tasks assessed/performed              Past Medical History:  Diagnosis Date   Pneumonia    PONV (postoperative nausea and vomiting)    Vision abnormalities    Past Surgical History:  Procedure Laterality Date   APPENDECTOMY     BREAST BIOPSY Left 01/12/2019   Pash   BREAST BIOPSY Left 12/21/2018   benign   BREAST CYST ASPIRATION Left    BREAST EXCISIONAL BIOPSY Left 06/22/2019   Axilla BX - Benign   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   HIP ARTHROSCOPY     MASS EXCISION Left 02/10/2019   Procedure: EXCISION OF LEFT BREAST MASS;  Surgeon: Lockie Rima, MD;  Location: [facility];  Service: General;  Laterality: Left;   SCAR REVISION Left 06/22/2019   Procedure: LEFT AXILLARY CONTRACTURE RELEASE;  Surgeon: Lockie Rima, MD;  Location: [facility];  Service: General;  Laterality: Left;   Patient Active Problem List   Diagnosis Date Noted   Numbness 02/04/2016   Cognitive changes 02/04/2016   Other fatigue 02/04/2016      REFERRING PROVIDER: Artemio Bilberry, MD  REFERRING DIAG: Lymphedema  THERAPY DIAG:  Lymphedema, not elsewhere classified  Aftercare following surgery for neoplasm  Stiffness of left shoulder, not elsewhere classified  Localized edema  ONSET DATE: Oct 2023 with recent exacerbation, 1 month ago(Jun 23, 2023)  Rationale for Evaluation and Treatment: Rehabilitation  SUBJECTIVE:                                                                                                                                                                                            SUBJECTIVE STATEMENT:  My breast is improving. The medial breast doesn't refill as quickly in the morning as it used to.    EVAL: I got back from [place]  and realized I had not worn my sleeve or compression bra on the plane. I didn't have any problems on my way there, but I wore my compression. Several days after getting home  I developed a lot of swelling in the left breast with pain at medial arm and sensation of cording in the axilla. Even my abdomen feels bad now and its not changing. I have been doing my Flexitouch and it feels better, but in the am when I wake up its bothering me again. I am wearing the [place] bra and a foam pad on the medial side. I wore the sleeve alittle, but I am so achy and heavy it makes it worse. Iron is low and I am really tired.   PERTINENT HISTORY:  Pt had recent surgery for Stage II Endometriosis on Oct 31,2024. She had a prior history of removal of benign breast mass on 02/10/2019 with complication of seroma and clogged drain and possible infection. She also developed lymphedema in her left breast and axillary cording. She receieved extensive PT with MLD, compression, manual therapy and dry needling, but had a significant persistent cord that was painful and limited shoulder ROM. She underwent an excision of that contracted scar tissue on 06/22/2019 that included one node, and develped a seroma after that.   PAIN:  Are you having pain? No, not currently   PRECAUTIONS: Left UE lymphedema  RED FLAGS: None   WEIGHT BEARING RESTRICTIONS: No  FALLS:  Has patient fallen in last 6 months? 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  COGNITION: Overall cognitive status: Within functional limits for tasks assessed   PALPATION: Tenderness in axillary area of cording with tightness noted to run along the biceps  OBSERVATIONS / OTHER ASSESSMENTS: significant generalized left breast swelling and lateral trunk swelling, minimally enlarged pores, some medial fibrosis  SENSATION: Light touch:     POSTURE: WNL  UPPER EXTREMITY AROM/PROM:  A/PROM RIGHT   eval   Shoulder extension 63  Shoulder flexion 155  Shoulder abduction 177  Shoulder internal rotation   Shoulder external rotation     (Blank rows = not tested)  A/PROM LEFT   eval  Shoulder extension 50  Shoulder flexion 150, cord, impingement  Shoulder abduction 149  Shoulder internal rotation   Shoulder external rotation     (Blank rows = not tested)  CERVICAL AROM: All within normal limits:   UPPER EXTREMITY STRENGTH:   LYMPHEDEMA ASSESSMENTS:   SURGERY TYPE/DATE: 02/10/2019 Removal of Benign Breast Mass, 06/22/2019 Excision of contracted scar , Endometriosis/hysterectomy surgery 03/04/2022   NUMBER OF LYMPH NODES REMOVED: 1 left axillary with excision of contracted scar   CHEMOTHERAPY: NO  RADIATION:NO  HORMONE TREATMENT: NO  INFECTIONS: [id number] seromas after surgery   LYMPHEDEMA ASSESSMENTS:   LANDMARK RIGHT  eval  At axilla  24.9  15 cm proximal to olecranon process   10 cm proximal to olecranon process 23.6  Olecranon process 21.6  15 cm proximal to ulnar styloid process   10 cm proximal to ulnar styloid process 19.2  Just proximal to ulnar styloid process 14.45  Across hand at thumb web space 18  At base of 2nd digit 5.7  (Blank rows = not tested)  LANDMARK LEFT  eval  At axilla  25  15 cm proximal to olecranon process   10 cm  proximal  to olecranon process 22.6  Olecranon process 21.3  15 cm proximal to ulnar styloid process   10 cm proximal to ulnar styloid process 18.3  Just proximal to ulnar styloid process 14.4  Across hand at thumb web space 17.8  At base of 2nd digit 5.85  (Blank rows = not tested)   FUNCTIONAL TESTS:    GAIT: WNL      BREAST COMPLAINTS QUESTIONNAIRE Pain:8 Heaviness:10 Swollen feeling:10 Tense Skin:8 Redness:0 Bra Print:10 Size of Pores:1 Hard feeling: 5 Total:   52  /80 A Score over 9 indicates lymphedema issues in the breast                                                                                       TREATMENT DATE:  08/19/23: Manual Therapy MFR in supine for cording release in from breast to axilla into upper arm following cord with arm OH at end range flex and abd using opposing pressure and prolonged holds along breast cord that extends into axilla and medial upper arm, with pt holding breast medially for opposing pull; then into Rt S/L for continued MFR to cording with arm in end ROM flex and resting on therapists shoulder.  MLD In supine: Short neck, superficial and deep abdominals, Rt and left axillary LN's and right pectoral nodes, intact anterior thorax sequence and establishment of anterior inter-axillary pathway, Lt inguinal nodes and establishment of Lt axillo-inguinal pathway, then L breast moving fluid towards pathways spending extra time in any areas of fibrosis then retracing all steps, then finished retracing all steps in supine. Kinesiotape: Removed tape from last session and reapplied same but slightly posterior to decrease irritation at skin.   08/17/23: Manual Therapy MFR in supine for cording release in from breast to axilla into upper arm following cord with arm propped overhead on pillow position using opposing pressure, "S" technique  and prolonged holds along breast cord that extends into axilla and medial upper arm, with pt holding breast  medially for opposing pull.  MLD In supine: Short neck, superficial and deep abdominals, Rt and left axillary LN's and right pectoral nodes, intact anterior thorax sequence and establishment of anterior inter-axillary pathway, Lt inguinal nodes and establishment of Lt axillo-inguinal pathway, then L breast moving fluid towards pathways spending extra time in any areas of fibrosis then retracing all steps, then finished retracing all steps in supine. Bilateral breasts observed post rx; left breast swelling improved but still considerably swollen. Discussed wearing compression bra at night if she can be comfortable. Trial of kinesiotape along Lt axillo-inguinal anastomosis: bucket placed inferior to transverse watershed and 2 fingers running along lateral trunk to lateral breast. Pt is familiar with kinesiotape so knows to remove if noting skin irritation or reaction.   08/11/2023 MFR in supine for cording release in from breast to axilla into upper arm following cord with arm propped overhead on pillow position using opposing pressure, "S" technique  and prolonged holds along breast cord that extends into axilla and medial upper arm MLD In supine: Short neck, superficial and deep abdominals, Rt and left axillary LN's and right pectoral nodes, intact anterior thorax sequence and establishment of  anterior inter-axillary pathway, Lt inguinal nodes and establishment of Lt axillo-inguinal pathway, then L breast moving fluid towards pathways spending extra time in any areas of fibrosis then retracing all steps, then finished retracing all steps in supine    08/10/23: Manual Therapy MFR in supine for cording release in from breast to axilla into upper arm following cord with arm propped overhead on pillow position using opposing pressure, "S" technique  and prolonged holds along breast cord that extends into axilla and medial upper arm, with pt holding breast medially for opposing pull.  MLD In supine: Short neck,  superficial and deep abdominals, Rt and left axillary LN's and right pectoral nodes, intact anterior thorax sequence and establishment of anterior inter-axillary pathway, Lt inguinal nodes and establishment of Lt axillo-inguinal pathway, then L breast moving fluid towards pathways spending extra time in any areas of fibrosis then retracing all steps, then finished retracing all steps in supine. Bilateral breasts observed post rx; left breast swelling improved but still considerably swollen. Discussed wearing compression bra at night if she can be comfortable.       PATIENT EDUCATION:  Education details: compression bra and need for new ones, may wear at night to see if am pain is better, gave script Person educated: Patient Education method: Explanation Education comprehension: verbalized understanding  HOME EXERCISE PROGRAM:   ASSESSMENT:  CLINICAL IMPRESSION: Pt sees cardiologist Friday for results from her scan last week. Today continued with MLD to LT breast and MFR to cording. Also continued with kinesiotape as overall pt is feeling better. So removed last tape and reapplied slightly posterior to last tape location, but still along lateral anastomosis.   OBJECTIVE IMPAIRMENTS: decreased knowledge of condition, increased edema, and impaired UE functional use.   ACTIVITY LIMITATIONS: sleeping, reach over head, and caring for others  PARTICIPATION LIMITATIONS:  exercising  PERSONAL FACTORS:  1-2 comorbidities: Breast and Left axillary surgery and recent abdominal surgery  are also affecting patient's functional outcome.  are also affecting patient's functional outcome.   REHAB POTENTIAL: Excellent  CLINICAL DECISION MAKING: Stable/uncomplicated  EVALUATION COMPLEXITY: Low  GOALS: Goals reviewed with patient? Yes  SHORT TERM GOALS: Target date: 08/12/2023  Pt will report decreased breast pain and arm pain by 25% Baseline: Goal status: INITIAL  2.  Breast complaints  questionnaire will improve to 40 to demonstrate improvement Baseline:  Goal status: INITIAL  3.  Pt will be independent with left breast MLD to decrease swelling Baseline:  Goal status: INITIAL   LONG TERM GOALS: Target date: 09/02/2023  Pt will have decreased left breast and UE pain by greater than 50% Baseline:  Goal status: INITIAL  2.  Pt will have breast complaints questionnaire no greater than 25 to demonstrate improved swelling Baseline:  Goal status: INITIAL  3.  Pt will have appropriate compression garments for her arm and breast Baseline:  Goal status: INITIAL  4.  Pt will have left shoulder abd 165 for improved reaching Baseline:  Goal status: INITIAL  5.  Pt will have improved left breast swelling by 50% or greater Baseline:  Goal status: INITIAL   PLAN:  PT FREQUENCY: 2x/week  PT DURATION: 6 weeks  PLANNED INTERVENTIONS: 97164- PT Re-evaluation, 97110-Therapeutic exercises, 97530- Therapeutic activity, 97112- Neuromuscular re-education, 97535- Self Care, 11914- Manual therapy, and 97760- Orthotic Fit/training  PLAN FOR NEXT SESSION: MFR left axillary cording in varying positions, Cont MLD to left breast and arm and start reviewing with pt; cont kinesiotape if pt conts to feel beneficial.  Denyce Flank, PTA 08/19/2023, 1:08 PM

## 2023-08-24 ENCOUNTER — Ambulatory Visit: Admitting: Rehabilitation

## 2023-08-24 ENCOUNTER — Encounter: Payer: Self-pay | Admitting: Rehabilitation

## 2023-08-24 DIAGNOSIS — M25612 Stiffness of left shoulder, not elsewhere classified: Secondary | ICD-10-CM

## 2023-08-24 DIAGNOSIS — Z483 Aftercare following surgery for neoplasm: Secondary | ICD-10-CM

## 2023-08-24 DIAGNOSIS — R600 Localized edema: Secondary | ICD-10-CM

## 2023-08-24 DIAGNOSIS — I890 Lymphedema, not elsewhere classified: Secondary | ICD-10-CM | POA: Diagnosis not present

## 2023-08-24 NOTE — Therapy (Addendum)
 OUTPATIENT PHYSICAL THERAPY  UPPER EXTREMITY ONCOLOGY TREATMENT  Patient Name: Sheena Jarvis MRN: [id number] DOB:[birth year], 53 y.o., female Today's Date: 08/24/2023  END OF SESSION:  PT End of Session - 08/24/23 0857     Visit Number 11    Number of Visits 12    Date for PT Re-Evaluation 09/02/23    PT Start Time 0800    PT Stop Time 0856    PT Time Calculation (min) 56 min    Activity Tolerance Patient tolerated treatment well    Behavior During Therapy [facility] for tasks assessed/performed               Past Medical History:  Diagnosis Date   Pneumonia    PONV (postoperative nausea and vomiting)    Vision abnormalities    Past Surgical History:  Procedure Laterality Date   APPENDECTOMY     BREAST BIOPSY Left 01/12/2019   Pash   BREAST BIOPSY Left 12/21/2018   benign   BREAST CYST ASPIRATION Left    BREAST EXCISIONAL BIOPSY Left 06/22/2019   Axilla BX - Benign   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   HIP ARTHROSCOPY     MASS EXCISION Left 02/10/2019   Procedure: EXCISION OF LEFT BREAST MASS;  Surgeon: Lockie Rima, MD;  Location: [facility];  Service: General;  Laterality: Left;   SCAR REVISION Left 06/22/2019   Procedure: LEFT AXILLARY CONTRACTURE RELEASE;  Surgeon: Lockie Rima, MD;  Location: [facility];  Service: General;  Laterality: Left;   Patient Active Problem List   Diagnosis Date Noted   Numbness 02/04/2016   Cognitive changes 02/04/2016   Other fatigue 02/04/2016      REFERRING PROVIDER: Artemio Bilberry, MD  REFERRING DIAG: Lymphedema  THERAPY DIAG:  Lymphedema, not elsewhere classified  Aftercare following surgery for neoplasm  Stiffness of left shoulder, not elsewhere classified  Localized edema  ONSET DATE: Oct 2023 with recent exacerbation, 1 month ago(Jun 23, 2023)  Rationale for Evaluation and Treatment: Rehabilitation  SUBJECTIVE:                                                                                                                                                                                            SUBJECTIVE STATEMENT:  I may want to do 1x per week for a few weeks as it is much better but not gone.    EVAL: I got back from [place]  and realized I had not worn my sleeve or compression bra on the plane. I didn't have any problems on my way there, but I wore my compression. Several days  after getting home I developed a lot of swelling in the left breast with pain at medial arm and sensation of cording in the axilla. Even my abdomen feels bad now and its not changing. I have been doing my Flexitouch and it feels better, but in the am when I wake up its bothering me again. I am wearing the [place] bra and a foam pad on the medial side. I wore the sleeve alittle, but I am so achy and heavy it makes it worse. Iron is low and I am really tired.   PERTINENT HISTORY:  Pt had recent surgery for Stage II Endometriosis on Oct 31,2024. She had a prior history of removal of benign breast mass on 02/10/2019 with complication of seroma and clogged drain and possible infection. She also developed lymphedema in her left breast and axillary cording. She receieved extensive PT with MLD, compression, manual therapy and dry needling, but had a significant persistent cord that was painful and limited shoulder ROM. She underwent an excision of that contracted scar tissue on 06/22/2019 that included one node, and develped a seroma after that.   PAIN:  Are you having pain? No, not currently   PRECAUTIONS: Left UE lymphedema  RED FLAGS: None   WEIGHT BEARING RESTRICTIONS: No  FALLS:  Has patient fallen in last 6 months? 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  COGNITION: Overall cognitive status: Within functional limits for tasks assessed   PALPATION: Tenderness in axillary area of cording with tightness noted to run along the biceps  OBSERVATIONS / OTHER ASSESSMENTS: significant generalized left breast swelling and lateral trunk swelling, minimally enlarged pores, some medial fibrosis  SENSATION: Light touch:     POSTURE: WNL  UPPER EXTREMITY AROM/PROM:  A/PROM RIGHT   eval   Shoulder extension 63  Shoulder flexion 155  Shoulder abduction 177  Shoulder internal rotation   Shoulder external rotation     (Blank rows = not tested)  A/PROM LEFT   eval  Shoulder extension 50  Shoulder flexion 150, cord, impingement  Shoulder abduction 149  Shoulder internal rotation   Shoulder external rotation     (Blank rows = not tested)  CERVICAL AROM: All within normal limits:   UPPER EXTREMITY STRENGTH:   LYMPHEDEMA ASSESSMENTS:   SURGERY TYPE/DATE: 02/10/2019 Removal of Benign Breast Mass, 06/22/2019 Excision of contracted scar , Endometriosis/hysterectomy surgery 03/04/2022   NUMBER OF LYMPH NODES REMOVED: 1 left axillary with excision of contracted scar   CHEMOTHERAPY: NO  RADIATION:NO  HORMONE TREATMENT: NO  INFECTIONS: AOZ:HYQMVHQI seromas after surgery   LYMPHEDEMA ASSESSMENTS:   LANDMARK RIGHT  eval  At axilla  24.9  15 cm proximal to olecranon process   10 cm proximal to olecranon process 23.6  Olecranon process 21.6  15 cm proximal to ulnar styloid process   10 cm proximal to ulnar styloid process 19.2  Just proximal to ulnar styloid process 14.45  Across hand at thumb web space 18  At base of 2nd digit 5.7  (Blank rows = not tested)  LANDMARK LEFT  eval  At axilla  25  15 cm proximal to olecranon process   10  cm proximal to  olecranon process 22.6  Olecranon process 21.3  15 cm proximal to ulnar styloid process   10 cm proximal to ulnar styloid process 18.3  Just proximal to ulnar styloid process 14.4  Across hand at thumb web space 17.8  At base of 2nd digit 5.85  (Blank rows = not tested)   FUNCTIONAL TESTS:    GAIT: WNL      BREAST COMPLAINTS QUESTIONNAIRE Pain:8 Heaviness:10 Swollen feeling:10 Tense Skin:8 Redness:0 Bra Print:10 Size of Pores:1 Hard feeling: 5 Total:   52  /80 A Score over 9 indicates lymphedema issues in the breast                                                                                       TREATMENT DATE:  08/24/23: Manual Therapy MFR in supine for cording release in from breast to axilla into upper arm following cord with arm OH at end range flex and abd using opposing pressure and prolonged holds along breast cord that extends into axilla and medial upper arm, with pt holding breast medially for opposing pull; then into Rt S/L for continued MFR to cording with arm in end ROM flex and resting on therapists shoulder.  MLD In supine: Short neck, superficial and deep abdominals, Rt and left axillary LN's and right pectoral nodes, intact anterior thorax sequence and establishment of anterior inter-axillary pathway, Lt inguinal nodes and establishment of Lt axillo-inguinal pathway, then L breast moving fluid towards pathways spending extra time in any areas of fibrosis then retracing all steps, then finished retracing all steps in supine.  08/19/23: Manual Therapy MFR in supine for cording release in from breast to axilla into upper arm following cord with arm OH at end range flex and abd using opposing pressure and prolonged holds along breast cord that extends into axilla and medial upper arm, with pt holding breast medially for opposing pull; then into Rt S/L for continued MFR to cording with arm in end ROM flex and resting on therapists shoulder.  MLD In supine: Short  neck, superficial and deep abdominals, Rt and left axillary LN's and right pectoral nodes, intact anterior thorax sequence and establishment of anterior inter-axillary pathway, Lt inguinal nodes and establishment of Lt axillo-inguinal pathway, then L breast moving fluid towards pathways spending extra time in any areas of fibrosis then retracing all steps, then finished retracing all steps in supine. Kinesiotape: Removed tape from last session and reapplied same but slightly posterior to decrease irritation at skin.   08/17/23: Manual Therapy MFR in supine for cording release in from breast to axilla into upper arm following cord with arm propped overhead on pillow position using opposing pressure, "S" technique  and prolonged holds along breast cord that extends into axilla and medial upper arm, with pt holding breast medially for opposing pull.  MLD In supine: Short neck, superficial and deep abdominals, Rt and left axillary LN's and right pectoral nodes, intact anterior thorax sequence and establishment of anterior inter-axillary pathway, Lt inguinal nodes and establishment of Lt axillo-inguinal pathway, then L breast moving fluid towards pathways spending extra time in any areas of fibrosis then retracing all  steps, then finished retracing all steps in supine. Bilateral breasts observed post rx; left breast swelling improved but still considerably swollen. Discussed wearing compression bra at night if she can be comfortable. Trial of kinesiotape along Lt axillo-inguinal anastomosis: bucket placed inferior to transverse watershed and 2 fingers running along lateral trunk to lateral breast. Pt is familiar with kinesiotape so knows to remove if noting skin irritation or reaction.   08/11/2023 MFR in supine for cording release in from breast to axilla into upper arm following cord with arm propped overhead on pillow position using opposing pressure, "S" technique  and prolonged holds along breast cord that  extends into axilla and medial upper arm MLD In supine: Short neck, superficial and deep abdominals, Rt and left axillary LN's and right pectoral nodes, intact anterior thorax sequence and establishment of anterior inter-axillary pathway, Lt inguinal nodes and establishment of Lt axillo-inguinal pathway, then L breast moving fluid towards pathways spending extra time in any areas of fibrosis then retracing all steps, then finished retracing all steps in supine.   08/10/23: Manual Therapy MFR in supine for cording release in from breast to axilla into upper arm following cord with arm propped overhead on pillow position using opposing pressure, "S" technique  and prolonged holds along breast cord that extends into axilla and medial upper arm, with pt holding breast medially for opposing pull.  MLD In supine: Short neck, superficial and deep abdominals, Rt and left axillary LN's and right pectoral nodes, intact anterior thorax sequence and establishment of anterior inter-axillary pathway, Lt inguinal nodes and establishment of Lt axillo-inguinal pathway, then L breast moving fluid towards pathways spending extra time in any areas of fibrosis then retracing all steps, then finished retracing all steps in supine. Bilateral breasts observed post rx; left breast swelling improved but still considerably swollen. Discussed wearing compression bra at night if she can be comfortable.       PATIENT EDUCATION:  Education details: compression bra and need for new ones, may wear at night to see if am pain is better, gave script Person educated: Patient Education method: Explanation Education comprehension: verbalized understanding  HOME EXERCISE PROGRAM:   ASSESSMENT:  CLINICAL IMPRESSION: Continued with MLD to LT breast and MFR to cording. Needs recert for extending POC  OBJECTIVE IMPAIRMENTS: decreased knowledge of condition, increased edema, and impaired UE functional use.   ACTIVITY LIMITATIONS:  sleeping, reach over head, and caring for others  PARTICIPATION LIMITATIONS:  exercising  PERSONAL FACTORS:  1-2 comorbidities: Breast and Left axillary surgery and recent abdominal surgery  are also affecting patient's functional outcome.  are also affecting patient's functional outcome.   REHAB POTENTIAL: Excellent  CLINICAL DECISION MAKING: Stable/uncomplicated  EVALUATION COMPLEXITY: Low  GOALS: Goals reviewed with patient? Yes  SHORT TERM GOALS: Target date: 08/12/2023  Pt will report decreased breast pain and arm pain by 25% Baseline: Goal status: INITIAL  2.  Breast complaints questionnaire will improve to 40 to demonstrate improvement Baseline:  Goal status: INITIAL  3.  Pt will be independent with left breast MLD to decrease swelling Baseline:  Goal status: INITIAL   LONG TERM GOALS: Target date: 09/02/2023  Pt will have decreased left breast and UE pain by greater than 50% Baseline:  Goal status: INITIAL  2.  Pt will have breast complaints questionnaire no greater than 25 to demonstrate improved swelling Baseline:  Goal status: INITIAL  3.  Pt will have appropriate compression garments for her arm and breast Baseline:  Goal status: INITIAL  4.  Pt will have left shoulder abd 165 for improved reaching Baseline:  Goal status: INITIAL  5.  Pt will have improved left breast swelling by 50% or greater Baseline:  Goal status: INITIAL   PLAN:  PT FREQUENCY: 2x/week  PT DURATION: 6 weeks  PLANNED INTERVENTIONS: 97164- PT Re-evaluation, 97110-Therapeutic exercises, 97530- Therapeutic activity, 97112- Neuromuscular re-education, 97535- Self Care, 16109- Manual therapy, and 97760- Orthotic Fit/training  PLAN FOR NEXT SESSION: MFR left axillary cording in varying positions, Cont MLD to left breast and arm and start reviewing with pt; cont kinesiotape if pt conts to feel beneficial.  Marizol Borror R, PT 08/24/2023, 8:57 AM

## 2023-08-25 ENCOUNTER — Ambulatory Visit

## 2023-08-25 DIAGNOSIS — M25612 Stiffness of left shoulder, not elsewhere classified: Secondary | ICD-10-CM

## 2023-08-25 DIAGNOSIS — R600 Localized edema: Secondary | ICD-10-CM

## 2023-08-25 DIAGNOSIS — I890 Lymphedema, not elsewhere classified: Secondary | ICD-10-CM | POA: Diagnosis not present

## 2023-08-25 DIAGNOSIS — Z483 Aftercare following surgery for neoplasm: Secondary | ICD-10-CM

## 2023-08-25 NOTE — Therapy (Addendum)
 OUTPATIENT PHYSICAL THERAPY  UPPER EXTREMITY ONCOLOGY TREATMENT  Patient Name: Sheena Jarvis MRN: [id number] DOB:Feb 01, 1971, 53 y.o., female Today's Date: 08/25/2023  END OF SESSION:  PT End of Session - 08/25/23 0801     Visit Number 12    Number of Visits 21    Date for PT Re-Evaluation 10/06/23    Authorization Type [employer]    Authorization Time Period none needed    PT Start Time 0803    PT Stop Time 0859    PT Time Calculation (min) 56 min    Activity Tolerance Patient tolerated treatment well    Behavior During Therapy [facility] for tasks assessed/performed               Past Medical History:  Diagnosis Date   Pneumonia    PONV (postoperative nausea and vomiting)    Vision abnormalities    Past Surgical History:  Procedure Laterality Date   APPENDECTOMY     BREAST BIOPSY Left 01/12/2019   Pash   BREAST BIOPSY Left 12/21/2018   benign   BREAST CYST ASPIRATION Left    BREAST EXCISIONAL BIOPSY Left 06/22/2019   Axilla BX - Benign   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   HIP ARTHROSCOPY     MASS EXCISION Left 02/10/2019   Procedure: EXCISION OF LEFT BREAST MASS;  Surgeon: Lockie Rima, MD;  Location: [facility];  Service: General;  Laterality: Left;   SCAR REVISION Left 06/22/2019   Procedure: LEFT AXILLARY CONTRACTURE RELEASE;  Surgeon: Lockie Rima, MD;  Location: [facility];  Service: General;  Laterality: Left;   Patient Active Problem List   Diagnosis Date Noted   Numbness 02/04/2016   Cognitive changes 02/04/2016   Other fatigue 02/04/2016      REFERRING PROVIDER: Artemio Bilberry, MD  REFERRING DIAG: Lymphedema  THERAPY DIAG:  Lymphedema, not elsewhere classified  Aftercare following surgery for neoplasm  Stiffness of left shoulder, not elsewhere classified  Localized edema  ONSET DATE: Oct 2023 with recent exacerbation, 1 month ago(Jun 23, 2023)  Rationale for Evaluation and Treatment:  Rehabilitation  SUBJECTIVE:                                                                                                                                                                                           SUBJECTIVE STATEMENT:   My breast swelling is better by about 50%- 70% but it is still there. It still feels tight and I get a mark from my bra, and I get nerve pain that feels like it may be from the cording... No longer having night  pain and I rest better at night. Performing MLD at home and using pump. They are sending me new garments. I would like to order some new sleeves.   EVAL: I got back from [place]  and realized I had not worn my sleeve or compression bra on the plane. I didn't have any problems on my way there, but I wore my compression. Several days after getting home I developed a lot of swelling in the left breast with pain at medial arm and sensation of cording in the axilla. Even my abdomen feels bad now and its not changing. I have been doing my Flexitouch and it feels better, but in the am when I wake up its bothering me again. I am wearing the [place] bra and a foam pad on the medial side. I wore the sleeve alittle, but I am so achy and heavy it makes it worse. Iron is low and I am really tired.   PERTINENT HISTORY:  Pt had recent surgery for Stage II Endometriosis on Oct 31,2024. She had a prior history of removal of benign breast mass on 02/10/2019 with complication of seroma and clogged drain and possible infection. She also developed lymphedema in her left breast and axillary cording. She receieved extensive PT with MLD, compression, manual therapy and dry needling, but had a significant persistent cord that was painful and limited shoulder ROM. She underwent an excision of that contracted scar tissue on 06/22/2019 that included one node, and develped a seroma after that.   PAIN:  Are you having pain? No, not currently   PRECAUTIONS: Left UE lymphedema  RED  FLAGS: None   WEIGHT BEARING RESTRICTIONS: No  FALLS:  Has patient fallen in last 6 months? 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  COGNITION: Overall cognitive status: Within functional limits for tasks assessed   PALPATION: Tenderness in axillary area of cording with tightness noted to run along the biceps  OBSERVATIONS / OTHER ASSESSMENTS: significant generalized left breast swelling and lateral trunk swelling, minimally enlarged pores, some medial fibrosis  SENSATION: Light touch:     POSTURE: WNL  UPPER EXTREMITY AROM/PROM:  A/PROM RIGHT   eval   Shoulder extension 63  Shoulder flexion 155  Shoulder abduction 177  Shoulder internal rotation   Shoulder external rotation     (Blank rows = not tested)  A/PROM LEFT   eval LEFT 08/25/2023  Shoulder extension 50   Shoulder flexion 150, cord, impingement 163  Shoulder abduction 149 154  Shoulder internal rotation    Shoulder external rotation      (Blank rows = not tested)  CERVICAL AROM: All within normal limits:   UPPER EXTREMITY STRENGTH:   LYMPHEDEMA ASSESSMENTS:   SURGERY TYPE/DATE: 02/10/2019 Removal of Benign Breast Mass, 06/22/2019 Excision of contracted scar , Endometriosis/hysterectomy surgery 03/04/2022   NUMBER OF LYMPH NODES REMOVED: 1 left axillary with excision of contracted scar   CHEMOTHERAPY: NO  RADIATION:NO  HORMONE TREATMENT: NO  INFECTIONS: [id number] seromas after surgery   LYMPHEDEMA ASSESSMENTS:   LANDMARK RIGHT  eval  At axilla  24.9  15 cm proximal to olecranon process   10 cm  proximal to  olecranon process 23.6  Olecranon process 21.6  15 cm proximal to ulnar styloid process   10 cm proximal to ulnar styloid process 19.2  Just proximal to ulnar styloid process 14.45  Across hand at thumb web space 18  At base of 2nd digit 5.7  (Blank rows = not tested)  LANDMARK LEFT  eval  At axilla  25  15 cm proximal to olecranon process   10 cm proximal to olecranon process 22.6  Olecranon process 21.3  15 cm proximal to ulnar styloid process   10 cm proximal to ulnar styloid process 18.3  Just proximal to ulnar styloid process 14.4  Across hand at thumb web space 17.8  At base of 2nd digit 5.85  (Blank rows = not tested)   FUNCTIONAL TESTS:    GAIT: WNL      BREAST COMPLAINTS QUESTIONNAIRE (EVAL) Pain:8 Heaviness:10 Swollen feeling:10 Tense Skin:8 Redness:0 Bra Print:10 Size of Pores:1 Hard feeling: 5 Total:   52  /80 A Score over 9 indicates lymphedema issues in the breast BREAST COMPLAINTS QUESTIONNAIRE Pain:6 Heaviness:5 Swollen feeling:4 Tense Skin:2 Redness:0 Bra Print:9 Size of Pores:0 Hard feeling: 6 Total:    32 /80 A Score over 9 indicates lymphedema issues in the breast                                                                                       TREATMENT DATE:  08/25/2023 Discussed progress and goals MFR in supine for cording release in from breast to axilla into upper arm following cord with arm OH at end range flex and abd using opposing pressure, S, and prolonged holds along breast cord that extends into axilla and medial upper arm, with pt holding breast medially for opposing pull;  MLD In supine: Short neck, superficial and deep abdominals, Rt and left axillary LN's and right pectoral nodes, intact anterior thorax sequence and establishment of anterior inter-axillary pathway, Lt inguinal nodes and establishment of Lt axillo-inguinal pathway, then L breast moving fluid towards pathways spending extra time in any areas of  fibrosis then retracing all steps, then finished retracing all steps in supine. Discussed compression garments; pt requires a script from MD for Juzo soft size 1 class 1 Bird Henna; beige sleeve Script faxed to Dr. Bryson Carbine today  08/24/23: Manual Therapy MFR in supine for cording release in from breast to axilla into upper arm following cord with arm OH at end range flex and abd using opposing pressure and prolonged holds along breast cord that extends into axilla and medial upper arm, with pt holding breast medially for opposing pull; then into Rt S/L for continued MFR to cording with arm in end ROM flex and resting on therapists shoulder.  MLD In supine: Short neck, superficial and deep abdominals, Rt and left axillary LN's and right pectoral nodes, intact anterior thorax sequence and establishment of anterior inter-axillary pathway, Lt inguinal nodes and establishment of Lt axillo-inguinal pathway, then L breast moving fluid towards pathways spending extra time in any areas of fibrosis then retracing all steps, then finished retracing all steps in supine.  08/19/23: Manual Therapy MFR in supine for cording release in from  breast to axilla into upper arm following cord with arm OH at end range flex and abd using opposing pressure and prolonged holds along breast cord that extends into axilla and medial upper arm, with pt holding breast medially for opposing pull; then into Rt S/L for continued MFR to cording with arm in end ROM flex and resting on therapists shoulder.  MLD In supine: Short neck, superficial and deep abdominals, Rt and left axillary LN's and right pectoral nodes, intact anterior thorax sequence and establishment of anterior inter-axillary pathway, Lt inguinal nodes and establishment of Lt axillo-inguinal pathway, then L breast moving fluid towards pathways spending extra time in any areas of fibrosis then retracing all steps, then finished retracing all steps in supine. Kinesiotape:  Removed tape from last session and reapplied same but slightly posterior to decrease irritation at skin.   08/17/23: Manual Therapy MFR in supine for cording release in from breast to axilla into upper arm following cord with arm propped overhead on pillow position using opposing pressure, "S" technique  and prolonged holds along breast cord that extends into axilla and medial upper arm, with pt holding breast medially for opposing pull.  MLD In supine: Short neck, superficial and deep abdominals, Rt and left axillary LN's and right pectoral nodes, intact anterior thorax sequence and establishment of anterior inter-axillary pathway, Lt inguinal nodes and establishment of Lt axillo-inguinal pathway, then L breast moving fluid towards pathways spending extra time in any areas of fibrosis then retracing all steps, then finished retracing all steps in supine. Bilateral breasts observed post rx; left breast swelling improved but still considerably swollen. Discussed wearing compression bra at night if she can be comfortable. Trial of kinesiotape along Lt axillo-inguinal anastomosis: bucket placed inferior to transverse watershed and 2 fingers running along lateral trunk to lateral breast. Pt is familiar with kinesiotape so knows to remove if noting skin irritation or reaction.   08/11/2023 MFR in supine for cording release in from breast to axilla into upper arm following cord with arm propped overhead on pillow position using opposing pressure, "S" technique  and prolonged holds along breast cord that extends into axilla and medial upper arm MLD In supine: Short neck, superficial and deep abdominals, Rt and left axillary LN's and right pectoral nodes, intact anterior thorax sequence and establishment of anterior inter-axillary pathway, Lt inguinal nodes and establishment of Lt axillo-inguinal pathway, then L breast moving fluid towards pathways spending extra time in any areas of fibrosis then retracing all  steps, then finished retracing all steps in supine.   08/10/23: Manual Therapy MFR in supine for cording release in from breast to axilla into upper arm following cord with arm propped overhead on pillow position using opposing pressure, "S" technique  and prolonged holds along breast cord that extends into axilla and medial upper arm, with pt holding breast medially for opposing pull.  MLD In supine: Short neck, superficial and deep abdominals, Rt and left axillary LN's and right pectoral nodes, intact anterior thorax sequence and establishment of anterior inter-axillary pathway, Lt inguinal nodes and establishment of Lt axillo-inguinal pathway, then L breast moving fluid towards pathways spending extra time in any areas of fibrosis then retracing all steps, then finished retracing all steps in supine. Bilateral breasts observed post rx; left breast swelling improved but still considerably swollen. Discussed wearing compression bra at night if she can be comfortable.       PATIENT EDUCATION:  Education details: compression bra and need for new ones, may wear  at night to see if am pain is better, gave script Person educated: Patient Education method: Explanation Education comprehension: verbalized understanding  HOME EXERCISE PROGRAM:   ASSESSMENT:  CLINICAL IMPRESSION: Pt is making good overall progress. She has achieved all short term goals and is progressing with LTG's.Breast swelling has reduced by 50-70% overall, however she continues with some firm areas and generalized swelling which still causes bra imprint. Axillary cording  is tied to breast pain and although improved still causes some limitation in AROM for abduction, and occasional sharp pain. Pt is compliant with her compression pump and is getting new garments for pump due to velcro wear..Pt requires new Left UE compression garments for treatment of lymphedema: QTY (3) Juzo soft size 1 class 1 Bird Henna; beige sleeves. Script  received from MD .  She will continue to benefit from skilled PT to address remaining breast swelling and cording. Will decrease visits to 1x/week unless she experiences a flare up may increase to 2x/ week prn  OBJECTIVE IMPAIRMENTS: decreased knowledge of condition, increased edema, and impaired UE functional use.   ACTIVITY LIMITATIONS: sleeping, reach over head, and caring for others  PARTICIPATION LIMITATIONS:  exercising  PERSONAL FACTORS:  1-2 comorbidities: Breast and Left axillary surgery and recent abdominal surgery  are also affecting patient's functional outcome.  are also affecting patient's functional outcome.   REHAB POTENTIAL: Excellent  CLINICAL DECISION MAKING: Stable/uncomplicated  EVALUATION COMPLEXITY: Low  GOALS: Goals reviewed with patient? Yes  SHORT TERM GOALS: Target date: 08/12/2023  Pt will report decreased breast pain and arm pain by 25% Baseline: Goal status: MET 08/25/2023 2.  Breast complaints questionnaire will improve to 40 to demonstrate improvement Baseline:  Goal status: MET 08/25/2023 (32) 3.  Pt will be independent with left breast MLD to decrease swelling Baseline:  Goal status: MET 08/25/2023   LONG TERM GOALS: Target date: 09/02/2023  Pt will have decreased left breast and UE pain by greater than 50% Baseline:  Goal status: MET 08/25/2023 2.  Pt will have breast complaints questionnaire no greater than 25 to demonstrate improved swelling Baseline:  Goal status: In Progress 3.  Pt will have appropriate compression garments for her arm and breast Baseline:  Goal status: MET 08/25/2023  4.  Pt will have left shoulder abd 165 for improved reaching Baseline:  Goal status: In Progress 5.  Pt will have improved left breast swelling by 50% or greater Baseline:  Goal status: MET 08/25/2023   PLAN:  PT FREQUENCY:1- 2x/week or 9 visits prn  PT DURATION: 6 weeks  PLANNED INTERVENTIONS: 97164- PT Re-evaluation, 97110-Therapeutic exercises,  97530- Therapeutic activity, 97112- Neuromuscular re-education, 97535- Self Care, 78469- Manual therapy, and 97760- Orthotic Fit/training  PLAN FOR NEXT SESSION: MFR left axillary cording in varying positions, Cont MLD to left breast and arm and start continue review  with pt; cont kinesiotape if pt conts to feel beneficial.  Latisha Poland, PT 08/25/2023, 9:12 AM

## 2023-08-31 ENCOUNTER — Ambulatory Visit

## 2023-08-31 DIAGNOSIS — I890 Lymphedema, not elsewhere classified: Secondary | ICD-10-CM | POA: Diagnosis not present

## 2023-08-31 DIAGNOSIS — M25612 Stiffness of left shoulder, not elsewhere classified: Secondary | ICD-10-CM

## 2023-08-31 DIAGNOSIS — R600 Localized edema: Secondary | ICD-10-CM

## 2023-08-31 DIAGNOSIS — Z483 Aftercare following surgery for neoplasm: Secondary | ICD-10-CM

## 2023-08-31 NOTE — Therapy (Signed)
 OUTPATIENT PHYSICAL THERAPY  UPPER EXTREMITY ONCOLOGY TREATMENT  Patient Name: CARRIE-ANN INABNIT MRN: [id number] DOB:09/02/1970, 53 y.o., female Today's Date: 08/31/2023  END OF SESSION:  PT End of Session - 08/31/23 0800     Visit Number 13    Number of Visits 21    Date for PT Re-Evaluation 10/06/23    Authorization Type [employer]    Authorization Time Period none needed    PT Start Time 0800    PT Stop Time 0851    PT Time Calculation (min) 51 min    Activity Tolerance Patient tolerated treatment well    Behavior During Therapy WFL for tasks assessed/performed               Past Medical History:  Diagnosis Date   Pneumonia    PONV (postoperative nausea and vomiting)    Vision abnormalities    Past Surgical History:  Procedure Laterality Date   APPENDECTOMY     BREAST BIOPSY Left 01/12/2019   Pash   BREAST BIOPSY Left 12/21/2018   benign   BREAST CYST ASPIRATION Left    BREAST EXCISIONAL BIOPSY Left 06/22/2019   Axilla BX - Benign   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   HIP ARTHROSCOPY     MASS EXCISION Left 02/10/2019   Procedure: EXCISION OF LEFT BREAST MASS;  Surgeon: Lockie Rima, MD;  Location: [facility];  Service: General;  Laterality: Left;   SCAR REVISION Left 06/22/2019   Procedure: LEFT AXILLARY CONTRACTURE RELEASE;  Surgeon: Lockie Rima, MD;  Location: [facility];  Service: General;  Laterality: Left;   Patient Active Problem List   Diagnosis Date Noted   Numbness 02/04/2016   Cognitive changes 02/04/2016   Other fatigue 02/04/2016      REFERRING PROVIDER: Artemio Bilberry, MD  REFERRING DIAG: Lymphedema  THERAPY DIAG:  Lymphedema, not elsewhere classified  Aftercare following surgery for neoplasm  Stiffness of left shoulder, not elsewhere classified  Localized edema  ONSET DATE: Oct 2023 with recent exacerbation, 1 month ago(Jun 23, 2023)  Rationale for Evaluation and Treatment:  Rehabilitation  SUBJECTIVE:                                                                                                                                                                                           SUBJECTIVE STATEMENT:  Swelling still doing well.Cording is still doing well. I tried a regular bra over the weekend but it didn't fit properly so I know I am not ready for it yet.  EVAL: I got back from [place]  and realized I had not worn my  sleeve or compression bra on the plane. I didn't have any problems on my way there, but I wore my compression. Several days after getting home I developed a lot of swelling in the left breast with pain at medial arm and sensation of cording in the axilla. Even my abdomen feels bad now and its not changing. I have been doing my Flexitouch and it feels better, but in the am when I wake up its bothering me again. I am wearing the [place] bra and a foam pad on the medial side. I wore the sleeve alittle, but I am so achy and heavy it makes it worse. Iron is low and I am really tired.   PERTINENT HISTORY:  Pt had recent surgery for Stage II Endometriosis on Oct 31,2024. She had a prior history of removal of benign breast mass on 02/10/2019 with complication of seroma and clogged drain and possible infection. She also developed lymphedema in her left breast and axillary cording. She receieved extensive PT with MLD, compression, manual therapy and dry needling, but had a significant persistent cord that was painful and limited shoulder ROM. She underwent an excision of that contracted scar tissue on 06/22/2019 that included one node, and develped a seroma after that.   PAIN:  Are you having pain? No, not currently   PRECAUTIONS: Left UE lymphedema  RED FLAGS: None   WEIGHT BEARING RESTRICTIONS: No  FALLS:  Has patient fallen in last 6 months? 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  COGNITION: Overall cognitive status: Within functional limits for tasks assessed   PALPATION: Tenderness in axillary area of cording with tightness noted to run along the biceps  OBSERVATIONS / OTHER ASSESSMENTS: significant generalized left breast swelling and lateral trunk swelling, minimally enlarged pores, some medial fibrosis  SENSATION: Light touch:     POSTURE: WNL  UPPER EXTREMITY AROM/PROM:  A/PROM RIGHT   eval   Shoulder extension 63  Shoulder flexion 155  Shoulder abduction 177  Shoulder internal rotation   Shoulder external rotation     (Blank rows = not tested)  A/PROM LEFT   eval LEFT 08/25/2023  Shoulder extension 50   Shoulder flexion 150, cord, impingement 163  Shoulder abduction 149 154  Shoulder internal rotation    Shoulder external rotation      (Blank rows = not tested)  CERVICAL AROM: All within normal limits:   UPPER EXTREMITY STRENGTH:   LYMPHEDEMA ASSESSMENTS:   SURGERY TYPE/DATE: 02/10/2019 Removal of Benign Breast Mass, 06/22/2019 Excision of contracted scar , Endometriosis/hysterectomy surgery 03/04/2022   NUMBER OF LYMPH NODES REMOVED: 1 left axillary with excision of contracted scar   CHEMOTHERAPY: NO  RADIATION:NO  HORMONE TREATMENT: NO  INFECTIONS: [id number] seromas after surgery   LYMPHEDEMA ASSESSMENTS:   LANDMARK RIGHT  eval  At axilla  24.9  15 cm proximal to olecranon process   10 cm proximal to olecranon process 23.6  Olecranon process 21.6  15 cm proximal to ulnar styloid process   10 cm proximal to ulnar styloid process 19.2  Just proximal to ulnar styloid process 14.45   Across hand at thumb web space 18  At  base of 2nd digit 5.7  (Blank rows = not tested)  LANDMARK LEFT  eval  At axilla  25  15 cm proximal to olecranon process   10 cm proximal to olecranon process 22.6  Olecranon process 21.3  15 cm proximal to ulnar styloid process   10 cm proximal to ulnar styloid process 18.3  Just proximal to ulnar styloid process 14.4  Across hand at thumb web space 17.8  At base of 2nd digit 5.85  (Blank rows = not tested)   FUNCTIONAL TESTS:    GAIT: WNL      BREAST COMPLAINTS QUESTIONNAIRE (EVAL) Pain:8 Heaviness:10 Swollen feeling:10 Tense Skin:8 Redness:0 Bra Print:10 Size of Pores:1 Hard feeling: 5 Total:   52  /80 A Score over 9 indicates lymphedema issues in the breast BREAST COMPLAINTS QUESTIONNAIRE Pain:6 Heaviness:5 Swollen feeling:4 Tense Skin:2 Redness:0 Bra Print:9 Size of Pores:0 Hard feeling: 6 Total:    32 /80 A Score over 9 indicates lymphedema issues in the breast                                                                                       TREATMENT DATE:   08/31/2023 MFR in supine for cording release in from breast to axilla into upper arm following cord with arm OH at end range flex and abd using opposing pressure, S, and prolonged holds along breast cord that extends into axilla and medial upper arm, with pt holding breast medially for opposing pull;  MLD In supine: Short neck, superficial and deep abdominals, Rt and left axillary LN's and right pectoral nodes, intact anterior thorax sequence and establishment of anterior inter-axillary pathway, Lt inguinal nodes and establishment of Lt axillo-inguinal pathway, then L breast moving fluid towards pathways spending extra time in any areas of fibrosis then retracing all steps, then finished retracing all steps in supine. Gave pt script for compression sleeve to take to [facility] 08/25/2023 Discussed progress and goals MFR in supine for cording release in  from breast to axilla into upper arm following cord with arm OH at end range flex and abd using opposing pressure, S, and prolonged holds along breast cord that extends into axilla and medial upper arm, with pt holding breast medially for opposing pull;  MLD In supine: Short neck, superficial and deep abdominals, Rt and left axillary LN's and right pectoral nodes, intact anterior thorax sequence and establishment of anterior inter-axillary pathway, Lt inguinal nodes and establishment of Lt axillo-inguinal pathway, then L breast moving fluid towards pathways spending extra time in any areas of fibrosis then retracing all steps, then finished retracing all steps in supine. Discussed compression garments; pt requires a script from MD for Juzo soft size 1 class 1 Bird Henna; beige sleeve Script faxed to Dr. Bryson Carbine today  08/24/23: Manual Therapy MFR in supine for cording release in from breast to axilla into upper arm following cord with arm OH at end range flex and abd using opposing pressure and prolonged holds along breast cord that extends into axilla and medial upper arm, with pt holding breast medially for opposing pull; then into Rt S/L for continued MFR to cording with  arm in end ROM flex and resting on therapists shoulder.  MLD In supine: Short neck, superficial and deep abdominals, Rt and left axillary LN's and right pectoral nodes, intact anterior thorax sequence and establishment of anterior inter-axillary pathway, Lt inguinal nodes and establishment of Lt axillo-inguinal pathway, then L breast moving fluid towards pathways spending extra time in any areas of fibrosis then retracing all steps, then finished retracing all steps in supine.  08/19/23: Manual Therapy MFR in supine for cording release in from breast to axilla into upper arm following cord with arm OH at end range flex and abd using opposing pressure and prolonged holds along breast cord that extends into axilla and medial upper arm,  with pt holding breast medially for opposing pull; then into Rt S/L for continued MFR to cording with arm in end ROM flex and resting on therapists shoulder.  MLD In supine: Short neck, superficial and deep abdominals, Rt and left axillary LN's and right pectoral nodes, intact anterior thorax sequence and establishment of anterior inter-axillary pathway, Lt inguinal nodes and establishment of Lt axillo-inguinal pathway, then L breast moving fluid towards pathways spending extra time in any areas of fibrosis then retracing all steps, then finished retracing all steps in supine. Kinesiotape: Removed tape from last session and reapplied same but slightly posterior to decrease irritation at skin.   08/17/23: Manual Therapy MFR in supine for cording release in from breast to axilla into upper arm following cord with arm propped overhead on pillow position using opposing pressure, "S" technique  and prolonged holds along breast cord that extends into axilla and medial upper arm, with pt holding breast medially for opposing pull.  MLD In supine: Short neck, superficial and deep abdominals, Rt and left axillary LN's and right pectoral nodes, intact anterior thorax sequence and establishment of anterior inter-axillary pathway, Lt inguinal nodes and establishment of Lt axillo-inguinal pathway, then L breast moving fluid towards pathways spending extra time in any areas of fibrosis then retracing all steps, then finished retracing all steps in supine. Bilateral breasts observed post rx; left breast swelling improved but still considerably swollen. Discussed wearing compression bra at night if she can be comfortable. Trial of kinesiotape along Lt axillo-inguinal anastomosis: bucket placed inferior to transverse watershed and 2 fingers running along lateral trunk to lateral breast. Pt is familiar with kinesiotape so knows to remove if noting skin irritation or reaction.   08/11/2023 MFR in supine for cording release in  from breast to axilla into upper arm following cord with arm propped overhead on pillow position using opposing pressure, "S" technique  and prolonged holds along breast cord that extends into axilla and medial upper arm MLD In supine: Short neck, superficial and deep abdominals, Rt and left axillary LN's and right pectoral nodes, intact anterior thorax sequence and establishment of anterior inter-axillary pathway, Lt inguinal nodes and establishment of Lt axillo-inguinal pathway, then L breast moving fluid towards pathways spending extra time in any areas of fibrosis then retracing all steps, then finished retracing all steps in supine.   08/10/23: Manual Therapy MFR in supine for cording release in from breast to axilla into upper arm following cord with arm propped overhead on pillow position using opposing pressure, "S" technique  and prolonged holds along breast cord that extends into axilla and medial upper arm, with pt holding breast medially for opposing pull.  MLD In supine: Short neck, superficial and deep abdominals, Rt and left axillary LN's and right pectoral nodes, intact anterior thorax  sequence and establishment of anterior inter-axillary pathway, Lt inguinal nodes and establishment of Lt axillo-inguinal pathway, then L breast moving fluid towards pathways spending extra time in any areas of fibrosis then retracing all steps, then finished retracing all steps in supine. Bilateral breasts observed post rx; left breast swelling improved but still considerably swollen. Discussed wearing compression bra at night if she can be comfortable.       PATIENT EDUCATION:  Education details: compression bra and need for new ones, may wear at night to see if am pain is better, gave script Person educated: Patient Education method: Explanation Education comprehension: verbalized understanding  HOME EXERCISE PROGRAM:   ASSESSMENT:  CLINICAL IMPRESSION: Pt continues with left breast swelling  as evidenced by inability to fit into her regular bra. Cording still tight but not too painful. Pt was given script to take to [facility] for her compression sleeves.  OBJECTIVE IMPAIRMENTS: decreased knowledge of condition, increased edema, and impaired UE functional use.   ACTIVITY LIMITATIONS: sleeping, reach over head, and caring for others  PARTICIPATION LIMITATIONS:  exercising  PERSONAL FACTORS:  1-2 comorbidities: Breast and Left axillary surgery and recent abdominal surgery  are also affecting patient's functional outcome.  are also affecting patient's functional outcome.   REHAB POTENTIAL: Excellent  CLINICAL DECISION MAKING: Stable/uncomplicated  EVALUATION COMPLEXITY: Low  GOALS: Goals reviewed with patient? Yes  SHORT TERM GOALS: Target date: 08/12/2023  Pt will report decreased breast pain and arm pain by 25% Baseline: Goal status: MET 08/25/2023 2.  Breast complaints questionnaire will improve to 40 to demonstrate improvement Baseline:  Goal status: MET 08/25/2023 (32) 3.  Pt will be independent with left breast MLD to decrease swelling Baseline:  Goal status: MET 08/25/2023   LONG TERM GOALS: Target date: 09/02/2023  Pt will have decreased left breast and UE pain by greater than 50% Baseline:  Goal status: MET 08/25/2023 2.  Pt will have breast complaints questionnaire no greater than 25 to demonstrate improved swelling Baseline:  Goal status: In Progress 3.  Pt will have appropriate compression garments for her arm and breast Baseline:  Goal status: MET 08/25/2023  4.  Pt will have left shoulder abd 165 for improved reaching Baseline:  Goal status: In Progress 5.  Pt will have improved left breast swelling by 50% or greater Baseline:  Goal status: MET 08/25/2023   PLAN:  PT FREQUENCY:1- 2x/week or 9 visits prn  PT DURATION: 6 weeks  PLANNED INTERVENTIONS: 97164- PT Re-evaluation, 97110-Therapeutic exercises, 97530- Therapeutic activity, 97112-  Neuromuscular re-education, 97535- Self Care, 16109- Manual therapy, and 97760- Orthotic Fit/training  PLAN FOR NEXT SESSION: MFR left axillary cording in varying positions, Cont MLD to left breast and arm and start continue review  with pt; cont kinesiotape if pt conts to feel beneficial.  Latisha Poland, PT 08/31/2023, 8:52 AM

## 2023-09-01 ENCOUNTER — Ambulatory Visit

## 2023-09-01 DIAGNOSIS — I890 Lymphedema, not elsewhere classified: Secondary | ICD-10-CM | POA: Diagnosis not present

## 2023-09-01 DIAGNOSIS — R600 Localized edema: Secondary | ICD-10-CM

## 2023-09-01 DIAGNOSIS — Z483 Aftercare following surgery for neoplasm: Secondary | ICD-10-CM

## 2023-09-01 DIAGNOSIS — M25612 Stiffness of left shoulder, not elsewhere classified: Secondary | ICD-10-CM

## 2023-09-01 NOTE — Therapy (Signed)
 OUTPATIENT PHYSICAL THERAPY  UPPER EXTREMITY ONCOLOGY TREATMENT  Patient Name: Sheena Jarvis MRN: [id number] DOB:[birth year], 53 y.o., female Today's Date: 09/01/2023  END OF SESSION:  PT End of Session - 09/01/23 0802     Visit Number 14    Number of Visits 21    Date for PT Re-Evaluation 10/06/23    Authorization Type [employer]    Authorization Time Period none needed    PT Start Time 0802    PT Stop Time 0855    PT Time Calculation (min) 53 min    Activity Tolerance Patient tolerated treatment well    Behavior During Therapy [facility] for tasks assessed/performed               Past Medical History:  Diagnosis Date   Pneumonia    PONV (postoperative nausea and vomiting)    Vision abnormalities    Past Surgical History:  Procedure Laterality Date   APPENDECTOMY     BREAST BIOPSY Left 01/12/2019   Pash   BREAST BIOPSY Left 12/21/2018   benign   BREAST CYST ASPIRATION Left    BREAST EXCISIONAL BIOPSY Left 06/22/2019   Axilla BX - Benign   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   HIP ARTHROSCOPY     MASS EXCISION Left 02/10/2019   Procedure: EXCISION OF LEFT BREAST MASS;  Surgeon: Lockie Rima, MD;  Location: [facility];  Service: General;  Laterality: Left;   SCAR REVISION Left 06/22/2019   Procedure: LEFT AXILLARY CONTRACTURE RELEASE;  Surgeon: Lockie Rima, MD;  Location: [facility];  Service: General;  Laterality: Left;   Patient Active Problem List   Diagnosis Date Noted   Numbness 02/04/2016   Cognitive changes 02/04/2016   Other fatigue 02/04/2016      REFERRING PROVIDER: Artemio Bilberry, MD  REFERRING DIAG: Lymphedema  THERAPY DIAG:  Lymphedema, not elsewhere classified  Aftercare following surgery for neoplasm  Stiffness of left shoulder, not elsewhere classified  Localized edema  ONSET DATE: Oct 2023 with recent exacerbation, 1 month ago(Jun 23, 2023)  Rationale for Evaluation and Treatment:  Rehabilitation  SUBJECTIVE:                                                                                                                                                                                           SUBJECTIVE STATEMENT:  Did OK after last visit. Steadily better. They sent the new garments for my pump.They were covered under warranty.  EVAL: I got back from [place]  and realized I had not worn my sleeve or compression bra on the plane. I didn't have any problems  on my way there, but I wore my compression. Several days after getting home I developed a lot of swelling in the left breast with pain at medial arm and sensation of cording in the axilla. Even my abdomen feels bad now and its not changing. I have been doing my Flexitouch and it feels better, but in the am when I wake up its bothering me again. I am wearing the [place] bra and a foam pad on the medial side. I wore the sleeve alittle, but I am so achy and heavy it makes it worse. Iron is low and I am really tired.   PERTINENT HISTORY:  Pt had recent surgery for Stage II Endometriosis on Oct 31,2024. She had a prior history of removal of benign breast mass on 02/10/2019 with complication of seroma and clogged drain and possible infection. She also developed lymphedema in her left breast and axillary cording. She receieved extensive PT with MLD, compression, manual therapy and dry needling, but had a significant persistent cord that was painful and limited shoulder ROM. She underwent an excision of that contracted scar tissue on 06/22/2019 that included one node, and develped a seroma after that.   PAIN:  Are you having pain? Yes, Still have the nipple nerve pain, 1-2/10 that is pretty constant until the swelling goes all the way away.  PRECAUTIONS: Left UE lymphedema  RED FLAGS: None   WEIGHT BEARING RESTRICTIONS: No  FALLS:  Has patient fallen in last 6 months? 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  COGNITION: Overall cognitive status: Within functional limits for tasks assessed   PALPATION: Tenderness in axillary area of cording with tightness noted to run along the biceps  OBSERVATIONS / OTHER ASSESSMENTS: significant generalized left breast swelling and lateral trunk swelling, minimally enlarged pores, some medial fibrosis  SENSATION: Light touch:     POSTURE: WNL  UPPER EXTREMITY AROM/PROM:  A/PROM RIGHT   eval   Shoulder extension 63  Shoulder flexion 155  Shoulder abduction 177  Shoulder internal rotation   Shoulder external rotation     (Blank rows = not tested)  A/PROM LEFT   eval LEFT 08/25/2023  Shoulder extension 50   Shoulder flexion 150, cord, impingement 163  Shoulder abduction 149 154  Shoulder internal rotation    Shoulder external rotation      (Blank rows = not tested)  CERVICAL AROM: All within normal limits:   UPPER EXTREMITY STRENGTH:   LYMPHEDEMA ASSESSMENTS:   SURGERY TYPE/DATE: 02/10/2019 Removal of Benign Breast Mass, 06/22/2019 Excision of contracted scar , Endometriosis/hysterectomy surgery 03/04/2022   NUMBER OF LYMPH NODES REMOVED: 1 left axillary with excision of contracted scar   CHEMOTHERAPY: NO  RADIATION:NO  HORMONE TREATMENT: NO  INFECTIONS: [id number] seromas after surgery   LYMPHEDEMA ASSESSMENTS:   LANDMARK RIGHT  eval  At axilla  24.9  15 cm proximal to olecranon process   10 cm proximal to olecranon process 23.6  Olecranon process 21.6  15 cm proximal to ulnar styloid process   10 cm proximal to ulnar styloid process 19.2   Just proximal to ulnar styloid process 14.45  Across hand at thumb web  space 18  At base of 2nd digit 5.7  (Blank rows = not tested)  LANDMARK LEFT  eval  At axilla  25  15 cm proximal to olecranon process   10 cm proximal to olecranon process 22.6  Olecranon process 21.3  15 cm proximal to ulnar styloid process   10 cm proximal to ulnar styloid process 18.3  Just proximal to ulnar styloid process 14.4  Across hand at thumb web space 17.8  At base of 2nd digit 5.85  (Blank rows = not tested)   FUNCTIONAL TESTS:    GAIT: WNL      BREAST COMPLAINTS QUESTIONNAIRE (EVAL) Pain:8 Heaviness:10 Swollen feeling:10 Tense Skin:8 Redness:0 Bra Print:10 Size of Pores:1 Hard feeling: 5 Total:   52  /80 A Score over 9 indicates lymphedema issues in the breast BREAST COMPLAINTS QUESTIONNAIRE Pain:6 Heaviness:5 Swollen feeling:4 Tense Skin:2 Redness:0 Bra Print:9 Size of Pores:0 Hard feeling: 6 Total:    32 /80 A Score over 9 indicates lymphedema issues in the breast                                                                                       TREATMENT DATE:   08/31/2023  MFR in supine for cording release in from breast to axilla into upper arm following cord with arm OH at end range flex and abd using opposing pressure, S, and prolonged holds along breast cord that extends into axilla and medial upper arm  MLD In supine: Short neck, superficial and deep abdominals, Rt and left axillary LN's and right pectoral nodes, intact anterior thorax sequence and establishment of anterior inter-axillary pathway, Lt inguinal nodes and establishment of Lt axillo-inguinal pathway, then L breast moving fluid towards pathways spending extra time in any areas of fibrosis then retracing all steps, then finished retracing all steps in supine.  08/31/2023 MFR in supine for cording release in from breast to axilla into upper arm following cord with arm OH at end range flex and abd using  opposing pressure, S, and prolonged holds along breast cord that extends into axilla and medial upper arm, with pt holding breast medially for opposing pull;  MLD In supine: Short neck, superficial and deep abdominals, Rt and left axillary LN's and right pectoral nodes, intact anterior thorax sequence and establishment of anterior inter-axillary pathway, Lt inguinal nodes and establishment of Lt axillo-inguinal pathway, then L breast moving fluid towards pathways spending extra time in any areas of fibrosis then retracing all steps, then finished retracing all steps in supine. Gave pt script for compression sleeve to take to [facility] 08/25/2023 Discussed progress and goals MFR in supine for cording release in from breast to axilla into upper arm following cord with arm OH at end range flex and abd using opposing pressure, S, and prolonged holds along breast cord that extends into axilla and medial upper arm, with pt holding breast medially for opposing pull;  MLD In supine: Short neck, superficial and deep abdominals, Rt and left axillary LN's and right pectoral nodes, intact anterior thorax sequence and establishment of anterior inter-axillary pathway, Lt inguinal nodes and establishment of Lt axillo-inguinal pathway, then L  breast moving fluid towards pathways spending extra time in any areas of fibrosis then retracing all steps, then finished retracing all steps in supine. Discussed compression garments; pt requires a script from MD for Juzo soft size 1 class 1 Bird Henna; beige sleeve Script faxed to Dr. Bryson Carbine today  08/24/23: Manual Therapy MFR in supine for cording release in from breast to axilla into upper arm following cord with arm OH at end range flex and abd using opposing pressure and prolonged holds along breast cord that extends into axilla and medial upper arm, with pt holding breast medially for opposing pull; then into Rt S/L for continued MFR to cording with arm in end ROM flex  and resting on therapists shoulder.  MLD In supine: Short neck, superficial and deep abdominals, Rt and left axillary LN's and right pectoral nodes, intact anterior thorax sequence and establishment of anterior inter-axillary pathway, Lt inguinal nodes and establishment of Lt axillo-inguinal pathway, then L breast moving fluid towards pathways spending extra time in any areas of fibrosis then retracing all steps, then finished retracing all steps in supine.  08/19/23: Manual Therapy MFR in supine for cording release in from breast to axilla into upper arm following cord with arm OH at end range flex and abd using opposing pressure and prolonged holds along breast cord that extends into axilla and medial upper arm, with pt holding breast medially for opposing pull; then into Rt S/L for continued MFR to cording with arm in end ROM flex and resting on therapists shoulder.  MLD In supine: Short neck, superficial and deep abdominals, Rt and left axillary LN's and right pectoral nodes, intact anterior thorax sequence and establishment of anterior inter-axillary pathway, Lt inguinal nodes and establishment of Lt axillo-inguinal pathway, then L breast moving fluid towards pathways spending extra time in any areas of fibrosis then retracing all steps, then finished retracing all steps in supine. Kinesiotape: Removed tape from last session and reapplied same but slightly posterior to decrease irritation at skin.   08/17/23: Manual Therapy MFR in supine for cording release in from breast to axilla into upper arm following cord with arm propped overhead on pillow position using opposing pressure, "S" technique  and prolonged holds along breast cord that extends into axilla and medial upper arm, with pt holding breast medially for opposing pull.  MLD In supine: Short neck, superficial and deep abdominals, Rt and left axillary LN's and right pectoral nodes, intact anterior thorax sequence and establishment of anterior  inter-axillary pathway, Lt inguinal nodes and establishment of Lt axillo-inguinal pathway, then L breast moving fluid towards pathways spending extra time in any areas of fibrosis then retracing all steps, then finished retracing all steps in supine. Bilateral breasts observed post rx; left breast swelling improved but still considerably swollen. Discussed wearing compression bra at night if she can be comfortable. Trial of kinesiotape along Lt axillo-inguinal anastomosis: bucket placed inferior to transverse watershed and 2 fingers running along lateral trunk to lateral breast. Pt is familiar with kinesiotape so knows to remove if noting skin irritation or reaction.   08/11/2023 MFR in supine for cording release in from breast to axilla into upper arm following cord with arm propped overhead on pillow position using opposing pressure, "S" technique  and prolonged holds along breast cord that extends into axilla and medial upper arm MLD In supine: Short neck, superficial and deep abdominals, Rt and left axillary LN's and right pectoral nodes, intact anterior thorax sequence and establishment of anterior inter-axillary  pathway, Lt inguinal nodes and establishment of Lt axillo-inguinal pathway, then L breast moving fluid towards pathways spending extra time in any areas of fibrosis then retracing all steps, then finished retracing all steps in supine.   08/10/23: Manual Therapy MFR in supine for cording release in from breast to axilla into upper arm following cord with arm propped overhead on pillow position using opposing pressure, "S" technique  and prolonged holds along breast cord that extends into axilla and medial upper arm, with pt holding breast medially for opposing pull.  MLD In supine: Short neck, superficial and deep abdominals, Rt and left axillary LN's and right pectoral nodes, intact anterior thorax sequence and establishment of anterior inter-axillary pathway, Lt inguinal nodes and  establishment of Lt axillo-inguinal pathway, then L breast moving fluid towards pathways spending extra time in any areas of fibrosis then retracing all steps, then finished retracing all steps in supine. Bilateral breasts observed post rx; left breast swelling improved but still considerably swollen. Discussed wearing compression bra at night if she can be comfortable.       PATIENT EDUCATION:  Education details: compression bra and need for new ones, may wear at night to see if am pain is better, gave script Person educated: Patient Education method: Explanation Education comprehension: verbalized understanding  HOME EXERCISE PROGRAM:   ASSESSMENT:  CLINICAL IMPRESSION: Pt continues cording shich extends into left breast and is likely responsible for nipple pain that is present. Swelling overall improved but not back to normal yet.  OBJECTIVE IMPAIRMENTS: decreased knowledge of condition, increased edema, and impaired UE functional use.   ACTIVITY LIMITATIONS: sleeping, reach over head, and caring for others  PARTICIPATION LIMITATIONS:  exercising  PERSONAL FACTORS:  1-2 comorbidities: Breast and Left axillary surgery and recent abdominal surgery  are also affecting patient's functional outcome.  are also affecting patient's functional outcome.   REHAB POTENTIAL: Excellent  CLINICAL DECISION MAKING: Stable/uncomplicated  EVALUATION COMPLEXITY: Low  GOALS: Goals reviewed with patient? Yes  SHORT TERM GOALS: Target date: 08/12/2023  Pt will report decreased breast pain and arm pain by 25% Baseline: Goal status: MET 08/25/2023 2.  Breast complaints questionnaire will improve to 40 to demonstrate improvement Baseline:  Goal status: MET 08/25/2023 (32) 3.  Pt will be independent with left breast MLD to decrease swelling Baseline:  Goal status: MET 08/25/2023   LONG TERM GOALS: Target date: 09/02/2023  Pt will have decreased left breast and UE pain by greater than  50% Baseline:  Goal status: MET 08/25/2023 2.  Pt will have breast complaints questionnaire no greater than 25 to demonstrate improved swelling Baseline:  Goal status: In Progress 3.  Pt will have appropriate compression garments for her arm and breast Baseline:  Goal status: MET 08/25/2023  4.  Pt will have left shoulder abd 165 for improved reaching Baseline:  Goal status: In Progress 5.  Pt will have improved left breast swelling by 50% or greater Baseline:  Goal status: MET 08/25/2023   PLAN:  PT FREQUENCY:1- 2x/week or 9 visits prn  PT DURATION: 6 weeks  PLANNED INTERVENTIONS: 97164- PT Re-evaluation, 97110-Therapeutic exercises, 97530- Therapeutic activity, 97112- Neuromuscular re-education, 97535- Self Care, 16109- Manual therapy, and 97760- Orthotic Fit/training  PLAN FOR NEXT SESSION: MFR left axillary cording in varying positions, Cont MLD to left breast and arm and start continue review  with pt; cont kinesiotape if pt conts to feel beneficial.  Latisha Poland, PT 09/01/2023, 8:55 AM

## 2023-09-07 ENCOUNTER — Ambulatory Visit: Attending: Obstetrics and Gynecology

## 2023-09-07 DIAGNOSIS — I890 Lymphedema, not elsewhere classified: Secondary | ICD-10-CM | POA: Insufficient documentation

## 2023-09-07 DIAGNOSIS — R600 Localized edema: Secondary | ICD-10-CM | POA: Insufficient documentation

## 2023-09-07 DIAGNOSIS — M25612 Stiffness of left shoulder, not elsewhere classified: Secondary | ICD-10-CM | POA: Insufficient documentation

## 2023-09-07 DIAGNOSIS — Z483 Aftercare following surgery for neoplasm: Secondary | ICD-10-CM | POA: Insufficient documentation

## 2023-09-07 NOTE — Therapy (Signed)
 OUTPATIENT PHYSICAL THERAPY  UPPER EXTREMITY ONCOLOGY TREATMENT  Patient Name: Sheena Jarvis MRN: [id number] DOB:1970/05/30, 53 y.o., female Today's Date: 09/07/2023  END OF SESSION:  PT End of Session - 09/07/23 1505     Visit Number 15    Number of Visits 21    Date for PT Re-Evaluation 10/06/23    Authorization Type BCBS    Authorization Time Period none needed    PT Start Time 1503    PT Stop Time 1557    PT Time Calculation (min) 54 min    Activity Tolerance Patient tolerated treatment well    Behavior During Therapy WFL for tasks assessed/performed               Past Medical History:  Diagnosis Date   Pneumonia    PONV (postoperative nausea and vomiting)    Vision abnormalities    Past Surgical History:  Procedure Laterality Date   APPENDECTOMY     BREAST BIOPSY Left 01/12/2019   Pash   BREAST BIOPSY Left 12/21/2018   benign   BREAST CYST ASPIRATION Left    BREAST EXCISIONAL BIOPSY Left 06/22/2019   Axilla BX - Benign   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   HIP ARTHROSCOPY     MASS EXCISION Left 02/10/2019   Procedure: EXCISION OF LEFT BREAST MASS;  Surgeon: Lockie Rima, MD;  Location: [facility];  Service: General;  Laterality: Left;   SCAR REVISION Left 06/22/2019   Procedure: LEFT AXILLARY CONTRACTURE RELEASE;  Surgeon: Lockie Rima, MD;  Location: [facility];  Service: General;  Laterality: Left;   Patient Active Problem List   Diagnosis Date Noted   Numbness 02/04/2016   Cognitive changes 02/04/2016   Other fatigue 02/04/2016      REFERRING PROVIDER: Artemio Bilberry, MD  REFERRING DIAG: Lymphedema  THERAPY DIAG:  Lymphedema, not elsewhere classified  Aftercare following surgery for neoplasm  Stiffness of left shoulder, not elsewhere classified  Localized edema  ONSET DATE: Oct 2023 with recent exacerbation, 1 month ago(Jun 23, 2023)  Rationale for Evaluation and Treatment:  Rehabilitation  SUBJECTIVE:                                                                                                                                                                                           SUBJECTIVE STATEMENT:  The medial breast from the fibrosis stays at about 1-2/10. The new garment for my pump has a zipper now so that should last longer. I only feel the cording now with scapular protraction and shoulder elevation which causes the sharp pain into the nipple.  EVAL: I got back from [place]  and realized I had not worn my sleeve or compression bra on the plane. I didn't have any problems on my way there, but I wore my compression. Several days after getting home I developed a lot of swelling in the left breast with pain at medial arm and sensation of cording in the axilla. Even my abdomen feels bad now and its not changing. I have been doing my Flexitouch and it feels better, but in the am when I wake up its bothering me again. I am wearing the [employer] bra and a foam pad on the medial side. I wore the sleeve alittle, but I am so achy and heavy it makes it worse. Iron is low and I am really tired.   PERTINENT HISTORY:  Pt had recent surgery for Stage II Endometriosis on Oct 31,2024. She had a prior history of removal of benign breast mass on 02/10/2019 with complication of seroma and clogged drain and possible infection. She also developed lymphedema in her left breast and axillary cording. She receieved extensive PT with MLD, compression, manual therapy and dry needling, but had a significant persistent cord that was painful and limited shoulder ROM. She underwent an excision of that contracted scar tissue on 06/22/2019 that included one node, and develped a seroma after that.   PAIN:  Are you having pain? Yes, Still have the nipple nerve pain, 1-2/10 that is pretty constant until the swelling goes all the way away.  PRECAUTIONS: Left UE lymphedema  RED  FLAGS: None   WEIGHT BEARING RESTRICTIONS: No  FALLS:  Has patient fallen in last 6 months? 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  COGNITION: Overall cognitive status: Within functional limits for tasks assessed   PALPATION: Tenderness in axillary area of cording with tightness noted to run along the biceps  OBSERVATIONS / OTHER ASSESSMENTS: significant generalized left breast swelling and lateral trunk swelling, minimally enlarged pores, some medial fibrosis  SENSATION: Light touch:     POSTURE: WNL  UPPER EXTREMITY AROM/PROM:  A/PROM RIGHT   eval   Shoulder extension 63  Shoulder flexion 155  Shoulder abduction 177  Shoulder internal rotation   Shoulder external rotation     (Blank rows = not tested)  A/PROM LEFT   eval LEFT 08/25/2023  Shoulder extension 50   Shoulder flexion 150, cord, impingement 163  Shoulder abduction 149 154  Shoulder internal rotation    Shoulder external rotation      (Blank rows = not tested)  CERVICAL AROM: All within normal limits:   UPPER EXTREMITY STRENGTH:   LYMPHEDEMA ASSESSMENTS:   SURGERY TYPE/DATE: 02/10/2019 Removal of Benign Breast Mass, 06/22/2019 Excision of contracted scar , Endometriosis/hysterectomy surgery 03/04/2022   NUMBER OF LYMPH NODES REMOVED: 1 left axillary with excision of contracted scar   CHEMOTHERAPY: NO  RADIATION:NO  HORMONE TREATMENT: NO  INFECTIONS: [id number] seromas after surgery   LYMPHEDEMA ASSESSMENTS:   LANDMARK RIGHT  eval  At axilla  24.9  15 cm proximal to olecranon process   10 cm  proximal to olecranon process 23.6  Olecranon process 21.6  15 cm proximal to ulnar styloid  process   10 cm proximal to ulnar styloid process 19.2  Just proximal to ulnar styloid process 14.45  Across hand at thumb web space 18  At base of 2nd digit 5.7  (Blank rows = not tested)  LANDMARK LEFT  eval  At axilla  25  15 cm proximal to olecranon process   10 cm proximal to olecranon process 22.6  Olecranon process 21.3  15 cm proximal to ulnar styloid process   10 cm proximal to ulnar styloid process 18.3  Just proximal to ulnar styloid process 14.4  Across hand at thumb web space 17.8  At base of 2nd digit 5.85  (Blank rows = not tested)   FUNCTIONAL TESTS:    GAIT: WNL      BREAST COMPLAINTS QUESTIONNAIRE (EVAL) Pain:8 Heaviness:10 Swollen feeling:10 Tense Skin:8 Redness:0 Bra Print:10 Size of Pores:1 Hard feeling: 5 Total:   52  /80 A Score over 9 indicates lymphedema issues in the breast BREAST COMPLAINTS QUESTIONNAIRE Pain:6 Heaviness:5 Swollen feeling:4 Tense Skin:2 Redness:0 Bra Print:9 Size of Pores:0 Hard feeling: 6 Total:    32 /80 A Score over 9 indicates lymphedema issues in the breast                                                                                       TREATMENT DATE:   09/07/23: Manual Therapy MFR in supine for cording release in from breast to axilla into upper arm following cord with arm OH at end range flex and abd using opposing pressure (by pt for increased pull of cord), S, and prolonged holds along breast cord that extends into axilla and medial upper arm MLD In supine: Short neck, superficial and deep abdominals, Rt axillary and pectoral lymph nodes, intact anterior thorax sequence and establishment of anterior inter-axillary pathway, Lt inguinal nodes and establishment of Lt axillo-inguinal pathway, then L breast moving fluid towards pathways spending extra time at medial breast that is now much softer than at beginning of  POC, then finished retracing all steps in supine.  09/01/2023  MFR in supine for cording release in from breast to axilla into upper arm following cord with arm OH at end range flex and abd using opposing pressure, S, and prolonged holds along breast cord that extends into axilla and medial upper arm  MLD In supine: Short neck, superficial and deep abdominals, Rt and left axillary LN's and right pectoral nodes, intact anterior thorax sequence and establishment of anterior inter-axillary pathway, Lt inguinal nodes and establishment of Lt axillo-inguinal pathway, then L breast moving fluid towards pathways spending extra time in any areas of fibrosis then retracing all steps, then finished retracing all steps in supine.  08/31/2023 MFR in supine for cording release in from breast to axilla into upper arm following cord with arm OH at end range flex and abd using opposing pressure, S, and prolonged holds along breast cord that extends into axilla and medial upper arm, with pt holding breast medially for opposing pull;  MLD In supine: Short neck, superficial and deep abdominals, Rt and left axillary LN's and right pectoral nodes, intact anterior thorax sequence and establishment of anterior inter-axillary pathway,  Lt inguinal nodes and establishment of Lt axillo-inguinal pathway, then L breast moving fluid towards pathways spending extra time in any areas of fibrosis then retracing all steps, then finished retracing all steps in supine. Gave pt script for compression sleeve to take to [facility]       PATIENT EDUCATION:  Education details: compression bra and need for new ones, may wear at night to see if am pain is better, gave script Person educated: Patient Education method: Explanation Education comprehension: verbalized understanding  HOME EXERCISE PROGRAM:   ASSESSMENT:  CLINICAL IMPRESSION: Continued improvement noted of medial Lt breast fibrosis. Cording, though some improved  since start of care, still present and extends towards breast. Pt reports noting improvement after manual therapy each session lessening the cording little by little.   OBJECTIVE IMPAIRMENTS: decreased knowledge of condition, increased edema, and impaired UE functional use.   ACTIVITY LIMITATIONS: sleeping, reach over head, and caring for others  PARTICIPATION LIMITATIONS:  exercising  PERSONAL FACTORS:  1-2 comorbidities: Breast and Left axillary surgery and recent abdominal surgery  are also affecting patient's functional outcome.  are also affecting patient's functional outcome.   REHAB POTENTIAL: Excellent  CLINICAL DECISION MAKING: Stable/uncomplicated  EVALUATION COMPLEXITY: Low  GOALS: Goals reviewed with patient? Yes  SHORT TERM GOALS: Target date: 08/12/2023  Pt will report decreased breast pain and arm pain by 25% Baseline: Goal status: MET 08/25/2023 2.  Breast complaints questionnaire will improve to 40 to demonstrate improvement Baseline:  Goal status: MET 08/25/2023 (32) 3.  Pt will be independent with left breast MLD to decrease swelling Baseline:  Goal status: MET 08/25/2023   LONG TERM GOALS: Target date: 09/02/2023  Pt will have decreased left breast and UE pain by greater than 50% Baseline:  Goal status: MET 08/25/2023 2.  Pt will have breast complaints questionnaire no greater than 25 to demonstrate improved swelling Baseline:  Goal status: In Progress 3.  Pt will have appropriate compression garments for her arm and breast Baseline:  Goal status: MET 08/25/2023  4.  Pt will have left shoulder abd 165 for improved reaching Baseline:  Goal status: In Progress 5.  Pt will have improved left breast swelling by 50% or greater Baseline:  Goal status: MET 08/25/2023   PLAN:  PT FREQUENCY:1- 2x/week or 9 visits prn  PT DURATION: 6 weeks  PLANNED INTERVENTIONS: 97164- PT Re-evaluation, 97110-Therapeutic exercises, 97530- Therapeutic activity, 97112-  Neuromuscular re-education, 97535- Self Care, 91478- Manual therapy, and 97760- Orthotic Fit/training  PLAN FOR NEXT SESSION: MFR left axillary cording in varying positions, Cont MLD to left breast and arm and start continue review  with pt; cont kinesiotape if pt conts to feel beneficial.  Denyce Flank, PTA 09/07/2023, 4:03 PM

## 2023-09-14 ENCOUNTER — Ambulatory Visit

## 2023-09-14 DIAGNOSIS — I890 Lymphedema, not elsewhere classified: Secondary | ICD-10-CM | POA: Diagnosis not present

## 2023-09-14 DIAGNOSIS — Z483 Aftercare following surgery for neoplasm: Secondary | ICD-10-CM

## 2023-09-14 DIAGNOSIS — M25612 Stiffness of left shoulder, not elsewhere classified: Secondary | ICD-10-CM

## 2023-09-14 DIAGNOSIS — R600 Localized edema: Secondary | ICD-10-CM

## 2023-09-14 NOTE — Therapy (Signed)
 OUTPATIENT PHYSICAL THERAPY  UPPER EXTREMITY ONCOLOGY TREATMENT  Patient Name: Sheena Jarvis MRN: [id number] DOB:1970/11/02, 53 y.o., female Today's Date: 09/14/2023  END OF SESSION:  PT End of Session - 09/14/23 0802     Visit Number 16    Number of Visits 21    Date for PT Re-Evaluation 10/06/23    Authorization Type BCBS    Authorization Time Period none needed    PT Start Time 0802    PT Stop Time 0855    PT Time Calculation (min) 53 min    Activity Tolerance Patient tolerated treatment well    Behavior During Therapy [facility] for tasks assessed/performed               Past Medical History:  Diagnosis Date   Pneumonia    PONV (postoperative nausea and vomiting)    Vision abnormalities    Past Surgical History:  Procedure Laterality Date   APPENDECTOMY     BREAST BIOPSY Left 01/12/2019   Pash   BREAST BIOPSY Left 12/21/2018   benign   BREAST CYST ASPIRATION Left    BREAST EXCISIONAL BIOPSY Left 06/22/2019   Axilla BX - Benign   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   HIP ARTHROSCOPY     MASS EXCISION Left 02/10/2019   Procedure: EXCISION OF LEFT BREAST MASS;  Surgeon: Lockie Rima, MD;  Location: [facility];  Service: General;  Laterality: Left;   SCAR REVISION Left 06/22/2019   Procedure: LEFT AXILLARY CONTRACTURE RELEASE;  Surgeon: Lockie Rima, MD;  Location: [facility];  Service: General;  Laterality: Left;   Patient Active Problem List   Diagnosis Date Noted   Numbness 02/04/2016   Cognitive changes 02/04/2016   Other fatigue 02/04/2016      REFERRING PROVIDER: Artemio Bilberry, MD  REFERRING DIAG: Lymphedema  THERAPY DIAG:  Lymphedema, not elsewhere classified  Aftercare following surgery for neoplasm  Stiffness of left shoulder, not elsewhere classified  Localized edema  ONSET DATE: Oct 2023 with recent exacerbation, 1 month ago(Jun 23, 2023)  Rationale for Evaluation and Treatment:  Rehabilitation  SUBJECTIVE:                                                                                                                                                                                           SUBJECTIVE STATEMENT:  I think I am getting to a good spot. It might be as good as it gets. Still get some sharpness into the breast with end range shoulder. Swelling better but not resolved. One more week may do it.   EVAL: I got back  from [place]  and realized I had not worn my sleeve or compression bra on the plane. I didn't have any problems on my way there, but I wore my compression. Several days after getting home I developed a lot of swelling in the left breast with pain at medial arm and sensation of cording in the axilla. Even my abdomen feels bad now and its not changing. I have been doing my Flexitouch and it feels better, but in the am when I wake up its bothering me again. I am wearing the [employer] bra and a foam pad on the medial side. I wore the sleeve alittle, but I am so achy and heavy it makes it worse. Iron is low and I am really tired.   PERTINENT HISTORY:  Pt had recent surgery for Stage II Endometriosis on Oct 31,2024. She had a prior history of removal of benign breast mass on 02/10/2019 with complication of seroma and clogged drain and possible infection. She also developed lymphedema in her left breast and axillary cording. She receieved extensive PT with MLD, compression, manual therapy and dry needling, but had a significant persistent cord that was painful and limited shoulder ROM. She underwent an excision of that contracted scar tissue on 06/22/2019 that included one node, and develped a seroma after that.   PAIN:  Are you having pain? None at restStill have the nipple nerve pain, 1-2/10 with movement  PRECAUTIONS: Left UE lymphedema  RED FLAGS: None   WEIGHT BEARING RESTRICTIONS: No  FALLS:  Has patient fallen in last 6 months? 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  COGNITION: Overall cognitive status: Within functional limits for tasks assessed   PALPATION: Tenderness in axillary area of cording with tightness noted to run along the biceps  OBSERVATIONS / OTHER ASSESSMENTS: significant generalized left breast swelling and lateral trunk swelling, minimally enlarged pores, some medial fibrosis  SENSATION: Light touch:     POSTURE: WNL  UPPER EXTREMITY AROM/PROM:  A/PROM RIGHT   eval   Shoulder extension 63  Shoulder flexion 155  Shoulder abduction 177  Shoulder internal rotation   Shoulder external rotation     (Blank rows = not tested)  A/PROM LEFT   eval LEFT 08/25/2023  Shoulder extension 50   Shoulder flexion 150, cord, impingement 163  Shoulder abduction 149 154  Shoulder internal rotation    Shoulder external rotation      (Blank rows = not tested)  CERVICAL AROM: All within normal limits:   UPPER EXTREMITY STRENGTH:   LYMPHEDEMA ASSESSMENTS:   SURGERY TYPE/DATE: 02/10/2019 Removal of Benign Breast Mass, 06/22/2019 Excision of contracted scar , Endometriosis/hysterectomy surgery 03/04/2022   NUMBER OF LYMPH NODES REMOVED: 1 left axillary with excision of contracted scar   CHEMOTHERAPY: NO  RADIATION:NO  HORMONE TREATMENT: NO  INFECTIONS: [id number] seromas after surgery   LYMPHEDEMA ASSESSMENTS:   LANDMARK RIGHT  eval  At axilla  24.9  15 cm proximal to olecranon process   10 cm proximal to olecranon process 23.6  Olecranon process 21.6  15 cm proximal to ulnar styloid process   10 cm  proximal to ulnar styloid process 19.2  Just  proximal to ulnar styloid process 14.45  Across hand at thumb web space 18  At base of 2nd digit 5.7  (Blank rows = not tested)  LANDMARK LEFT  eval  At axilla  25  15 cm proximal to olecranon process   10 cm proximal to olecranon process 22.6  Olecranon process 21.3  15 cm proximal to ulnar styloid process   10 cm proximal to ulnar styloid process 18.3  Just proximal to ulnar styloid process 14.4  Across hand at thumb web space 17.8  At base of 2nd digit 5.85  (Blank rows = not tested)   FUNCTIONAL TESTS:    GAIT: WNL      BREAST COMPLAINTS QUESTIONNAIRE (EVAL) Pain:8 Heaviness:10 Swollen feeling:10 Tense Skin:8 Redness:0 Bra Print:10 Size of Pores:1 Hard feeling: 5 Total:   52  /80 A Score over 9 indicates lymphedema issues in the breast BREAST COMPLAINTS QUESTIONNAIRE Pain:6 Heaviness:5 Swollen feeling:4 Tense Skin:2 Redness:0 Bra Print:9 Size of Pores:0 Hard feeling: 6 Total:    32 /80 A Score over 9 indicates lymphedema issues in the breast                                                                                       TREATMENT DATE:   09/14/2023 Manual Therapy MFR in supine for cording release in from breast to axilla into upper arm following cord with arm OH at approx 120 scaption using opposing pressure (by pt for increased pull of cord), S, and prolonged holds along breast cord that extends into axilla and medial upper arm MLD In supine: Short neck, superficial and deep abdominals, Rt axillary and pectoral lymph nodes, intact anterior thorax sequence and establishment of anterior inter-axillary pathway, Lt inguinal nodes and establishment of Lt axillo-inguinal pathway, then L breast moving fluid towards pathways spending extra time at medial breast that is now much softer than at beginning of POC, then finished retracing all steps in supine.  09/07/23: Manual Therapy MFR in supine for cording release  in from breast to axilla into upper arm following cord with arm OH at end range flex and abd using opposing pressure (by pt for increased pull of cord), S, and prolonged holds along breast cord that extends into axilla and medial upper arm MLD In supine: Short neck, superficial and deep abdominals, Rt axillary and pectoral lymph nodes, intact anterior thorax sequence and establishment of anterior inter-axillary pathway, Lt inguinal nodes and establishment of Lt axillo-inguinal pathway, then L breast moving fluid towards pathways spending extra time at medial breast that is now much softer than at beginning of POC, then finished retracing all steps in supine.  09/01/2023  MFR in supine for cording release in from breast to axilla into upper arm following cord with arm OH at end range flex and abd using opposing pressure, S, and prolonged holds along breast cord that extends into axilla and medial upper arm  MLD In supine: Short neck, superficial and deep abdominals, Rt and left axillary LN's and right pectoral nodes, intact anterior thorax sequence and establishment of anterior inter-axillary pathway, Lt inguinal nodes and establishment of Lt axillo-inguinal pathway, then L breast moving fluid  towards pathways spending extra time in any areas of fibrosis then retracing all steps, then finished retracing all steps in supine.  08/31/2023 MFR in supine for cording release in from breast to axilla into upper arm following cord with arm OH at end range flex and abd using opposing pressure, S, and prolonged holds along breast cord that extends into axilla and medial upper arm, with pt holding breast medially for opposing pull;  MLD In supine: Short neck, superficial and deep abdominals, Rt and left axillary LN's and right pectoral nodes, intact anterior thorax sequence and establishment of anterior inter-axillary pathway, Lt inguinal nodes and establishment of Lt axillo-inguinal pathway, then L breast moving fluid  towards pathways spending extra time in any areas of fibrosis then retracing all steps, then finished retracing all steps in supine. Gave pt script for compression sleeve to take to [facility]       PATIENT EDUCATION:  Education details: compression bra and need for new ones, may wear at night to see if am pain is better, gave script Person educated: Patient Education method: Explanation Education comprehension: verbalized understanding  HOME EXERCISE PROGRAM:   ASSESSMENT:  CLINICAL IMPRESSION: Minimal medial breast fibrosis, and good improvement in swelling. Still gets end range pain from cording with impingement type pain  OBJECTIVE IMPAIRMENTS: decreased knowledge of condition, increased edema, and impaired UE functional use.   ACTIVITY LIMITATIONS: sleeping, reach over head, and caring for others  PARTICIPATION LIMITATIONS: exercising  PERSONAL FACTORS:  1-2 comorbidities: Breast and Left axillary surgery and recent abdominal surgery  are also affecting patient's functional outcome.  are also affecting patient's functional outcome.   REHAB POTENTIAL: Excellent  CLINICAL DECISION MAKING: Stable/uncomplicated  EVALUATION COMPLEXITY: Low  GOALS: Goals reviewed with patient? Yes  SHORT TERM GOALS: Target date: 08/12/2023  Pt will report decreased breast pain and arm pain by 25% Baseline: Goal status: MET 08/25/2023 2.  Breast complaints questionnaire will improve to 40 to demonstrate improvement Baseline:  Goal status: MET 08/25/2023 (32) 3.  Pt will be independent with left breast MLD to decrease swelling Baseline:  Goal status: MET 08/25/2023   LONG TERM GOALS: Target date: 09/02/2023  Pt will have decreased left breast and UE pain by greater than 50% Baseline:  Goal status: MET 08/25/2023 2.  Pt will have breast complaints questionnaire no greater than 25 to demonstrate improved swelling Baseline:  Goal status: In Progress 3.  Pt will have appropriate  compression garments for her arm and breast Baseline:  Goal status: MET 08/25/2023  4.  Pt will have left shoulder abd 165 for improved reaching Baseline:  Goal status: In Progress 5.  Pt will have improved left breast swelling by 50% or greater Baseline:  Goal status: MET 08/25/2023   PLAN:  PT FREQUENCY:1- 2x/week or 9 visits prn  PT DURATION: 6 weeks  PLANNED INTERVENTIONS: 97164- PT Re-evaluation, 97110-Therapeutic exercises, 97530- Therapeutic activity, 97112- Neuromuscular re-education, 97535- Self Care, 16109- Manual therapy, and 97760- Orthotic Fit/training  PLAN FOR NEXT SESSION: MFR left axillary cording in varying positions, Cont MLD to left breast and arm and start continue review  with pt; cont kinesiotape if pt conts to feel beneficial.  Latisha Poland, PT 09/14/2023, 8:56 AM

## 2023-09-21 ENCOUNTER — Ambulatory Visit

## 2023-09-21 DIAGNOSIS — R600 Localized edema: Secondary | ICD-10-CM

## 2023-09-21 DIAGNOSIS — I890 Lymphedema, not elsewhere classified: Secondary | ICD-10-CM

## 2023-09-21 DIAGNOSIS — Z483 Aftercare following surgery for neoplasm: Secondary | ICD-10-CM

## 2023-09-21 DIAGNOSIS — M25612 Stiffness of left shoulder, not elsewhere classified: Secondary | ICD-10-CM

## 2023-09-21 NOTE — Therapy (Signed)
 OUTPATIENT PHYSICAL THERAPY  UPPER EXTREMITY ONCOLOGY TREATMENT  Patient Name: Sheena Jarvis MRN: [id number] DOB:11-11-1970, 53 y.o., female Today's Date: 09/21/2023  END OF SESSION:  PT End of Session - 09/21/23 0759     Visit Number 17    Number of Visits 21    Date for PT Re-Evaluation 10/06/23    Authorization Type [employer]    PT Start Time 0801    PT Stop Time 0855    PT Time Calculation (min) 54 min    Activity Tolerance Patient tolerated treatment well    Behavior During Therapy [facility] for tasks assessed/performed               Past Medical History:  Diagnosis Date   Pneumonia    PONV (postoperative nausea and vomiting)    Vision abnormalities    Past Surgical History:  Procedure Laterality Date   APPENDECTOMY     BREAST BIOPSY Left 01/12/2019   Pash   BREAST BIOPSY Left 12/21/2018   benign   BREAST CYST ASPIRATION Left    BREAST EXCISIONAL BIOPSY Left 06/22/2019   Axilla BX - Benign   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   HIP ARTHROSCOPY     MASS EXCISION Left 02/10/2019   Procedure: EXCISION OF LEFT BREAST MASS;  Surgeon: Lockie Rima, MD;  Location: [facility];  Service: General;  Laterality: Left;   SCAR REVISION Left 06/22/2019   Procedure: LEFT AXILLARY CONTRACTURE RELEASE;  Surgeon: Lockie Rima, MD;  Location: [facility];  Service: General;  Laterality: Left;   Patient Active Problem List   Diagnosis Date Noted   Numbness 02/04/2016   Cognitive changes 02/04/2016   Other fatigue 02/04/2016      REFERRING PROVIDER: Artemio Bilberry, MD  REFERRING DIAG: Lymphedema  THERAPY DIAG:  Lymphedema, not elsewhere classified  Aftercare following surgery for neoplasm  Stiffness of left shoulder, not elsewhere classified  Localized edema  ONSET DATE: Oct 2023 with recent exacerbation, 1 month ago(Jun 23, 2023)  Rationale for Evaluation and Treatment: Rehabilitation  SUBJECTIVE:                                                                                                                                                                                            SUBJECTIVE STATEMENT:  I feel Like it may be as good as its going to get. I can be discharged today. I have my new bras, and sleeves and new garments for the pump. I feel like swelling is nearly normal, back to baseline. Cording pain is still present, but I can sit and not constantly  feel it now..  Breast swelling about 85% better.    EVAL: I got back from [place]  and realized I had not worn my sleeve or compression bra on the plane. I didn't have any problems on my way there, but I wore my compression. Several days after getting home I developed a lot of swelling in the left breast with pain at medial arm and sensation of cording in the axilla. Even my abdomen feels bad now and its not changing. I have been doing my Flexitouch and it feels better, but in the am when I wake up its bothering me again. I am wearing the [place] bra and a foam pad on the medial side. I wore the sleeve alittle, but I am so achy and heavy it makes it worse. Iron is low and I am really tired.   PERTINENT HISTORY:  Pt had recent surgery for Stage II Endometriosis on Oct 31,2024. She had a prior history of removal of benign breast mass on 02/10/2019 with complication of seroma and clogged drain and possible infection. She also developed lymphedema in her left breast and axillary cording. She receieved extensive PT with MLD, compression, manual therapy and dry needling, but had a significant persistent cord that was painful and limited shoulder ROM. She underwent an excision of that contracted scar tissue on 06/22/2019 that included one node, and develped a seroma after that.   PAIN:  Are you having pain? None at restStill have the nipple nerve pain, 1-3/10 with movement at end ranges from cording  PRECAUTIONS: Left UE lymphedema  RED FLAGS: None   WEIGHT BEARING  RESTRICTIONS: No  FALLS:  Has patient fallen in last 6 months? 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  COGNITION: Overall cognitive status: Within functional limits for tasks assessed   PALPATION: Tenderness in axillary area of cording with tightness noted to run along the biceps  OBSERVATIONS / OTHER ASSESSMENTS: significant generalized left breast swelling and lateral trunk swelling, minimally enlarged pores, some medial fibrosis  SENSATION: Light touch:     POSTURE: WNL  UPPER EXTREMITY AROM/PROM:  A/PROM RIGHT   eval   Shoulder extension 63  Shoulder flexion 155  Shoulder abduction 177  Shoulder internal rotation   Shoulder external rotation     (Blank rows = not tested)  A/PROM LEFT   eval LEFT 08/25/2023 LEFT 09/21/2023  Shoulder extension 50    Shoulder flexion 150, cord, impingement 163 164  Shoulder abduction 149 154 164  Shoulder internal rotation     Shoulder external rotation       (Blank rows = not tested)  CERVICAL AROM: All within normal limits:   UPPER EXTREMITY STRENGTH:   LYMPHEDEMA ASSESSMENTS:   SURGERY TYPE/DATE: 02/10/2019 Removal of Benign Breast Mass, 06/22/2019 Excision of contracted scar , Endometriosis/hysterectomy surgery 03/04/2022   NUMBER OF LYMPH NODES REMOVED: 1 left axillary with excision of contracted scar   CHEMOTHERAPY: NO  RADIATION:NO  HORMONE TREATMENT: NO  INFECTIONS: QMV:HQIONGEX seromas after surgery   LYMPHEDEMA ASSESSMENTS:   LANDMARK RIGHT  eval  At axilla  24.9  15 cm proximal to olecranon process   10 cm  proximal  to olecranon process 23.6  Olecranon process 21.6  15 cm proximal to ulnar styloid process   10 cm proximal to ulnar styloid process 19.2  Just proximal to ulnar styloid process 14.45  Across hand at thumb web space 18  At base of 2nd digit 5.7  (Blank rows = not tested)  LANDMARK LEFT  eval  At axilla  25  15 cm proximal to olecranon process   10 cm proximal to olecranon process 22.6  Olecranon process 21.3  15 cm proximal to ulnar styloid process   10 cm proximal to ulnar styloid process 18.3  Just proximal to ulnar styloid process 14.4  Across hand at thumb web space 17.8  At base of 2nd digit 5.85  (Blank rows = not tested)   FUNCTIONAL TESTS:    GAIT: WNL      BREAST COMPLAINTS QUESTIONNAIRE (EVAL) Pain:8 Heaviness:10 Swollen feeling:10 Tense Skin:8 Redness:0 Bra Print:10 Size of Pores:1 Hard feeling: 5 Total:   52  /80 A Score over 9 indicates lymphedema issues in the breast BREAST COMPLAINTS QUESTIONNAIRE Pain:6 Heaviness:5 Swollen feeling:4 Tense Skin:2 Redness:0 Bra Print:9 Size of Pores:0 Hard feeling: 6 Total:    32 /80 A Score over 9 indicates lymphedema issues in the breast  BREAST COMPLAINTS QUESTIONNAIRE(09/21/2023) Pain:1 Heaviness:1 Swollen feeling:1 Tense Skin:0 Redness:0 Bra Print:2 Size of Pores:0 Hard feeling: 2 Total:   7  /80 A Score over 9 indicates lymphedema issues in the breast                                                                                         TREATMENT DATE:  09/21/2023 Discussed progress toward goals/ DC Completed breast complaints survey Manual Therapy MFR in supine for cording release in from breast to axilla into upper arm following cord with arm OH at approx 120 scaption using opposing pressure (by pt for increased pull of cord), S, and prolonged holds along breast cord that extends into axilla and medial upper arm MLD In supine: Short neck, superficial and deep abdominals, Rt  axillary and pectoral lymph nodes, intact anterior thorax sequence and establishment of anterior inter-axillary pathway, Lt inguinal nodes and establishment of Lt axillo-inguinal pathway, then L breast moving fluid towards pathways spending extra time at medial breast that is now much softer than at beginning of POC, then finished retracing all steps in supine. Measured AROM left shoulder post rx    09/14/2023 Manual Therapy MFR in supine for cording release in from breast to axilla into upper arm following cord with arm OH at approx 120 scaption using opposing pressure (by pt for increased pull of cord), S, and prolonged holds along breast cord that extends into axilla and medial upper arm MLD In supine: Short neck, superficial and deep abdominals, Rt axillary and pectoral lymph nodes, intact anterior thorax sequence and establishment of anterior inter-axillary pathway, Lt inguinal nodes and establishment of Lt axillo-inguinal pathway, then L breast moving fluid towards pathways spending extra time at medial breast that is now much softer than at beginning of POC, then finished retracing all steps in supine.  09/07/23: Manual Therapy MFR in supine for cording release  in from breast to axilla into upper arm following cord with arm OH at end range flex and abd using opposing pressure (by pt for increased pull of cord), S, and prolonged holds along breast cord that extends into axilla and medial upper arm MLD In supine: Short neck, superficial and deep abdominals, Rt axillary and pectoral lymph nodes, intact anterior thorax sequence and establishment of anterior inter-axillary pathway, Lt inguinal nodes and establishment of Lt axillo-inguinal pathway, then L breast moving fluid towards pathways spending extra time at medial breast that is now much softer than at beginning of POC, then finished retracing all steps in supine.  09/01/2023  MFR in supine for cording release in from breast to axilla into upper  arm following cord with arm OH at end range flex and abd using opposing pressure, S, and prolonged holds along breast cord that extends into axilla and medial upper arm  MLD In supine: Short neck, superficial and deep abdominals, Rt and left axillary LN's and right pectoral nodes, intact anterior thorax sequence and establishment of anterior inter-axillary pathway, Lt inguinal nodes and establishment of Lt axillo-inguinal pathway, then L breast moving fluid towards pathways spending extra time in any areas of fibrosis then retracing all steps, then finished retracing all steps in supine.  08/31/2023 MFR in supine for cording release in from breast to axilla into upper arm following cord with arm OH at end range flex and abd using opposing pressure, S, and prolonged holds along breast cord that extends into axilla and medial upper arm, with pt holding breast medially for opposing pull;  MLD In supine: Short neck, superficial and deep abdominals, Rt and left axillary LN's and right pectoral nodes, intact anterior thorax sequence and establishment of anterior inter-axillary pathway, Lt inguinal nodes and establishment of Lt axillo-inguinal pathway, then L breast moving fluid towards pathways spending extra time in any areas of fibrosis then retracing all steps, then finished retracing all steps in supine. Gave pt script for compression sleeve to take to [facility]       PATIENT EDUCATION:  Education details: compression bra and need for new ones, may wear at night to see if am pain is better, gave script Person educated: Patient Education method: Explanation Education comprehension: verbalized understanding  HOME EXERCISE PROGRAM:   ASSESSMENT:  CLINICAL IMPRESSION: Pt achieved all established goals except LTG for left shoulder abd 165 degrees. She achieved 164 degrees today. She feels like she is back to her baseline and is able to manage things from here. Breast swelling 85% better.  Cording still present and noted at end ranges, but no longer having constant pain. She feels ready for discharge.  OBJECTIVE IMPAIRMENTS: decreased knowledge of condition, increased edema, and impaired UE functional use.   ACTIVITY LIMITATIONS: sleeping, reach over head, and caring for others  PARTICIPATION LIMITATIONS: exercising  PERSONAL FACTORS:  1-2 comorbidities: Breast and Left axillary surgery and recent abdominal surgery  are also affecting patient's functional outcome.  are also affecting patient's functional outcome.   REHAB POTENTIAL: Excellent  CLINICAL DECISION MAKING: Stable/uncomplicated  EVALUATION COMPLEXITY: Low  GOALS: Goals reviewed with patient? Yes  SHORT TERM GOALS: Target date: 08/12/2023  Pt will report decreased breast pain and arm pain by 25% Baseline: Goal status: MET 08/25/2023 2.  Breast complaints questionnaire will improve to 40 to demonstrate improvement Baseline:  Goal status: MET 08/25/2023 (32) 3.  Pt will be independent with left breast MLD to decrease swelling Baseline:  Goal status: MET 08/25/2023  LONG TERM GOALS: Target date: 09/02/2023  Pt will have decreased left breast and UE pain by greater than 50% Baseline:  Goal status: MET 08/25/2023 2.  Pt will have breast complaints questionnaire no greater than 25 to demonstrate improved swelling Baseline:  Goal status: MET, 09/21/2023 3.  Pt will have appropriate compression garments for her arm and breast Baseline:  Goal status: MET 08/25/2023  4.  Pt will have left shoulder abd 165 for improved reaching Baseline:  Goal status: NOT MET 164 5.  Pt will have improved left breast swelling by 50% or greater Baseline:  Goal status: MET 08/25/2023   PLAN:  PT FREQUENCY:1- 2x/week or 9 visits prn  PT DURATION: 6 weeks  PLANNED INTERVENTIONS: 97164- PT Re-evaluation, 97110-Therapeutic exercises, 97530- Therapeutic activity, 97112- Neuromuscular re-education, 97535- Self Care, 62952-  Manual therapy, and 97760- Orthotic Fit/training  PLAN FOR NEXT SESSION: Pt discharged to Home management PHYSICAL THERAPY DISCHARGE SUMMARY  Visits from Start of Care: 17  Current functional level related to goals / functional outcomes: Achieved all goals except abd 165   Remaining deficits: Cording remains, but ROM improves after manual techniques, did not achieve abd goal;lacking 1 degree to achieve goal   Education / Equipment: MLD   Patient agrees to discharge. Patient goals were nearly all met. Patient is being discharged due to meeting the stated rehab goals.and pt happy with progress.   Latisha Poland, PT 09/21/2023, 10:03 AM

## 2023-09-29 ENCOUNTER — Ambulatory Visit

## 2023-10-05 ENCOUNTER — Encounter

## 2023-10-21 ENCOUNTER — Encounter (HOSPITAL_BASED_OUTPATIENT_CLINIC_OR_DEPARTMENT_OTHER): Payer: Self-pay

## 2023-10-21 ENCOUNTER — Emergency Department (HOSPITAL_BASED_OUTPATIENT_CLINIC_OR_DEPARTMENT_OTHER)
Admission: EM | Admit: 2023-10-21 | Discharge: 2023-10-21 | Disposition: A | Discharge status: Home / Self Care | Attending: Emergency Medicine | Admitting: Emergency Medicine

## 2023-10-21 ENCOUNTER — Other Ambulatory Visit: Payer: Self-pay

## 2023-10-21 ENCOUNTER — Emergency Department (HOSPITAL_COMMUNITY)

## 2023-10-21 ENCOUNTER — Emergency Department (HOSPITAL_BASED_OUTPATIENT_CLINIC_OR_DEPARTMENT_OTHER)

## 2023-10-21 DIAGNOSIS — M549 Dorsalgia, unspecified: Secondary | ICD-10-CM

## 2023-10-21 DIAGNOSIS — M5124 Other intervertebral disc displacement, thoracic region: Secondary | ICD-10-CM | POA: Diagnosis not present

## 2023-10-21 DIAGNOSIS — M5030 Other cervical disc degeneration, unspecified cervical region: Secondary | ICD-10-CM

## 2023-10-21 DIAGNOSIS — Z91040 Latex allergy status: Secondary | ICD-10-CM | POA: Diagnosis not present

## 2023-10-21 DIAGNOSIS — M5020 Other cervical disc displacement, unspecified cervical region: Secondary | ICD-10-CM | POA: Insufficient documentation

## 2023-10-21 DIAGNOSIS — M542 Cervicalgia: Secondary | ICD-10-CM

## 2023-10-21 DIAGNOSIS — M5134 Other intervertebral disc degeneration, thoracic region: Secondary | ICD-10-CM

## 2023-10-21 LAB — BASIC METABOLIC PANEL WITH GFR
Anion gap: 11 (ref 5–15)
BUN: 15 mg/dL (ref 6–20)
CO2: 25 mmol/L (ref 22–32)
Calcium: 9.2 mg/dL (ref 8.9–10.3)
Chloride: 105 mmol/L (ref 98–111)
Creatinine, Ser: 0.54 mg/dL (ref 0.44–1.00)
GFR, Estimated: >60 mL/min (ref >60)
Glucose, Bld: 87 mg/dL (ref 70–99)
Potassium: 4.3 mmol/L (ref 3.5–5.1)
Sodium: 141 mmol/L (ref 135–145)

## 2023-10-21 LAB — CBC WITH DIFFERENTIAL/PLATELET
Abs Immature Granulocytes: 0.01 10*3/uL (ref 0.00–0.07)
Basophils Absolute: 0 10*3/uL (ref 0.0–0.1)
Basophils Relative: 0 %
Eosinophils Absolute: 0.2 10*3/uL (ref 0.0–0.5)
Eosinophils Relative: 3 %
HCT: 36.9 % (ref 36.0–46.0)
Hemoglobin: 12 g/dL (ref 12.0–15.0)
Immature Granulocytes: 0 %
Lymphocytes Relative: 15 %
Lymphs Abs: 0.9 10*3/uL (ref 0.7–4.0)
MCH: 31 pg (ref 26.0–34.0)
MCHC: 32.5 g/dL (ref 30.0–36.0)
MCV: 95.3 fL (ref 80.0–100.0)
Monocytes Absolute: 0.5 10*3/uL (ref 0.1–1.0)
Monocytes Relative: 8 %
Neutro Abs: 4.3 10*3/uL (ref 1.7–7.7)
Neutrophils Relative %: 74 %
Platelets: 212 10*3/uL (ref 150–400)
RBC: 3.87 MIL/uL (ref 3.87–5.11)
RDW: 12.4 % (ref 11.5–15.5)
WBC: 5.8 10*3/uL (ref 4.0–10.5)
nRBC: 0 % (ref 0.0–0.2)

## 2023-10-21 LAB — D-DIMER, QUANTITATIVE: D-Dimer, Quant: <0.27 ug{FEU}/mL (ref 0.00–0.50)

## 2023-10-21 MED ORDER — DIAZEPAM 5 MG PO TABS
5.0000 mg | ORAL_TABLET | Freq: Once | ORAL | Status: AC
  Administered 2023-10-21: 5 mg via ORAL
  Filled 2023-10-21: qty 1

## 2023-10-21 MED ORDER — KETOROLAC TROMETHAMINE 15 MG/ML IJ SOLN
15.0000 mg | Freq: Once | INTRAMUSCULAR | Status: AC
  Administered 2023-10-21: 15 mg via INTRAVENOUS
  Filled 2023-10-21: qty 1

## 2023-10-21 MED ORDER — OXYCODONE-ACETAMINOPHEN 5-325 MG PO TABS
1.0000 | ORAL_TABLET | Freq: Once | ORAL | Status: AC
  Administered 2023-10-21: 1 via ORAL
  Filled 2023-10-21: qty 1

## 2023-10-21 MED ORDER — IOHEXOL 350 MG/ML SOLN
100.0000 mL | Freq: Once | INTRAVENOUS | Status: AC | PRN
  Administered 2023-10-21: 75 mL via INTRAVENOUS

## 2023-10-21 MED ORDER — DIAZEPAM 5 MG PO TABS: 5.0000 mg | ORAL_TABLET | Freq: Two times a day (BID) | ORAL | 0 refills | Status: AC | PRN

## 2023-10-21 MED ORDER — METHYLPREDNISOLONE 4 MG PO TBPK: ORAL_TABLET | ORAL | 0 refills | Status: AC

## 2023-10-21 MED ORDER — OXYCODONE-ACETAMINOPHEN 5-325 MG PO TABS: 1.0000 | ORAL_TABLET | ORAL | 0 refills | Status: DC | PRN

## 2023-10-21 MED ORDER — OXYCODONE-ACETAMINOPHEN 5-325 MG PO TABS
1.0000 | ORAL_TABLET | ORAL | Status: DC | PRN
  Administered 2023-10-21: 1 via ORAL
  Filled 2023-10-21: qty 1

## 2023-10-21 MED ORDER — OXYCODONE-ACETAMINOPHEN 5-325 MG PO TABS: 1.0000 | ORAL_TABLET | Freq: Once | ORAL | Status: DC

## 2023-10-21 NOTE — ED Triage Notes (Signed)
 Pt reports severe headache, neck pain/stiffness starting gradually on Monday of last week. 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 Gradual progressive throughout the week. Any movement exacerbates pain (no improvement with ibuprofen or muscle relaxers). Denies fever.
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
## 2023-10-21 NOTE — ED Notes (Signed)
 Pt and family member notified that awaiting MRI results. This RN informed patient EDP will update patient with results when they are ready. Pt given warm compress for neck.

## 2023-10-21 NOTE — ED Provider Notes (Signed)
 3:10 PM Patient transferred from [facility] emergency department to get MRIs of neck and back.  The CT imaging was reassuring thus far.  Anticipate reassessment after MRIs.  8:33 PM MRI imaging returned showing multiple areas of disc bulging likely causing her symptoms.  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  She and I made a plan for management and will give her a last dose of Valium and pain medicine here and then she will be discharged to follow-up with outpatient neurosurgery.  Will give her a Medrol Dosepak, Valium, and some pain medicine.  Patient agrees with this plan.  She understands follow-up with neurosurgery and return precautions.  She had no questions or concerns and will be discharged for outpatient follow-up.   Clinical Impression: 1. Acute upper back pain   2. Cervical pain (neck)   3. 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 Bulge of cervical disc without myelopathy     Disposition: Discharge  Condition: Good  I have discussed the results, Dx and Tx plan with the pt(& family if present). He/she/they expressed understanding and agree(s) with the plan. Discharge instructions discussed at great length. Strict return precautions discussed and pt &/or family have verbalized understanding of the instructions. No further questions at time of discharge.    New Prescriptions   DIAZEPAM (VALIUM) 5 MG TABLET    Take 1 tablet (5 mg total) by mouth 2 (two) times daily as needed for muscle spasms.   METHYLPREDNISOLONE (MEDROL DOSEPAK) 4 MG TBPK TABLET    Please follow directions on Dosepak   OXYCODONE -ACETAMINOPHEN  (PERCOCET/ROXICET) 5-325 MG TABLET    Take 1 tablet by mouth every 4 (four) hours as needed for severe pain (pain score 7-10).    Follow Up: [place], [facility] [address] [place] [address] [phone]        Amariyana Heacox,  Marine Sia, MD 10/21/23 2033

## 2023-10-21 NOTE — Discharge Instructions (Signed)
 Your history, exam, and workup today revealed disc bulges likely causing your exquisite pain and spasms.  The MRI showed several areas of disc bulges but did not show central cord compression at this time.  Given your reassuring exam, we feel you are safe for discharge home but please use the medication still with symptoms and follow-up with a neurosurgeon.  Please rest and stay hydrated.  If any symptoms change or worsen acutely or evolve, return to the nearest emergency department.

## 2023-10-21 NOTE — ED Triage Notes (Signed)
 Pt states she has never had pain like this before. No history of migraines. Denies sensitivity to light/sound. Pt reports pain is radiating from head to back of neck and down into chest.

## 2023-10-21 NOTE — ED Notes (Signed)
 Patient here from [facility] for MRI.  Patient currently in waiting room, in NAD.

## 2023-10-23 ENCOUNTER — Other Ambulatory Visit: Payer: Self-pay

## 2023-10-28 ENCOUNTER — Emergency Department (HOSPITAL_COMMUNITY)

## 2023-10-28 ENCOUNTER — Emergency Department (HOSPITAL_COMMUNITY)
Admission: EM | Admit: 2023-10-28 | Discharge: 2023-10-28 | Disposition: A | Discharge status: Home / Self Care | Attending: Emergency Medicine | Admitting: Emergency Medicine

## 2023-10-28 ENCOUNTER — Encounter (HOSPITAL_COMMUNITY): Payer: Self-pay

## 2023-10-28 ENCOUNTER — Other Ambulatory Visit: Payer: Self-pay

## 2023-10-28 DIAGNOSIS — Z91040 Latex allergy status: Secondary | ICD-10-CM | POA: Insufficient documentation

## 2023-10-28 DIAGNOSIS — M7989 Other specified soft tissue disorders: Secondary | ICD-10-CM | POA: Diagnosis not present

## 2023-10-28 DIAGNOSIS — M25562 Pain in left knee: Secondary | ICD-10-CM | POA: Diagnosis present

## 2023-10-28 DIAGNOSIS — M25462 Effusion, left knee: Secondary | ICD-10-CM | POA: Diagnosis not present

## 2023-10-28 MED ORDER — OXYCODONE-ACETAMINOPHEN 5-325 MG PO TABS: 1.0000 | ORAL_TABLET | Freq: Four times a day (QID) | ORAL | 0 refills | Status: AC | PRN

## 2023-10-28 MED ORDER — OXYCODONE-ACETAMINOPHEN 5-325 MG PO TABS
1.0000 | ORAL_TABLET | Freq: Once | ORAL | Status: AC
  Administered 2023-10-28: 1 via ORAL
  Filled 2023-10-28: qty 1

## 2023-10-28 NOTE — Progress Notes (Signed)
 LLE venous duplex is complete.  Prelim can be found in CV Proc Elmarie Lindau, RVT

## 2023-10-28 NOTE — ED Provider Notes (Signed)
 [place] EMERGENCY DEPARTMENT AT [facility] Provider Note   CSN: [id number] Arrival date & time: 10/28/23  9071     Patient presents with: Knee Pain   Sheena Jarvis is a 53 y.o. female.   Patient is a 53 year old female with multiple orthopedic and musculoskeletal issues who had a known ruptured Baker's cyst approximately 2 weeks ago but also had ongoing issues with disc in her cervical spine who had been on a steroid taper who is presenting today due to severe left knee pain.  She reports that since being on the steroids the pain in her knee had been helping and she had been wearing a compressive stocking but reports she worked all day yesterday and did not wear the stocking and then this morning had significant pain and swelling in her left knee.  She denies any redness or fevers.  She was unable to walk.  The history is provided by the patient.  Knee Pain      Prior to Admission medications   Medication Sig Start Date End Date Taking? Authorizing Provider  oxyCODONE -acetaminophen  (PERCOCET/ROXICET) 5-325 MG tablet Take 1 tablet by mouth every 6 (six) hours as needed for severe pain (pain score 7-10). 10/28/23  Yes Leiana Rund, [place], MD  ARMOUR THYROID  30 MG tablet Take 30 mg by mouth every morning.    [provider]  Ascorbic Acid  (VITAMIN C) POWD Take 2,569 mg by mouth.    [provider]  ciclopirox  (LOPROX ) 0.77 % cream Apply topically 2 (two) times daily. Patient not taking: Reported on 07/23/2022 12/31/19   Elvie French HERO, PA-C  diazepam  (VALIUM ) 5 MG tablet Take 1 tablet (5 mg total) by mouth 2 (two) times daily as needed for muscle spasms. 10/21/23   Tegeler, Lonni PARAS, MD  Diclofenac  Sodium CR 100 MG 24 hr tablet Take 1 tablet (100 mg total) by mouth daily. Patient not taking: Reported on 07/23/2022 08/08/21   Palumbo, April, MD  estradiol  (VIVELLE -DOT) 0.075 MG/24HR Place 1 patch onto the skin 2 (two) times a week. 08/20/23   [provider]  fludrocortisone (FLORINEF) 0.1 MG tablet Take 100 mcg by mouth daily.    [provider]  hydrOXYzine  (ATARAX /VISTARIL ) 25 MG tablet Take 1 tablet (25 mg total) by mouth at bedtime as needed. Patient not taking: Reported on 07/23/2022 12/31/19   Elvie French HERO, PA-C  lidocaine  (LIDODERM ) 5 % Place 1 patch onto the skin daily. Remove & Discard patch within 12 hours or as directed by MD Patient not taking: Reported on 07/23/2022 08/08/21   Palumbo, April, MD  methylPREDNISolone (MEDROL DOSEPAK) 4 MG TBPK tablet Please follow directions on Dosepak 10/21/23   Tegeler, Lonni PARAS, MD  Multiple Vitamin (MULTIVITAMIN) capsule Take 1 capsule by mouth daily.    Curt Myla SAILOR, RN  Omega-3 Fatty Acids (FISH OIL) 1000 MG CAPS Take by mouth.    Lindner, Jodi N, RN  ondansetron  (ZOFRAN  ODT) 4 MG disintegrating tablet 4mg  ODT q4 hours prn nausea/vomit Patient not taking: Reported on 10/21/2023 12/23/20   Zammit, Joseph, MD  predniSONE  (DELTASONE ) 10 MG tablet Take 1 tablet (10 mg total) by mouth daily with breakfast. Days 1-4 take 4 tablets (40 mg) daily  Days 5-8 take 3 tablets (30 mg) daily, Days 9-11 take 2 tablets (20 mg) daily, Days 12-14 take 1 tablet (10 mg) daily. Patient not taking: Reported on 07/23/2022 12/17/19   Gibbons, Claudia J, PA-C  TURMERIC PO Take by mouth.  [provider]    Allergies: Penicillins, Tetracyclines & related, Latex, and Sulfa antibiotics    Review of Systems  Updated Vital Signs BP 92/62 (BP Location: Right Arm)   Pulse 90   Temp 98.2 F (36.8 C) (Oral)   Resp 18   Ht 5' 7 (1.702 m)   Wt 56.2 kg   LMP  (LMP Unknown)   SpO2 100%   BMI 19.41 kg/m   Physical Exam Vitals and nursing note reviewed.  Constitutional:      General: She is not in acute distress.    Appearance: She is well-developed.  HENT:     Head: Normocephalic and atraumatic.   Eyes:     Pupils: Pupils are equal, round, and reactive to light.     Cardiovascular:     Rate and Rhythm: Normal rate.     Pulses: Normal pulses.  Pulmonary:     Effort: Pulmonary effort is normal. No respiratory distress.  Abdominal:     General: Bowel sounds are normal.   Musculoskeletal:        General: Swelling and tenderness present. No signs of injury. Normal range of motion.     Left knee: Swelling and effusion present. Tenderness present over the medial joint line, lateral joint line and patellar tendon.     Left lower leg: Edema present.     Comments: No edema.  Significant effusion noted to the left knee.  Full range of motion is present.  Minimal warmth no erythema present   Skin:    General: Skin is warm and dry.     Findings: No rash.   Neurological:     Mental Status: She is alert and oriented to person, place, and time.     Cranial Nerves: No cranial nerve deficit.   Psychiatric:        Behavior: Behavior normal.     (all labs ordered are listed, but only abnormal results are displayed) Labs Reviewed - No data to display  EKG: None  Radiology: VAS US  LOWER EXTREMITY VENOUS (DVT) (7a-7p) Result Date: 10/28/2023  Lower Venous DVT Study Patient Name:  Sheena Jarvis  Date of Exam:   10/28/2023 Medical Rec #: [id number]        Accession #:    [id number] Date of Birth: Nov 16, 1970         Patient Gender: F Patient Age:   58 years Exam Location:  [facility] Procedure:      VAS US  LOWER EXTREMITY VENOUS (DVT) Referring Phys: BENTON SHONE --------------------------------------------------------------------------------  Indications: Swelling, and Edema. Other Indications: Hx of Lt ruptured Baker's cyst x 1 wk ago at [facility] per pt. Performing Technologist: Elmarie Lindau, RVT  Examination Guidelines: A complete evaluation includes B-mode imaging, spectral Doppler, color Doppler, and power Doppler as needed of all accessible portions of each vessel. Bilateral testing is considered an integral part of a complete examination. Limited  examinations for reoccurring indications may be performed as noted. The reflux portion of the exam is performed with the patient in reverse Trendelenburg.  +-----+---------------+---------+-----------+----------+--------------+ RIGHTCompressibilityPhasicitySpontaneityPropertiesThrombus Aging +-----+---------------+---------+-----------+----------+--------------+ CFV  Full           Yes      Yes                                 +-----+---------------+---------+-----------+----------+--------------+   +---------+---------------+---------+-----------+----------+--------------+ LEFT     CompressibilityPhasicitySpontaneityPropertiesThrombus Aging +---------+---------------+---------+-----------+----------+--------------+ CFV      Full  Yes      Yes                                 +---------+---------------+---------+-----------+----------+--------------+ SFJ      Full                                                        +---------+---------------+---------+-----------+----------+--------------+ FV Prox  Full                                                        +---------+---------------+---------+-----------+----------+--------------+ FV Mid   Full                                                        +---------+---------------+---------+-----------+----------+--------------+ FV DistalFull                                                        +---------+---------------+---------+-----------+----------+--------------+ PFV      Full                                                        +---------+---------------+---------+-----------+----------+--------------+ POP      Full           Yes      Yes                                 +---------+---------------+---------+-----------+----------+--------------+ PTV      Full                                                         +---------+---------------+---------+-----------+----------+--------------+ PERO     Full                                                        +---------+---------------+---------+-----------+----------+--------------+ There are multiple heterogeneous, nonvascular structures in the popliteal fossa measuring 4.47 x 1.30 cm in its maximum diameter.   Summary: RIGHT: - No evidence of common femoral vein obstruction.   LEFT: - There is no evidence of deep vein thrombosis in the lower extremity.  - A cystic structure is found in the popliteal fossa.  *See table(s) above for measurements and observations.    Preliminary  DG Knee 1-2 Views Left Result Date: 10/28/2023 CLINICAL DATA:  Knee pain EXAM: LEFT KNEE - 2 VIEW COMPARISON:  None Available. FINDINGS: No acute fracture or dislocation. Preserved joint spaces and bone mineralization. There is a large joint effusion. IMPRESSION: Large joint effusion. This has a broad differential. Please correlate for any history of trauma or infection. Further workup as clinically appropriate. Electronically Signed   By: Ranell Bring M.D.   On: 10/28/2023 11:19     Procedures   Medications Ordered in the ED  oxyCODONE -acetaminophen  (PERCOCET/ROXICET) 5-325 MG per tablet 1 tablet (1 tablet Oral Given 10/28/23 1029)                                    Medical Decision Making Amount and/or Complexity of Data Reviewed Radiology: ordered and independent interpretation performed. Decision-making details documented in ED Course.  Risk Prescription drug management.   Patient presenting with knee pain with a prior history of a ruptured Baker's cyst several weeks ago who has already been on steroid therapy who is presenting today due to significant worsening swelling of the knee.  Also having edema in her lower and mid leg.  Will rule out DVT and get a x-ray to evaluate for other acute pathology.  Knee effusion is obvious on exam.  Patient will need orthopedic  follow-up.  I have independently visualized and interpreted pt's images today.  Dopplers negative for DVT, x-ray without fracture but evidence of effusion.  Radiology reports a cystic structure was found in the popliteal fossa but no evidence of DVT and knee film with a large joint effusion concern for possible trauma which patient at this time denies.  She is not on anticoagulation that would suggest hemarthrosis.  No evidence concerning for infection at this time.  Discussed the results with the patient.  She has all the braces and ambulatory equipment at her disposal due to her job.  Encouraged her to follow-up with orthopedics.  She will continue NSAIDs.      Final diagnoses:  Effusion of left knee    ED Discharge Orders          Ordered    oxyCODONE -acetaminophen  (PERCOCET/ROXICET) 5-325 MG tablet  Every 6 hours PRN        10/28/23 1358               Doretha Folks, MD 10/28/23 1358

## 2023-10-28 NOTE — ED Notes (Signed)
 Pt. Did not want ice.  The knee is painful to touch

## 2023-10-28 NOTE — ED Triage Notes (Signed)
 Lt knee pain, began 2 weeks ago, ruptured baker's cyst,  Pt. Was on a steroid taper for her neck and completed this medication yesterday and woke up this am unable to bear weight.  Pain has increased. Lt. Knee swelling , no redness or warmth to touch.  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  Did work yesterday.

## 2023-10-28 NOTE — Discharge Instructions (Addendum)
 You do have a large effusion in your left knee that advisable that you follow-up with orthopedics that they potentially could drain the area.  You can need a steroid injection.  Continue with the anti-inflammatory medications.  Use a wrap if he can tolerate it.  Pain medication was sent to your pharmacy as well.

## 2023-10-29 ENCOUNTER — Other Ambulatory Visit: Payer: Self-pay | Admitting: Sports Medicine

## 2023-10-29 ENCOUNTER — Other Ambulatory Visit
Admission: RE | Admit: 2023-10-29 | Discharge: 2023-10-29 | Disposition: A | Discharge status: Home / Self Care | Source: Ambulatory Visit | Attending: Sports Medicine | Admitting: Sports Medicine

## 2023-10-29 DIAGNOSIS — M25462 Effusion, left knee: Secondary | ICD-10-CM | POA: Diagnosis present

## 2023-10-29 DIAGNOSIS — S8992XA Unspecified injury of left lower leg, initial encounter: Secondary | ICD-10-CM

## 2023-10-29 DIAGNOSIS — S83212A Bucket-handle tear of medial meniscus, current injury, left knee, initial encounter: Secondary | ICD-10-CM

## 2023-10-29 LAB — SYNOVIAL CELL COUNT + DIFF, W/ CRYSTALS
Crystals, Fluid: NONE SEEN
Eosinophils-Synovial: 0 %
Lymphocytes-Synovial Fld: 2 %
Monocyte-Macrophage-Synovial Fluid: 12 %
Neutrophil, Synovial: 86 %
WBC, Synovial: 16104 /mm3 — ABNORMAL HIGH (ref 0–200)

## 2023-10-30 ENCOUNTER — Ambulatory Visit
Admission: RE | Admit: 2023-10-30 | Discharge: 2023-10-30 | Disposition: A | Discharge status: Home / Self Care | Source: Ambulatory Visit | Attending: Sports Medicine

## 2023-10-30 DIAGNOSIS — S83212A Bucket-handle tear of medial meniscus, current injury, left knee, initial encounter: Secondary | ICD-10-CM | POA: Diagnosis present

## 2023-10-30 DIAGNOSIS — S8992XA Unspecified injury of left lower leg, initial encounter: Secondary | ICD-10-CM | POA: Insufficient documentation

## 2023-11-03 ENCOUNTER — Other Ambulatory Visit

## 2023-11-03 DIAGNOSIS — Z006 Encounter for examination for normal comparison and control in clinical research program: Secondary | ICD-10-CM

## 2023-11-17 LAB — GENECONNECT MOLECULAR SCREEN: Genetic Analysis Overall Interpretation: NEGATIVE
